# Patient Record
Sex: Female | Born: 1985 | Race: White | Hispanic: No | Marital: Married | Smoking: Current every day smoker
Health system: Southern US, Community
[De-identification: ages and names within clinical notes are randomized; demographics above are authoritative.]

## PROBLEM LIST (undated history)

## (undated) ENCOUNTER — Inpatient Hospital Stay: Payer: Self-pay

## (undated) ENCOUNTER — Inpatient Hospital Stay (HOSPITAL_COMMUNITY): Payer: Self-pay

## (undated) DIAGNOSIS — F17200 Nicotine dependence, unspecified, uncomplicated: Secondary | ICD-10-CM

## (undated) DIAGNOSIS — L0291 Cutaneous abscess, unspecified: Secondary | ICD-10-CM

## (undated) DIAGNOSIS — R519 Headache, unspecified: Secondary | ICD-10-CM

## (undated) DIAGNOSIS — N390 Urinary tract infection, site not specified: Secondary | ICD-10-CM

## (undated) DIAGNOSIS — L0390 Cellulitis, unspecified: Secondary | ICD-10-CM

## (undated) DIAGNOSIS — R011 Cardiac murmur, unspecified: Secondary | ICD-10-CM

## (undated) DIAGNOSIS — R51 Headache: Secondary | ICD-10-CM

## (undated) HISTORY — PX: NO PAST SURGERIES: SHX2092

## (undated) HISTORY — DX: Morbid (severe) obesity due to excess calories: E66.01

## (undated) HISTORY — DX: Nicotine dependence, unspecified, uncomplicated: F17.200

---
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
## 2004-01-07 ENCOUNTER — Encounter: Admission: RE | Admit: 2004-01-07 | Discharge: 2004-01-07 | Payer: Self-pay | Admitting: Family Medicine

## 2004-01-18 ENCOUNTER — Inpatient Hospital Stay (HOSPITAL_COMMUNITY): Admission: AD | Admit: 2004-01-18 | Discharge: 2004-01-18 | Payer: Self-pay | Admitting: Obstetrics and Gynecology

## 2004-01-21 ENCOUNTER — Encounter: Admission: RE | Admit: 2004-01-21 | Discharge: 2004-01-21 | Payer: Self-pay | Admitting: *Deleted

## 2004-02-04 ENCOUNTER — Encounter: Admission: RE | Admit: 2004-02-04 | Discharge: 2004-02-04 | Payer: Self-pay | Admitting: *Deleted

## 2004-02-11 ENCOUNTER — Encounter: Admission: RE | Admit: 2004-02-11 | Discharge: 2004-02-11 | Payer: Self-pay | Admitting: Obstetrics & Gynecology

## 2004-02-13 ENCOUNTER — Inpatient Hospital Stay (HOSPITAL_COMMUNITY): Admission: AD | Admit: 2004-02-13 | Discharge: 2004-02-15 | Payer: Self-pay | Admitting: *Deleted

## 2004-09-11 ENCOUNTER — Emergency Department (HOSPITAL_COMMUNITY): Admission: EM | Admit: 2004-09-11 | Discharge: 2004-09-11 | Payer: Self-pay | Admitting: Emergency Medicine

## 2005-01-13 ENCOUNTER — Other Ambulatory Visit: Admission: RE | Admit: 2005-01-13 | Discharge: 2005-01-13 | Payer: Self-pay | Admitting: Family Medicine

## 2005-03-24 ENCOUNTER — Encounter: Admission: RE | Admit: 2005-03-24 | Discharge: 2005-03-24 | Payer: Self-pay | Admitting: Family Medicine

## 2005-05-06 ENCOUNTER — Emergency Department (HOSPITAL_COMMUNITY): Admission: EM | Admit: 2005-05-06 | Discharge: 2005-05-06 | Payer: Self-pay | Admitting: Emergency Medicine

## 2005-08-15 ENCOUNTER — Encounter: Admission: RE | Admit: 2005-08-15 | Discharge: 2005-08-15 | Payer: Self-pay | Admitting: Family Medicine

## 2006-02-21 ENCOUNTER — Emergency Department (HOSPITAL_COMMUNITY): Admission: EM | Admit: 2006-02-21 | Discharge: 2006-02-21 | Payer: Self-pay | Admitting: Family Medicine

## 2006-02-26 ENCOUNTER — Ambulatory Visit: Payer: Self-pay | Admitting: Family Medicine

## 2006-03-14 ENCOUNTER — Ambulatory Visit: Payer: Self-pay | Admitting: Family Medicine

## 2006-03-16 ENCOUNTER — Ambulatory Visit: Payer: Self-pay | Admitting: Family Medicine

## 2006-03-16 ENCOUNTER — Other Ambulatory Visit: Admission: RE | Admit: 2006-03-16 | Discharge: 2006-03-16 | Payer: Self-pay | Admitting: Family Medicine

## 2006-03-16 ENCOUNTER — Ambulatory Visit: Payer: Self-pay | Admitting: Internal Medicine

## 2006-04-03 ENCOUNTER — Ambulatory Visit: Payer: Self-pay | Admitting: Family Medicine

## 2006-04-05 ENCOUNTER — Encounter: Admission: RE | Admit: 2006-04-05 | Discharge: 2006-04-05 | Payer: Self-pay | Admitting: Family Medicine

## 2006-04-23 ENCOUNTER — Ambulatory Visit: Payer: Self-pay | Admitting: Family Medicine

## 2006-06-13 ENCOUNTER — Ambulatory Visit: Payer: Self-pay | Admitting: Family Medicine

## 2006-06-20 ENCOUNTER — Ambulatory Visit: Payer: Self-pay | Admitting: Family Medicine

## 2006-07-20 ENCOUNTER — Ambulatory Visit: Payer: Self-pay | Admitting: Family Medicine

## 2006-07-20 ENCOUNTER — Ambulatory Visit (HOSPITAL_COMMUNITY): Admission: RE | Admit: 2006-07-20 | Discharge: 2006-07-20 | Payer: Self-pay | Admitting: Otolaryngology

## 2006-09-19 ENCOUNTER — Ambulatory Visit: Payer: Self-pay | Admitting: Family Medicine

## 2006-10-10 ENCOUNTER — Ambulatory Visit: Payer: Self-pay | Admitting: Vascular Surgery

## 2006-10-10 ENCOUNTER — Ambulatory Visit: Admission: RE | Admit: 2006-10-10 | Discharge: 2006-10-10 | Payer: Self-pay | Admitting: Obstetrics and Gynecology

## 2006-11-06 ENCOUNTER — Ambulatory Visit (HOSPITAL_COMMUNITY): Admission: RE | Admit: 2006-11-06 | Discharge: 2006-11-06 | Payer: Self-pay | Admitting: Obstetrics and Gynecology

## 2006-12-31 ENCOUNTER — Inpatient Hospital Stay (HOSPITAL_COMMUNITY): Admission: AD | Admit: 2006-12-31 | Discharge: 2007-01-01 | Payer: Self-pay | Admitting: Obstetrics and Gynecology

## 2007-01-10 ENCOUNTER — Inpatient Hospital Stay (HOSPITAL_COMMUNITY): Admission: AD | Admit: 2007-01-10 | Discharge: 2007-01-10 | Payer: Self-pay | Admitting: Obstetrics & Gynecology

## 2007-01-12 ENCOUNTER — Inpatient Hospital Stay (HOSPITAL_COMMUNITY): Admission: AD | Admit: 2007-01-12 | Discharge: 2007-01-13 | Payer: Self-pay | Admitting: Obstetrics and Gynecology

## 2007-01-17 ENCOUNTER — Ambulatory Visit: Payer: Self-pay | Admitting: Family Medicine

## 2007-04-09 ENCOUNTER — Ambulatory Visit: Payer: Self-pay | Admitting: Family Medicine

## 2007-04-09 ENCOUNTER — Other Ambulatory Visit: Admission: RE | Admit: 2007-04-09 | Discharge: 2007-04-09 | Payer: Self-pay | Admitting: Family Medicine

## 2007-05-15 ENCOUNTER — Ambulatory Visit: Payer: Self-pay | Admitting: Family Medicine

## 2007-05-27 ENCOUNTER — Ambulatory Visit: Payer: Self-pay | Admitting: Family Medicine

## 2007-08-05 ENCOUNTER — Ambulatory Visit: Payer: Self-pay | Admitting: Family Medicine

## 2007-08-20 ENCOUNTER — Ambulatory Visit: Payer: Self-pay | Admitting: Family Medicine

## 2007-10-14 ENCOUNTER — Ambulatory Visit: Payer: Self-pay | Admitting: Family Medicine

## 2007-12-18 ENCOUNTER — Ambulatory Visit: Payer: Self-pay | Admitting: Family Medicine

## 2008-02-14 ENCOUNTER — Emergency Department (HOSPITAL_COMMUNITY): Admission: EM | Admit: 2008-02-14 | Discharge: 2008-02-14 | Payer: Self-pay | Admitting: Emergency Medicine

## 2008-02-19 ENCOUNTER — Ambulatory Visit: Payer: Self-pay | Admitting: Family Medicine

## 2008-02-20 ENCOUNTER — Encounter: Admission: RE | Admit: 2008-02-20 | Discharge: 2008-02-20 | Payer: Self-pay | Admitting: Family Medicine

## 2008-10-15 ENCOUNTER — Emergency Department (HOSPITAL_COMMUNITY): Admission: EM | Admit: 2008-10-15 | Discharge: 2008-10-15 | Payer: Self-pay | Admitting: Emergency Medicine

## 2008-10-15 ENCOUNTER — Ambulatory Visit: Payer: Self-pay | Admitting: Family Medicine

## 2008-10-19 ENCOUNTER — Ambulatory Visit: Payer: Self-pay | Admitting: Family Medicine

## 2009-04-11 ENCOUNTER — Emergency Department (HOSPITAL_COMMUNITY): Admission: EM | Admit: 2009-04-11 | Discharge: 2009-04-11 | Payer: Self-pay | Admitting: Family Medicine

## 2009-04-12 ENCOUNTER — Ambulatory Visit: Payer: Self-pay | Admitting: Family Medicine

## 2009-06-09 ENCOUNTER — Emergency Department (HOSPITAL_COMMUNITY): Admission: EM | Admit: 2009-06-09 | Discharge: 2009-06-09 | Payer: Self-pay | Admitting: Family Medicine

## 2009-07-17 ENCOUNTER — Emergency Department: Payer: Self-pay | Admitting: Emergency Medicine

## 2009-10-12 ENCOUNTER — Emergency Department (HOSPITAL_COMMUNITY): Admission: EM | Admit: 2009-10-12 | Discharge: 2009-10-12 | Payer: Self-pay | Admitting: Family Medicine

## 2010-01-17 ENCOUNTER — Emergency Department (HOSPITAL_COMMUNITY): Admission: EM | Admit: 2010-01-17 | Discharge: 2010-01-17 | Payer: Self-pay | Admitting: Emergency Medicine

## 2010-01-19 ENCOUNTER — Emergency Department: Payer: Self-pay | Admitting: Emergency Medicine

## 2010-01-26 ENCOUNTER — Emergency Department (HOSPITAL_COMMUNITY): Admission: EM | Admit: 2010-01-26 | Discharge: 2010-01-26 | Payer: Self-pay | Admitting: Emergency Medicine

## 2010-02-01 ENCOUNTER — Emergency Department (HOSPITAL_COMMUNITY): Admission: EM | Admit: 2010-02-01 | Discharge: 2010-02-01 | Payer: Self-pay | Admitting: Emergency Medicine

## 2010-02-08 ENCOUNTER — Emergency Department: Payer: Self-pay | Admitting: Unknown Physician Specialty

## 2010-04-11 ENCOUNTER — Emergency Department: Payer: Self-pay | Admitting: Emergency Medicine

## 2010-04-16 ENCOUNTER — Emergency Department: Payer: Self-pay | Admitting: Internal Medicine

## 2010-05-14 ENCOUNTER — Emergency Department: Payer: Self-pay | Admitting: Emergency Medicine

## 2010-06-13 ENCOUNTER — Emergency Department: Payer: Self-pay | Admitting: Unknown Physician Specialty

## 2010-07-06 ENCOUNTER — Emergency Department: Payer: Self-pay | Admitting: Emergency Medicine

## 2010-07-13 ENCOUNTER — Emergency Department: Payer: Self-pay | Admitting: Emergency Medicine

## 2010-07-25 ENCOUNTER — Emergency Department (HOSPITAL_COMMUNITY)
Admission: EM | Admit: 2010-07-25 | Discharge: 2010-07-25 | Payer: Self-pay | Source: Home / Self Care | Admitting: Emergency Medicine

## 2010-07-26 ENCOUNTER — Emergency Department: Payer: Self-pay | Admitting: Unknown Physician Specialty

## 2010-07-27 ENCOUNTER — Emergency Department: Payer: Self-pay | Admitting: Emergency Medicine

## 2010-08-16 ENCOUNTER — Emergency Department: Payer: Self-pay | Admitting: Emergency Medicine

## 2010-08-21 ENCOUNTER — Encounter: Payer: Self-pay | Admitting: Family Medicine

## 2010-10-10 LAB — CBC
HCT: 40 % (ref 36.0–46.0)
Hemoglobin: 13.7 g/dL (ref 12.0–15.0)
MCHC: 34.3 g/dL (ref 30.0–36.0)
RBC: 4.44 MIL/uL (ref 3.87–5.11)
RDW: 12.7 % (ref 11.5–15.5)

## 2010-10-10 LAB — ANA: Anti Nuclear Antibody(ANA): NEGATIVE

## 2010-10-10 LAB — BASIC METABOLIC PANEL
Creatinine, Ser: 0.72 mg/dL (ref 0.4–1.2)
GFR calc Af Amer: >60 mL/min (ref >60)
Potassium: 3.9 mEq/L (ref 3.5–5.1)

## 2010-10-10 LAB — DIFFERENTIAL
Basophils Relative: 0 % (ref 0–1)
Lymphocytes Relative: 19 % (ref 12–46)
Neutro Abs: 6.8 10*3/uL (ref 1.7–7.7)

## 2010-10-23 LAB — URINE CULTURE: Colony Count: >=100000

## 2010-10-23 LAB — POCT URINALYSIS DIP (DEVICE)
Bilirubin Urine: NEGATIVE
Ketones, ur: NEGATIVE mg/dL

## 2010-10-23 LAB — POCT PREGNANCY, URINE: Preg Test, Ur: NEGATIVE

## 2010-11-04 LAB — POCT URINALYSIS DIP (DEVICE)
Glucose, UA: NEGATIVE mg/dL
Protein, ur: NEGATIVE mg/dL
Specific Gravity, Urine: 1.01 (ref 1.005–1.030)
pH: 7 (ref 5.0–8.0)

## 2010-12-16 NOTE — Discharge Summary (Signed)
NAME:  Eileen Tate, Eileen Tate                             ACCOUNT NO.:  [id number]   MEDICAL RECORD NO.:  [id number]                   PATIENT TYPE:  INP   LOCATION:  9106                                 FACILITY:  [facility]   PHYSICIAN:  Lesly Dukes, M.D.              DATE OF BIRTH:  [birth year]   DATE OF ADMISSION:  12/21/2003  DATE OF DISCHARGE:  12/25/2003                                 DISCHARGE SUMMARY   ATTENDING:  Lesly Dukes, M.D.   INTERN:  Ursula Beath, M.D.   DISCHARGE MEDICATIONS:  1. Prenatal vitamins one p.o. daily.  2. Procardia XL 30 mg one p.o. b.i.d.  3. Augmentin 875 mg one p.o. b.i.d. for 6 days.   FOLLOW-UP:  At 10 a.m. next Thursday at the high risk clinic - the date of  that is December 31, 2003.   DISCHARGE INSTRUCTIONS:  Bedrest with bathroom privileges and no sexual  intercourse or anything that may cause you to have an orgasm.   DIET:  Regular.   DISCHARGE DIAGNOSES:  1. Intrauterine pregnancy at discharge 30 weeks.  2. Preterm labor.  3. Cervicitis.   PROCEDURES:  Unasyn.  The patient received 5 days of Unasyn in-hospital and  bedrest as well as fetal monitoring.  The patient underwent betamethasone  treatment.  She received a full course of two doses during her  hospitalization.   HISTORY OF PRESENT ILLNESS:  This is an 25 year old G1 P0 presenting at 46  and 4 weeks from clinic with a 1-week history of lower abdominal cramping-  type pain and back pain.  She has increased vaginal discharge and occasional  dysuria.  She is noted to be GBS positive for which she has been treated and  to be rubella immune.  She is taking Flintstones vitamins with iron and she  is allergic to [facility].   PRENATAL LABORATORY DATA:  Mother is A negative, antibody negative.  Hematocrit of 34.2, platelets 237.  She is rubella nonimmune.  Hepatitis B  surface antigen negative.  Syphilis negative.  HIV was declined.  GC and  chlamydia negative.  GBS positive on November 25, 2003 and she was treated with  Penicillin VK.  Her 1-hour GCT was 68.   PHYSICAL EXAMINATION:  VITAL SIGNS:  Temperature 98.7, pulse 80,  respirations 16, blood pressure 113/63.  GENERAL:  She is in no acute distress.  HEART:  Regular rate and rhythm with a 1/6 systolic ejection murmur.  LUNGS:  Clear to auscultation bilaterally.  ABDOMEN:  Gravid, soft, nontender, with normal active bowel sounds.  EXTREMITIES:  Without edema and she has 1+ DTRs.  PELVIC:  On speculum exam she has moderate mucoid discharge and her cervix  was long, thick, and closed.  At discharge it should be noted that her  cervix is closed, approximately 2 cm long, vertex, with a developing lower  segment; this is per  Dr. Gavin Potters.   The patient underwent appropriate antibiotic therapy and will be continued  as an outpatient.  Her contractions ceased during her hospitalization and  the fetus continued to have a good strip during her hospitalization.  She  was begun on Procardia as an outpatient for preterm labor symptoms and will  be continued on Augmentin for a total 10-day course of antibiotics.  She  will return to the high risk clinic for follow-up.     Ursula Beath, MD                     Lesly Dukes, M.D.    JT/MEDQ  D:  12/25/2003  T:  12/26/2003  Job:  [id number]

## 2010-12-21 ENCOUNTER — Emergency Department (HOSPITAL_COMMUNITY)
Admission: EM | Admit: 2010-12-21 | Discharge: 2010-12-21 | Disposition: A | Discharge status: Home / Self Care | Payer: Self-pay | Attending: Emergency Medicine | Admitting: Emergency Medicine

## 2010-12-21 ENCOUNTER — Emergency Department (HOSPITAL_COMMUNITY): Payer: Self-pay

## 2010-12-21 DIAGNOSIS — M546 Pain in thoracic spine: Secondary | ICD-10-CM | POA: Insufficient documentation

## 2010-12-21 DIAGNOSIS — J40 Bronchitis, not specified as acute or chronic: Secondary | ICD-10-CM | POA: Insufficient documentation

## 2010-12-21 DIAGNOSIS — R05 Cough: Secondary | ICD-10-CM | POA: Insufficient documentation

## 2010-12-21 DIAGNOSIS — R059 Cough, unspecified: Secondary | ICD-10-CM | POA: Insufficient documentation

## 2011-02-08 ENCOUNTER — Inpatient Hospital Stay (INDEPENDENT_AMBULATORY_CARE_PROVIDER_SITE_OTHER)
Admission: RE | Admit: 2011-02-08 | Discharge: 2011-02-08 | Disposition: A | Discharge status: Home / Self Care | Payer: Medicaid Other | Source: Ambulatory Visit | Attending: Family Medicine | Admitting: Family Medicine

## 2011-02-08 DIAGNOSIS — L03317 Cellulitis of buttock: Secondary | ICD-10-CM

## 2011-03-07 ENCOUNTER — Inpatient Hospital Stay (INDEPENDENT_AMBULATORY_CARE_PROVIDER_SITE_OTHER)
Admission: RE | Admit: 2011-03-07 | Discharge: 2011-03-07 | Disposition: A | Discharge status: Home / Self Care | Payer: Medicaid Other | Source: Ambulatory Visit | Attending: Family Medicine | Admitting: Family Medicine

## 2011-03-07 DIAGNOSIS — L0231 Cutaneous abscess of buttock: Secondary | ICD-10-CM

## 2011-03-07 DIAGNOSIS — L03317 Cellulitis of buttock: Secondary | ICD-10-CM

## 2011-03-21 ENCOUNTER — Encounter: Payer: Self-pay | Admitting: Family Medicine

## 2011-03-21 ENCOUNTER — Ambulatory Visit (INDEPENDENT_AMBULATORY_CARE_PROVIDER_SITE_OTHER): Payer: Medicaid Other | Admitting: Family Medicine

## 2011-03-21 VITALS — BP 110/80 | HR 111 | Ht 61.0 in | Wt 149.0 lb

## 2011-03-21 DIAGNOSIS — Z0000 Encounter for general adult medical examination without abnormal findings: Secondary | ICD-10-CM

## 2011-03-21 NOTE — Progress Notes (Signed)
  Subjective:    Patient ID: Eileen Tate, female    DOB: 1986/01/28, 25 y.o.   MRN: [id number]  HPI She is here for preemployment evaluation. 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 She has had no difficulty with sight or hearing. She has not had any trouble with her back.   Review of Systems     Objective:   Physical Exam Alert and in no distress. Vision and hearing are grossly intact. Cardiac exam shows regular rhythm without murmurs or gallops. Lungs clear to auscultation.       Assessment & Plan:  Normal exam PPD will be applied

## 2011-03-23 LAB — TB SKIN TEST
Induration: 0
TB Skin Test: NEGATIVE mm

## 2011-04-06 ENCOUNTER — Telehealth: Payer: Self-pay | Admitting: Family Medicine

## 2011-04-06 NOTE — Telephone Encounter (Signed)
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 Lennox Solders [phone]

## 2011-04-26 ENCOUNTER — Inpatient Hospital Stay (HOSPITAL_COMMUNITY)
Admission: RE | Admit: 2011-04-26 | Discharge: 2011-04-26 | Disposition: A | Discharge status: Home / Self Care | Payer: Self-pay | Source: Ambulatory Visit | Attending: Family Medicine | Admitting: Family Medicine

## 2011-04-27 ENCOUNTER — Inpatient Hospital Stay (INDEPENDENT_AMBULATORY_CARE_PROVIDER_SITE_OTHER)
Admission: RE | Admit: 2011-04-27 | Discharge: 2011-04-27 | Disposition: A | Discharge status: Home / Self Care | Payer: Medicaid Other | Source: Ambulatory Visit | Attending: Family Medicine | Admitting: Family Medicine

## 2011-04-27 DIAGNOSIS — L0231 Cutaneous abscess of buttock: Secondary | ICD-10-CM

## 2011-04-28 LAB — POCT PREGNANCY, URINE
Operator id: 239701
Preg Test, Ur: NEGATIVE

## 2011-05-17 LAB — RH IMMUNE GLOB WKUP(>/=20WKS)(NOT WOMEN'S HOSP): Fetal Screen: NEGATIVE

## 2011-05-17 LAB — CBC
MCHC: 33.8
MCV: 86.2
Platelets: 196
RBC: 3.02 — ABNORMAL LOW
RDW: 14

## 2011-05-18 LAB — CBC
MCHC: 33.6
Platelets: 206
WBC: 11.1 — ABNORMAL HIGH

## 2011-05-18 LAB — WET PREP, GENITAL: Clue Cells Wet Prep HPF POC: NONE SEEN

## 2011-07-11 ENCOUNTER — Encounter (HOSPITAL_COMMUNITY): Payer: Self-pay | Admitting: *Deleted

## 2011-07-11 ENCOUNTER — Emergency Department (INDEPENDENT_AMBULATORY_CARE_PROVIDER_SITE_OTHER)
Admission: EM | Admit: 2011-07-11 | Discharge: 2011-07-11 | Disposition: A | Discharge status: Home / Self Care | Payer: Medicaid Other | Source: Home / Self Care | Attending: Emergency Medicine | Admitting: Emergency Medicine

## 2011-07-11 DIAGNOSIS — L0390 Cellulitis, unspecified: Secondary | ICD-10-CM

## 2011-07-11 DIAGNOSIS — L0291 Cutaneous abscess, unspecified: Secondary | ICD-10-CM

## 2011-07-11 DIAGNOSIS — M25569 Pain in unspecified knee: Secondary | ICD-10-CM

## 2011-07-11 HISTORY — DX: Cutaneous abscess, unspecified: L02.91

## 2011-07-11 HISTORY — DX: Cellulitis, unspecified: L03.90

## 2011-07-11 MED ORDER — MELOXICAM 15 MG PO TABS: 15.0000 mg | ORAL_TABLET | Freq: Every day | ORAL | Status: DC

## 2011-07-11 MED ORDER — MUPIROCIN CALCIUM 2 % EX CREA: TOPICAL_CREAM | Freq: Three times a day (TID) | CUTANEOUS | Status: AC

## 2011-07-11 NOTE — ED Provider Notes (Signed)
History     CSN: [id number] Arrival date & time: 07/11/2011  3:50 PM   First MD Initiated Contact with Patient 07/11/11 1603      Chief Complaint  Patient presents with  . Leg Swelling    HPI Comments: Pt with pain at left medial buttock  X several days. No N/V, fevers, redness, drainage, swelling. No recent trauma to area.. Identical pain in this area 2x before- was found to have cellulitis/abscess. Had I&D done in July. Seen 9/27 for identical sx no I&D done sent home on doxy and NSAIDs. Pt also c/o achy left knee pain behind patella worse with sitting for prolonged periods of time and going up stairs starting several days ago.  Is taking aleve 3-4 times/day for this w/o relief. Wearing knee sleeve starting yesterday with improvement. States gets this pain intermittently after being out in yard playing with her children or when the weather gets cold. H/o injury to this knee several months ago, which resolved.  No redness, swelling, rash, parasthesias, limitation of motion.     Past Medical History  Diagnosis Date  . Abscess and cellulitis     History reviewed. No pertinent past surgical history.  History reviewed. No pertinent family history.  History  Substance Use Topics  . Smoking status: Current Everyday Smoker  . Smokeless tobacco: Never Used  . Alcohol Use: No    OB History    Grav Para Term Preterm Abortions TAB SAB Ect Mult Living                  Review of Systems  Constitutional: Negative for fever.  Gastrointestinal: Negative for nausea and vomiting.  Musculoskeletal: Positive for arthralgias. Negative for joint swelling.  Skin: Negative for color change, rash and wound.  Neurological: Negative for weakness and numbness.    Allergies  Review of patient's allergies indicates no known allergies.  Home Medications   Current Outpatient Rx  Name Route Sig Dispense Refill  . MELOXICAM 15 MG PO TABS Oral Take 1 tablet (15 mg total) by mouth daily. 14 tablet  0  . MUPIROCIN CALCIUM 2 % EX CREA Topical Apply topically 3 (three) times daily. 15 g 0    BP 102/67  Pulse 80  Temp(Src) 98 F (36.7 C) (Oral)  Resp 16  SpO2 98%  LMP 06/05/2011  Physical Exam  Nursing note and vitals reviewed. Constitutional: She is oriented to person, place, and time. She appears well-developed and well-nourished. No distress.  HENT:  Head: Normocephalic and atraumatic.  Eyes: Conjunctivae and EOM are normal. Pupils are equal, round, and reactive to light.  Neck: Normal range of motion.  Cardiovascular: Regular rhythm.   Pulmonary/Chest: Effort normal and breath sounds normal.  Abdominal: Soft. She exhibits no distension.  Musculoskeletal: Normal range of motion.       Knee ROM baseline for PT, Flexion, extension intact Patella NT, Patellar apprehension test negative, Patellar tendon NT, Medial joint NT, Lateral joint NT, Popliteal region NT, Lachman's stable, Varus stress testing stable, Valgus stress testing stable, McMurray's testing normal, distal NVI with intact baseline sensation / motor / pulse distal to knee. No crepitus, effusion   Neurological: She is alert and oriented to person, place, and time.  Skin: Skin is warm and dry.       1.5 x 1.5 cm area tenderness left medial buttock in gluteal cleft. Tender skin fissure. No induration, fluctuance, redness.   Psychiatric: She has a normal mood and affect. Her behavior is normal.  Judgment and thought content normal.    ED Course  Procedures (including critical care time)  Labs Reviewed - No data to display No results found.   1. Cellulitis   2. Anterior knee pain       MDM  Nothing to I&D at this time. appears to be skin fissure from where undergarments rub. Will start bactroban have her keep it covered until healed. D/w to return to ED.      Luiz Blare, MD 07/11/11 [phone]

## 2011-07-11 NOTE — ED Notes (Signed)
pT  SEEN  APPROX  1  MONTH  AGO  FOR  POSIBLE  ABCESS  L  UPPER  LEG    NEAR  BUTTOCK       SHE  REPORTS  THE     SORE   HA S  RETURNED  AND  IS CAUSING  PAIN     POSSIBLY INFECTED         SHE  DENYS  ANY  OTHER  SYMPTOMS  AT THIS  TIME

## 2011-07-12 ENCOUNTER — Telehealth: Payer: Self-pay

## 2011-07-12 NOTE — Telephone Encounter (Signed)
Called pt to see how she was she said still hurting the reason for not coming in here was she has no ins and she said that urgent care work with her on payments

## 2011-07-13 ENCOUNTER — Telehealth (HOSPITAL_COMMUNITY): Payer: Self-pay | Admitting: *Deleted

## 2011-09-04 ENCOUNTER — Encounter (HOSPITAL_COMMUNITY): Payer: Self-pay

## 2011-09-04 ENCOUNTER — Emergency Department (INDEPENDENT_AMBULATORY_CARE_PROVIDER_SITE_OTHER)
Admission: EM | Admit: 2011-09-04 | Discharge: 2011-09-04 | Disposition: A | Discharge status: Home / Self Care | Payer: Medicaid Other | Source: Home / Self Care

## 2011-09-04 DIAGNOSIS — L089 Local infection of the skin and subcutaneous tissue, unspecified: Secondary | ICD-10-CM

## 2011-09-04 DIAGNOSIS — B958 Unspecified staphylococcus as the cause of diseases classified elsewhere: Secondary | ICD-10-CM

## 2011-09-04 LAB — GLUCOSE, CAPILLARY: Glucose-Capillary: 84 mg/dL (ref 70–99)

## 2011-09-04 MED ORDER — IBUPROFEN 800 MG PO TABS
800.0000 mg | ORAL_TABLET | Freq: Once | ORAL | Status: AC
  Administered 2011-09-04: 800 mg via ORAL

## 2011-09-04 MED ORDER — IBUPROFEN 800 MG PO TABS: 800.0000 mg | ORAL_TABLET | Freq: Three times a day (TID) | ORAL | Status: AC

## 2011-09-04 NOTE — Discharge Instructions (Signed)
You will be notified if your blood test is abnormal. We will not call you with a negative result.  Wash the area of discomfort with soap and water once a day. Keep the area clean and apply an over the counter triple antibiotic ointment 2-3 times a day. Follow up with your primary care dr if symptoms change or worsen.

## 2011-09-04 NOTE — ED Provider Notes (Signed)
History     CSN: [id number]  Arrival date & time 09/04/11  1637   None     Chief Complaint  Patient presents with  . Rash    (Consider location/radiation/quality/duration/timing/severity/associated sxs/prior treatment) HPI Comments: Pt states that she has had recurrent sores on the same spot on her left medial buttock, 3-4 times in the last 2 mos. It starts as a bump then feels like it peels and becomes an open raw area for a couple of days before healing. She did have an abscess in this area drained July of last yr. She was most recently seen 07-11-11 for the same, prescribed bactroban ointment which she states did seem to help, but was expensive and she is uninsured. The pain radiates down her left left from this sore area on her buttock. She also is requesting to be checked for diabetes stating that she has been drinking 12 or more bottles of water a day for the last 2 days and is still thirsty.    Past Medical History  Diagnosis Date  . Abscess and cellulitis     History reviewed. No pertinent past surgical history.  History reviewed. No pertinent family history.  History  Substance Use Topics  . Smoking status: Current Everyday Smoker  . Smokeless tobacco: Never Used  . Alcohol Use: No    OB History    Grav Para Term Preterm Abortions TAB SAB Ect Mult Living                  Review of Systems  Constitutional: Negative for fever and chills.  Musculoskeletal: Negative for myalgias, back pain and joint swelling.    Allergies  Review of patient's allergies indicates no known allergies.  Home Medications   Current Outpatient Rx  Name Route Sig Dispense Refill  . IBUPROFEN 800 MG PO TABS Oral Take 1 tablet (800 mg total) by mouth 3 (three) times daily. 15 tablet 0    BP 114/65  Pulse 79  Temp(Src) 98 F (36.7 C) (Oral)  Resp 18  SpO2 100%  LMP 08/08/2011  Physical Exam  Nursing note and vitals reviewed. Constitutional: She appears well-developed and  well-nourished. No distress.  Cardiovascular: Normal rate, regular rhythm and normal heart sounds.   Pulmonary/Chest: Effort normal and breath sounds normal. No respiratory distress.  Musculoskeletal: Normal range of motion. She exhibits no edema.  Neurological: She is alert.  Skin: Skin is warm and dry.       Medial Lt buttock 1 cm mildly erythematous linear area with scab at inferior portion. No vesicles or pustules. No induration or nodule.   Psychiatric: She has a normal mood and affect.    ED Course  Procedures (including critical care time)   Labs Reviewed  GLUCOSE, CAPILLARY  HSV 2 ANTIBODY, IGG   No results found.   1. Staph skin infection       MDM  HSV test obtained - hx of open painful skin lesion, recurring in same area last 2 mos. No visible or palpable abscess.          Melody Comas, [place] 09/04/11 1912

## 2011-09-04 NOTE — ED Notes (Signed)
C/o recurrent sore place on left leg/thigh/buttocks

## 2011-09-05 NOTE — ED Provider Notes (Signed)
Medical screening examination/treatment/procedure(s) were performed by non-physician practitioner and as supervising physician I was immediately available for consultation/collaboration.   [facility]; MD   Sharin Grave, MD 09/05/11 [phone]

## 2011-09-13 ENCOUNTER — Telehealth (HOSPITAL_COMMUNITY): Payer: Self-pay | Admitting: *Deleted

## 2011-09-13 NOTE — ED Notes (Signed)
Pt. called and said she feels tired, weak, aches all over and sore spot on her leg is not going away. Pt. states she has been here for this 2-3 times and can't afford to keep coming. Pt. asked the result of her Herpes test. Pt. verified x 2 and told HSV 2 was neg. 0.10.  I told her I would talk to [facility] PA and would call back. Dawn ordered Doxycycline 100 mg. BID x 7 days and said to tell her this is for skin infection. She does not think the other symptoms are related to the sore on her leg and need to be worked up. If pt. does not have a PCP tell her to f/u with [facility] @ [phone].  1300 I called pt. back and gave her this information. Pt. does not have insurance or PCP. I explained that [facility] will does their own eligibility and gave her the phone number. Pt. wants Rx. called to [facility] on [address]. She asked how much it costs. I told her I did not know.  She said she can't fill it until she gets paid on Fri. Rx. called to pharmacist @ [phone]. Vassie Moselle 09/13/2011

## 2011-10-09 ENCOUNTER — Other Ambulatory Visit: Payer: Self-pay

## 2011-10-09 ENCOUNTER — Emergency Department (HOSPITAL_COMMUNITY): Payer: Self-pay

## 2011-10-09 ENCOUNTER — Encounter (HOSPITAL_COMMUNITY): Payer: Self-pay

## 2011-10-09 ENCOUNTER — Emergency Department (HOSPITAL_COMMUNITY)
Admission: EM | Admit: 2011-10-09 | Discharge: 2011-10-09 | Disposition: A | Discharge status: Home / Self Care | Payer: Self-pay | Attending: Emergency Medicine | Admitting: Emergency Medicine

## 2011-10-09 DIAGNOSIS — R079 Chest pain, unspecified: Secondary | ICD-10-CM | POA: Insufficient documentation

## 2011-10-09 DIAGNOSIS — I498 Other specified cardiac arrhythmias: Secondary | ICD-10-CM | POA: Insufficient documentation

## 2011-10-09 DIAGNOSIS — R0789 Other chest pain: Secondary | ICD-10-CM

## 2011-10-09 DIAGNOSIS — F17200 Nicotine dependence, unspecified, uncomplicated: Secondary | ICD-10-CM | POA: Insufficient documentation

## 2011-10-09 DIAGNOSIS — R071 Chest pain on breathing: Secondary | ICD-10-CM | POA: Insufficient documentation

## 2011-10-09 LAB — POCT I-STAT, CHEM 8
Calcium, Ion: 1.21 mmol/L (ref 1.12–1.32)
Chloride: 107 mEq/L (ref 96–112)
Glucose, Bld: 117 mg/dL — ABNORMAL HIGH (ref 70–99)
HCT: 46 % (ref 36.0–46.0)
Hemoglobin: 15.6 g/dL — ABNORMAL HIGH (ref 12.0–15.0)

## 2011-10-09 LAB — POCT PREGNANCY, URINE: Preg Test, Ur: NEGATIVE

## 2011-10-09 MED ORDER — DIPHENHYDRAMINE HCL 25 MG PO CAPS
25.0000 mg | ORAL_CAPSULE | Freq: Once | ORAL | Status: AC
  Administered 2011-10-09: 25 mg via ORAL
  Filled 2011-10-09: qty 1

## 2011-10-09 MED ORDER — HYDROCODONE-ACETAMINOPHEN 5-325 MG PO TABS
1.0000 | ORAL_TABLET | Freq: Once | ORAL | Status: AC
  Administered 2011-10-09: 1 via ORAL
  Filled 2011-10-09: qty 1

## 2011-10-09 MED ORDER — IOHEXOL 350 MG/ML SOLN
100.0000 mL | Freq: Once | INTRAVENOUS | Status: AC | PRN
  Administered 2011-10-09: 100 mL via INTRAVENOUS

## 2011-10-09 MED ORDER — KETOROLAC TROMETHAMINE 60 MG/2ML IM SOLN
60.0000 mg | Freq: Once | INTRAMUSCULAR | Status: AC
  Administered 2011-10-09: 60 mg via INTRAMUSCULAR
  Filled 2011-10-09: qty 2

## 2011-10-09 MED ORDER — IBUPROFEN 600 MG PO TABS: 600.0000 mg | ORAL_TABLET | Freq: Four times a day (QID) | ORAL | Status: AC | PRN

## 2011-10-09 NOTE — ED Provider Notes (Signed)
History     CSN: [id number]  Arrival date & time 10/09/11  1508   First MD Initiated Contact with Patient 10/09/11 1816      Chief Complaint  Patient presents with  . Chest Pain    (Consider location/radiation/quality/duration/timing/severity/associated sxs/prior treatment) Patient is a 26 y.o. female presenting with chest pain. The history is provided by the patient.  Chest Pain The chest pain began 2 days ago. Duration of episode(s) is 2 days. Chest pain occurs intermittently. The chest pain is unchanged. The pain is associated with lifting. The pain is currently at 0/10. The severity of the pain is mild. The quality of the pain is described as stabbing. The pain radiates to the right arm. Exacerbated by: lifting and bending forward. Pertinent negatives for primary symptoms include no fever, no syncope, no shortness of breath, no cough, no abdominal pain, no nausea, no vomiting and no dizziness.  Pertinent negatives for associated symptoms include no diaphoresis. She tried nothing for the symptoms. Risk factors include smoking/tobacco exposure.  Her family medical history is significant for CAD in family (grandparents with MIs in their 32s).     Past Medical History  Diagnosis Date  . Abscess and cellulitis     History reviewed. No pertinent past surgical history.  No family history on file.  History  Substance Use Topics  . Smoking status: Current Everyday Smoker  . Smokeless tobacco: Never Used  . Alcohol Use: No    OB History    Grav Para Term Preterm Abortions TAB SAB Ect Mult Living                  Review of Systems  Constitutional: Negative for fever and diaphoresis.  Respiratory: Negative for cough and shortness of breath.   Cardiovascular: Positive for chest pain. Negative for syncope.  Gastrointestinal: Negative for nausea, vomiting and abdominal pain.  Neurological: Negative for dizziness.  All other systems reviewed and are negative.    Allergies    Review of patient's allergies indicates no known allergies.  Home Medications   Current Outpatient Rx  Name Route Sig Dispense Refill  . DIPHENHYDRAMINE HCL 25 MG PO CAPS Oral Take 25 mg by mouth every 6 (six) hours as needed. For allergies      BP 113/82  Pulse 92  Temp(Src) 97.8 F (36.6 C) (Oral)  Resp 17  Ht 4\' 11"  (1.499 m)  Wt 150 lb (68.04 kg)  BMI 30.30 kg/m2  SpO2 99%  LMP 10/09/2011  Physical Exam  Nursing note and vitals reviewed. Constitutional: She is oriented to person, place, and time. She appears well-developed and well-nourished. No distress.  HENT:  Head: Normocephalic and atraumatic.  Mouth/Throat: Oropharynx is clear and moist.  Eyes: EOM are normal. Pupils are equal, round, and reactive to light.  Neck: Normal range of motion. Neck supple.  Cardiovascular: Normal rate, regular rhythm and normal heart sounds.  Exam reveals no friction rub.   No murmur heard. Pulmonary/Chest: Effort normal and breath sounds normal. No respiratory distress. She has no wheezes. She has no rales. She exhibits tenderness (TTP with reproducible pain over central anterior chest. ).  Abdominal: Soft. There is no tenderness. There is no rebound and no guarding.  Musculoskeletal: Normal range of motion. She exhibits no edema and no tenderness.  Lymphadenopathy:    She has no cervical adenopathy.  Neurological: She is alert and oriented to person, place, and time.       5/5 strength BUEs   Skin:  Skin is warm and dry. No rash noted.  Psychiatric: She has a normal mood and affect. Her behavior is normal.    ED Course  Procedures (including critical care time)  Results for orders placed during the hospital encounter of 10/09/11  POCT PREGNANCY, URINE      Component Value Range   Preg Test, Ur NEGATIVE  NEGATIVE   D-DIMER, QUANTITATIVE      Component Value Range   D-Dimer, Quant 1.80 (*) 0.00 - 0.48 (ug/mL-FEU)  POCT I-STAT, CHEM 8      Component Value Range   Sodium 141   135 - 145 (mEq/L)   Potassium 3.8  3.5 - 5.1 (mEq/L)   Chloride 107  96 - 112 (mEq/L)   BUN 6  6 - 23 (mg/dL)   Creatinine, Ser 2.72  0.50 - 1.10 (mg/dL)   Glucose, Bld 536 (*) 70 - 99 (mg/dL)   Calcium, Ion 6.44  0.34 - 1.32 (mmol/L)   TCO2 21  0 - 100 (mmol/L)   Hemoglobin 15.6 (*) 12.0 - 15.0 (g/dL)   HCT 74.2  59.5 - 63.8 (%)    Dg Chest 2 View  10/09/2011  *RADIOLOGY REPORT*  Clinical Data: Left-sided chest pain, shortness of breath, cough  CHEST - 2 VIEW  Comparison: 12/21/2010  Findings: Normal heart size and vascularity.  Negative for pneumonia, edema, collapse, consolidation, effusion or pneumothorax.  Slight scoliosis of the spine evident.  No significant interval change.  IMPRESSION: No acute chest process  Original Report Authenticated By: Judie Petit. Ruel Favors, M.D.   CT/PE study:  IMPRESSION: No pulmonary embolism or acute intrathoracic process identified.   Date: 10/09/2011  Rate: 108  Rhythm: sinus tachycardia  QRS Axis: normal  Intervals: normal  ST/T Wave abnormalities: normal  Conduction Disutrbances:none  Narrative Interpretation:   Old EKG Reviewed: none available     1. Chest pain   2. Chest wall pain       MDM  58:8 PM 26 year old female presenting with a two-day history of centralized stabbing chest pain radiating to right arm. She feels the pain is worse when she lifting things at work or bending forward. She denies shortness of breath, nausea, vomiting or diaphoresis. The pain is not pleuritic. She is not hypoxic or tachypneic. Lungs are clear. She has no risk factors other than tobacco abuse and no strong family history of CAD. Story is atypical for ACS. She is a smoker but is not on birth-control. EKG revealed sinus tachycardia. chest x-ray is unremarkable. This certainly sounds MSK in origin given worsening with lifting at work and reproducibility, but she has also been complaining of left leg pain and is tachy on EKG. Given this will check d dimer.     8:54 PM d dimer elevated. Will check CT/PE study.    11:05 PM  CT/PE study is negative. Overall picture consistent with chest wall pain. Counseled patient on use of high-dose Motrin. She will follow up with her primary physician for reevaluation. Her and her mother are understanding and in agreement with plan. They were both given strong return precautions and discharged home in stable condition.  Sheran Luz, MD 10/10/11 [phone]

## 2011-10-09 NOTE — ED Notes (Signed)
Pt noted to have hives on her abdomen but denies any shortness of breath, no chest pain

## 2011-10-09 NOTE — ED Notes (Signed)
MD at bedside. 

## 2011-10-09 NOTE — ED Provider Notes (Signed)
I saw and evaluated the patient, reviewed the resident's note and I agree with the findings and plan.  I suspect the patient is having chest wall pain. She does have reproducible tenderness on exam. However, she did mention some trouble with leg cramping. We will screen with a d-dimer    Celene Kras, MD 10/09/11 [phone]

## 2011-10-09 NOTE — ED Notes (Signed)
Patient is AOx4 and comfortable with her discharge instructions. 

## 2011-10-09 NOTE — ED Notes (Signed)
2 days ago chest pain began with nausea, headache, dizziness.   Pt. Has a hx anxiety attacks.  The chest pain radiates into her  Rt. Forearm

## 2011-10-10 ENCOUNTER — Encounter: Payer: Self-pay | Admitting: Medical

## 2011-10-10 ENCOUNTER — Ambulatory Visit (INDEPENDENT_AMBULATORY_CARE_PROVIDER_SITE_OTHER): Payer: Medicaid Other | Admitting: Medical

## 2011-10-10 VITALS — BP 102/78 | HR 120 | Temp 98.3°F | Resp 16 | Wt 142.0 lb

## 2011-10-10 DIAGNOSIS — R42 Dizziness and giddiness: Secondary | ICD-10-CM

## 2011-10-10 DIAGNOSIS — H9202 Otalgia, left ear: Secondary | ICD-10-CM

## 2011-10-10 DIAGNOSIS — R079 Chest pain, unspecified: Secondary | ICD-10-CM

## 2011-10-10 DIAGNOSIS — H9209 Otalgia, unspecified ear: Secondary | ICD-10-CM

## 2011-10-10 DIAGNOSIS — I951 Orthostatic hypotension: Secondary | ICD-10-CM

## 2011-10-10 DIAGNOSIS — N920 Excessive and frequent menstruation with regular cycle: Secondary | ICD-10-CM

## 2011-10-10 MED ORDER — AMOXICILLIN 875 MG PO TABS: 875.0000 mg | ORAL_TABLET | Freq: Two times a day (BID) | ORAL | Status: AC

## 2011-10-10 NOTE — Progress Notes (Signed)
  Subjective:   HPI  Eileen Tate is a 26 y.o. female who presents for hospital f/u.  She went to the ED yesterday for chest pain.  She notes that someone spilled bleach on her at work which she is allergy too, then she began having chest pain and was panicky as well.  Was evaluated at the ED, given medication for pain in the ED, was discharged, with script for Ibuprofen 600mg  for chest wall pain.  She didn't have dizziness yesterday, but today has dizziness, off balance if gets up fast, left eye and ear are hurting, feels weak.  All she has drank today was 1 coke, and ate 2 piece of pizza at lunch.  No other aggravating or relieving factors.   Past Medical History  Diagnosis Date  . Abscess and cellulitis   . Asthma   . Tobacco use disorder    The following portions of the patient's history were reviewed and updated as appropriate: allergies, current medications, past family history, past medical history, past social history, past surgical history and problem list.  ROS Gen: +chills, no fever, weight changes, sweats HEENT: no ST, +occasional cough, left +sinus pressure, orbit pain and left ear pain Herat: midline chest pain, no palpitations or edema Lungs: no SOB, wheezing GI: no pain, NVD GU: negative Neuro: no numbness,tingling, slurred speech     Objective:   Physical Exam  General appearance: alert, no distress, WD/WN, fatigued appearing Neuro: A&O x 3, CN2-12 intact, nonfocal, answers questions appropriate HEENT: normocephalic, sclerae anicteric, bilat TMs with erythema and serous fluid, nares patent, no discharge or erythema, pharynx normal Oral cavity: MMM, no lesions Neck: supple, no lymphadenopathy, no thyromegaly, no masses Heart: RRR, normal S1, S2, no murmurs Lungs: CTA bilaterally, no wheezes, rhonchi, or rales Pulses: 2+ symmetric, upper and lower extremities, normal cap refill   Assessment and Plan :     Encounter Diagnoses  Name Primary?  . Otalgia of left  ear Yes  . Dizziness   . Chest pain   . Orthostatic hypotension   . Heavy period    Reviewed ED report, CT chest, CXR, labs from last nights ED visit.  Dx: musculoskeletal chest pain, script for Ibuprofen.   She was given Norco x 2 and benadryl and Toradol in the ED.  Otalgia - likely early otitis media.  In light of sinus pressure, ear pain, begin OTC decongestant, Amoxicillin  Dizziness - dehydrated today.  Advised much hydration over next 24 hours.  She is also having some sedation due to 2 rounds of Hydrocodone and Benadryl last night at the ED.  Rest.    Chest pain - begin Ibuprofen 600mg  as prescribed by the ED.  Heavy periods - normal Hgb today.  Consider daily multivitamin and iron supplement.  Call/return if not improving.  Note for work today and tomorrow.

## 2011-10-10 NOTE — Patient Instructions (Addendum)
Today, you are a little dehydrated, you blood pressure is low, your pulse rate elevated.  You also have some fluid behind the ears.   This all can cause dizziness.   For the next 24 hours, REST!!!   Drink 1 big glass of water every hour until you are peeing clear.  You can also drink some Gatorade or milk, as these have good electrolytes.    Consider taking either Zyrtec 10mg  or Benadryl 25mg  daily or an OTC sinus decongestant. Be aware that decongestants can dry you out even more.  Thus, you really need to rehydrate.  Use the Ibuprofen give by the emergency dept for ear and chest pain.

## 2011-10-11 ENCOUNTER — Encounter: Payer: Self-pay | Admitting: Medical

## 2011-10-11 ENCOUNTER — Inpatient Hospital Stay: Payer: Self-pay | Admitting: Medical

## 2011-10-11 ENCOUNTER — Ambulatory Visit (INDEPENDENT_AMBULATORY_CARE_PROVIDER_SITE_OTHER): Payer: Medicaid Other | Admitting: Medical

## 2011-10-11 DIAGNOSIS — L509 Urticaria, unspecified: Secondary | ICD-10-CM | POA: Insufficient documentation

## 2011-10-11 NOTE — Patient Instructions (Signed)
Diagnosis: Hives/Urticaria - cause unknown   Use Zyrtec 10mg  OTC daily at bedtime  Begin Zantac 150mg  OTC twice daily  Use Dove Sensitive Skin soap for hygiene  Avoid Ibuprofen, Aleve, or other antiinflammatory.  Use Tylenol for pain instead  For now, avoid perfumes, new foods, or things that trigger the hives  Consider moving the bird to another room for now  Stop smoking   Hives Hives (urticaria) are itchy, red, swollen patches on the skin. They may change size, shape, and location quickly and repeatedly. Hives that occur deeper in the skin can cause swelling of the hands, feet, and face. Hives may be an allergic reaction to something you or your child ate, touched, or put on the skin. Hives can also be a reaction to cold, heat, viral infections, medication, insect bites, or emotional stress. Often the cause is hard to find. Hives can come and go for several days to several weeks. Hives are not contagious. HOME CARE INSTRUCTIONS   If the cause of the hives is known, avoid exposure to that source.   To relieve itching and rash:   Apply cold compresses to the skin or take cool water baths. Do not take or give your child hot baths or showers because the warmth will make the itching worse.   The best medicine for hives is an antihistamine. An antihistamine will not cure hives, but it will reduce their severity. You can use an antihistamine available over the counter. This medicine may make your child sleepy. Teenagers should not drive while using this medicine.   Take or give an antihistamine every 6 hours until the hives are completely gone for 24 hours or as directed.   Your child may have other medications prescribed for itching. Give these as directed by your child's caregiver.   You or your child should wear loose fitting clothing, including undergarments. Skin irritations may make hives worse.   Follow-up as directed by your caregiver.  SEEK MEDICAL CARE IF:   You or your  child still have considerable itching after taking the medication (prescribed or purchased over the counter).   Joint swelling or pain occurs.  SEEK IMMEDIATE MEDICAL CARE IF:   You have a fever.   Swollen lips or tongue are noticed.   There is difficulty with breathing, swallowing, or tightness in the throat or chest.   Abdominal pain develops.   Your child starts acting very sick.  These may be the first signs of a life-threatening allergic reaction. THIS IS AN EMERGENCY. Call 911 for medical help. MAKE SURE YOU:   Understand these instructions.   Will watch your condition.   Will get help right away if you are not doing well or get worse.  Document Released: 07/17/2005 Document Revised: 07/06/2011 Document Reviewed: 03/06/2008 [facility] Patient Information 2012 [place], [place].

## 2011-10-11 NOTE — Progress Notes (Signed)
Subjective: Here for recheck on rash.  She saw me yesterday for unrelated issue, but mentioned recurring "blisters" on her skin that she gets on average 3 times a week.  She had been seen by a specialist 1 time in the last year or so for same, had skin and allergy testing, and advised of pine tree allergy.  She has also been seen by the ED several times for the same.  Cause of her rash is still unclear.  She notes that she started having lip swelling and blisters this morning about 9am.    In general she is a smoker, has a bird in her room that she has had since teenage years, she uses [place] soap, takes Ibuprofen on regular basis, gets rash worse in the winter and in the morning.  She denies any other exposures or triggers.   She notes STD testing negative 3 wk ago.   No other aggravating or relieving factors.   Past Medical History  Diagnosis Date  . Abscess and cellulitis   . Asthma   . Tobacco use disorder    ROS Gen: no fever, chills, sweats GI: no NVD, belly pain Lungs: no SOB, wheezing Herat: negative GU: negative  Objective Gen: WD, WN, NAD Skin: several raised wheal lesions along lower abdomen, some on hands and back and left leg Oral cavity - no obvious swelling of tongue or lips Ext: no edema  Assessment: Encounter Diagnosis  Name Primary?  Marland Kitchen Urticaria Yes    Plan: Will try and get prior records [facility].  Advised that trigger/etiology is unclear.  Advised smoking cessation, avoid anything that seems to trigger this, begin Dove Sensitive Skin soap, Zyrtec 10mg  QHS, Zantac 150mg  BID, consider putting bird in different room, and lets see if the rash resolves with these measures.   Keep a symptoms diary.

## 2011-10-25 ENCOUNTER — Other Ambulatory Visit: Payer: Self-pay | Admitting: Medical

## 2011-10-25 ENCOUNTER — Telehealth: Payer: Self-pay | Admitting: Family Medicine

## 2011-10-25 MED ORDER — PREDNISONE 10 MG PO TABS: ORAL_TABLET | ORAL | Status: DC

## 2011-10-25 MED ORDER — TRAMADOL HCL 50 MG PO TABS: 50.0000 mg | ORAL_TABLET | Freq: Three times a day (TID) | ORAL | Status: AC | PRN

## 2011-10-25 MED ORDER — ALPRAZOLAM 0.25 MG PO TABS: 0.2500 mg | ORAL_TABLET | Freq: Two times a day (BID) | ORAL | Status: AC | PRN

## 2011-10-25 NOTE — Telephone Encounter (Signed)
I send prednisone for hives, ultram for pain, and short supply of Xanax to help with the hives and anxiety

## 2011-10-25 NOTE — Telephone Encounter (Signed)
THE CHART IS IN YOUR OFFICE. CLS

## 2011-10-25 NOTE — Telephone Encounter (Signed)
If she has the hives all over, would she like to do a round of prednisone steroid to calm it down?  Regarding her nerves, I saw where she had been on some Prozac and short term Xanax.  Is this what she is talking about?

## 2011-10-25 NOTE — Telephone Encounter (Signed)
pls pull chart for me

## 2011-10-25 NOTE — Telephone Encounter (Signed)
Eileen Tate called she is covered in hives, on the bottom of her feet, stomach, back.  She is blistered and hurting.  Her work sent her home today.  She is taking Zyrtec bid and Benedryl TID to try and keep hives gone, but she is exhausted from the [place].  An states about a year ago Dr. Susann Givens gave her something for her nerves a low dose pill for about 1 month.  She would like to try something like that again.  Please advise pt.    [facility]

## 2011-12-01 ENCOUNTER — Encounter (HOSPITAL_COMMUNITY): Payer: Self-pay

## 2011-12-01 ENCOUNTER — Emergency Department (HOSPITAL_COMMUNITY)
Admission: EM | Admit: 2011-12-01 | Discharge: 2011-12-01 | Disposition: A | Discharge status: Home / Self Care | Payer: Self-pay | Attending: Emergency Medicine | Admitting: Emergency Medicine

## 2011-12-01 DIAGNOSIS — F17200 Nicotine dependence, unspecified, uncomplicated: Secondary | ICD-10-CM | POA: Insufficient documentation

## 2011-12-01 DIAGNOSIS — L509 Urticaria, unspecified: Secondary | ICD-10-CM | POA: Insufficient documentation

## 2011-12-01 LAB — URINALYSIS, ROUTINE W REFLEX MICROSCOPIC
Bilirubin Urine: NEGATIVE
Hgb urine dipstick: NEGATIVE
Specific Gravity, Urine: 1.026 (ref 1.005–1.030)
Urobilinogen, UA: 0.2 mg/dL (ref 0.0–1.0)

## 2011-12-01 LAB — URINE MICROSCOPIC-ADD ON

## 2011-12-01 MED ORDER — FAMOTIDINE 10 MG PO TABS: ORAL_TABLET | ORAL | Status: DC

## 2011-12-01 MED ORDER — EPINEPHRINE 0.3 MG/0.3ML IJ DEVI: 0.3000 mg | Freq: Once | INTRAMUSCULAR | Status: DC

## 2011-12-01 MED ORDER — ALPRAZOLAM 0.25 MG PO TABS: 0.2500 mg | ORAL_TABLET | Freq: Every evening | ORAL | Status: AC | PRN

## 2011-12-01 NOTE — ED Notes (Signed)
Patient reports that she has had a rash on her back, and bilateral legs x 1 year. Patient states she she was given Xanax for rash and did not have a rash while taking Xanax., but is now out of medication. Patient also c/o headache.

## 2011-12-01 NOTE — Discharge Instructions (Signed)
Take xanax as needed for rash.  Continue benadryl.  Take pepcid twice a day for the next three days and then as needed.  Use your epi-pen if you develop swelling lips/tongue or difficulty breathing.  Call [facility] [phone]) if you do not have a primary care doctor and would like assistance with finding one.   You should return to the ER if your symptoms worsen, particularly if you have trouble breathing.  Allergic Reaction, Mild to Moderate Allergies may happen from anything your body is sensitive to. This may be food, medications, pollens, chemicals, and nearly anything around you in everyday life that produces allergens. An allergen is anything that causes an allergy producing substance. Allergens cause your body to release allergic antibodies. Through a chain of events, they cause a release of histamine into the blood stream. Histamines are meant to protect you, but they also cause your discomfort. This is why antihistamines are often used for allergies. Heredity is often a factor in causing allergic reactions. This means you may have some of the same allergies as your parents. Allergies happen in all age groups. You may have some idea of what caused your reaction. There are many allergens around [place]. It may be difficult to know what caused your reaction. If this is a first time event, it may never happen again. Allergies cannot be cured but can be controlled with medications. SYMPTOMS  You may get some or all of the following problems from allergies.  Swelling and itching in and around the mouth.   Tearing, itchy eyes.   Nasal congestion and runny nose.   Sneezing and coughing.   An itchy red rash or hives.   Vomiting or diarrhea.   Difficulty breathing.  Seasonal allergies occur in all age groups. They are seasonal because they usually occur during the same season every year. They may be a reaction to molds, grass pollens, or tree pollens. Other causes of allergies are house dust mite  allergens, pet dander and mold spores. These are just a common few of the thousands of allergens around [place]. All of the symptoms listed above happen when you come in contact with pollens and other allergens. Seasonal allergies are usually not life threatening. They are generally more of a nuisance that can often be handled using medications. Hay fever is a combination of all or some of the above listed allergy problems. It may often be treated with simple over-the-counter medications such as diphenhydramine. Take medication as directed. Check with your caregiver or package insert for child dosages. TREATMENT AND HOME CARE INSTRUCTIONS If hives or rash are present:  Take medications as directed.   You may use an over-the-counter antihistamine (diphenhydramine) for hives and itching as needed. Do not drive or drink alcohol until medications used to treat the reaction have worn off. Antihistamines tend to make people sleepy.   Apply cold cloths (compresses) to the skin or take baths in cool water. This will help itching. Avoid hot baths or showers. Heat will make a rash and itching worse.   If your allergies persist and become more severe, and over the counter medications are not effective, there are many new medications your caretaker can prescribe. Immunotherapy or desensitizing injections can be used if all else fails. Follow up with your caregiver if problems continue.  SEEK MEDICAL CARE IF:   Your allergies are becoming progressively more troublesome.   You suspect a food allergy. Symptoms generally happen within 30 minutes of eating a food.  Your symptoms have not gone away within 2 days or are getting worse.   You develop new symptoms.   You want to retest yourself or your child with a food or drink you think causes an allergic reaction. Never test yourself or your child of a suspected allergy without being under the watchful eye of your caregivers. A second exposure to an allergen may be  life-threatening.  SEEK IMMEDIATE MEDICAL CARE IF:  You develop difficulty breathing or wheezing, or have a tight feeling in your chest or throat.   You develop a swollen mouth, hives, swelling, or itching all over your body.  A severe reaction with any of the above problems should be considered life-threatening. If you suddenly develop difficulty breathing call for local emergency medical help. THIS IS AN EMERGENCY. MAKE SURE YOU:   Understand these instructions.   Will watch your condition.   Will get help right away if you are not doing well or get worse.  Document Released: 05/14/2007 Document Revised: 07/06/2011 Document Reviewed: 05/14/2007 [facility] Patient Information 2012 [place], [place].

## 2011-12-01 NOTE — ED Provider Notes (Signed)
History     CSN: [id number]  Arrival date & time 12/01/11  1314   First MD Initiated Contact with Patient 12/01/11 1447      Chief Complaint  Patient presents with  . Rash  . Migraine    (Consider location/radiation/quality/duration/timing/severity/associated sxs/prior treatment) HPI History provided by pt.   Pt c/o pruritic rash of abdomen, back and all four extremities.  Has had this rash intermittently for one year.  Severely pruritic.  Associated w/ lower lip edema which occurs only at night time.  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  Her PCP prescribed her xanax in 09/2011 because he believed the rash may be secondary to stress.  Pt reports that this medication relieved her sx.  She has also had relief w/ benadryl.  No known contacts.  No associated fever.    Past Medical History  Diagnosis Date  . Abscess and cellulitis   . Asthma   . Tobacco use disorder     History reviewed. No pertinent past surgical history.  No family history on file.  History  Substance Use Topics  . Smoking status: Current Everyday Smoker  . Smokeless tobacco: Never Used  . Alcohol Use: Yes    OB History    Grav Para Term Preterm Abortions TAB SAB Ect Mult Living                  Review of Systems  All other systems reviewed and are negative.    Allergies  Review of patient's allergies indicates no known allergies.  Home Medications   Current Outpatient Rx  Name Route Sig Dispense Refill  . ALPRAZOLAM 0.25 MG PO TABS Oral Take 1 tablet (0.25 mg total) by mouth at bedtime as needed for sleep. 10 tablet 0  . DIPHENHYDRAMINE HCL 25 MG PO CAPS Oral Take 25 mg by mouth every 6 (six) hours as needed. For allergies    . EPINEPHRINE 0.3 MG/0.3ML IJ DEVI Intramuscular Inject 0.3 mLs (0.3 mg total) into the muscle once. 1 Device 0  . FAMOTIDINE 10 MG PO TABS  One po bid x 3 days and then prn 20 tablet 0    BP 107/82  Pulse 82  Temp(Src) 98.5  F (36.9 C) (Oral)  Resp 18  SpO2 99%  LMP 11/13/2011  Physical Exam  Nursing note and vitals reviewed. Constitutional: She is oriented to person, place, and time. She appears well-developed and well-nourished. No distress.  HENT:  Head: Normocephalic and atraumatic.       No lip/tongue edema  Eyes:       Normal appearance  Neck: Normal range of motion.       No stridor  Pulmonary/Chest: Effort normal.  Musculoskeletal: Normal range of motion.  Neurological: She is alert and oriented to person, place, and time.  Skin:       welts w/ surrounding erythema on right upper arm, left posterior shoulder, lower abdomen.  She is scratching profusely.    Psychiatric: She has a normal mood and affect. Her behavior is normal.    ED Course  Procedures (including critical care time)  Labs Reviewed  URINALYSIS, ROUTINE W REFLEX MICROSCOPIC - Abnormal; Notable for the following:    APPearance CLOUDY (*)    Leukocytes, UA MODERATE (*)    All other components within normal limits  URINE MICROSCOPIC-ADD ON - Abnormal; Notable for the following:    Squamous Epithelial / LPF MANY (*)  Bacteria, UA MANY (*)    All other components within normal limits  POCT PREGNANCY, URINE   No results found.   1. Hives       MDM  Pt presents w/ intermittent hives x 29yr.  Has swelling of lower lip, but only at night.  No known contacts.  Has seen an allergist and only allergy is to pine trees.  Her PCP thought her sx may be secondary to stress/anxiety.  He prescribed her 0.25mg  xanax in 09/2011 (verified on DEA database) which she reports, along with benadryl, improves her sx.  She no longer sees this physician because lost her medical insurance.  I recommended that she continue benadryl, take pepcid bid x 3d and then prn, and prescribed ten 0.25mg  xanax for refractory sx and epi-pen as well.  Referred to [facility] and advised to find a PCP asap.  Pt understands that we will not continue to treat her  rash w/ xanax in the ED.          Otilio Miu, [place] 12/01/11 1816

## 2011-12-02 NOTE — ED Provider Notes (Signed)
Medical screening examination/treatment/procedure(s) were performed by non-physician practitioner and as supervising physician I was immediately available for consultation/collaboration. Devoria Albe, MD, Armando Gang   Ward Givens, MD 12/02/11 1124

## 2012-01-21 ENCOUNTER — Encounter (HOSPITAL_COMMUNITY): Payer: Self-pay | Admitting: Emergency Medicine

## 2012-01-21 ENCOUNTER — Emergency Department (HOSPITAL_COMMUNITY)
Admission: EM | Admit: 2012-01-21 | Discharge: 2012-01-22 | Disposition: A | Discharge status: Home / Self Care | Payer: Self-pay | Attending: Emergency Medicine | Admitting: Emergency Medicine

## 2012-01-21 ENCOUNTER — Emergency Department (HOSPITAL_COMMUNITY): Payer: Self-pay

## 2012-01-21 DIAGNOSIS — R079 Chest pain, unspecified: Secondary | ICD-10-CM | POA: Insufficient documentation

## 2012-01-21 DIAGNOSIS — M79609 Pain in unspecified limb: Secondary | ICD-10-CM | POA: Insufficient documentation

## 2012-01-21 DIAGNOSIS — Z79899 Other long term (current) drug therapy: Secondary | ICD-10-CM | POA: Insufficient documentation

## 2012-01-21 DIAGNOSIS — J45909 Unspecified asthma, uncomplicated: Secondary | ICD-10-CM | POA: Insufficient documentation

## 2012-01-21 DIAGNOSIS — F17200 Nicotine dependence, unspecified, uncomplicated: Secondary | ICD-10-CM | POA: Insufficient documentation

## 2012-01-21 LAB — URINE MICROSCOPIC-ADD ON

## 2012-01-21 LAB — POCT I-STAT, CHEM 8
Calcium, Ion: 1.2 mmol/L (ref 1.12–1.32)
Glucose, Bld: 78 mg/dL (ref 70–99)
HCT: 46 % (ref 36.0–46.0)
Hemoglobin: 15.6 g/dL — ABNORMAL HIGH (ref 12.0–15.0)
Potassium: 3.7 mEq/L (ref 3.5–5.1)

## 2012-01-21 LAB — URINALYSIS, ROUTINE W REFLEX MICROSCOPIC
Bilirubin Urine: NEGATIVE
Glucose, UA: NEGATIVE mg/dL
Hgb urine dipstick: NEGATIVE
Protein, ur: NEGATIVE mg/dL

## 2012-01-21 LAB — POCT PREGNANCY, URINE: Preg Test, Ur: NEGATIVE

## 2012-01-21 MED ORDER — TRAMADOL HCL 50 MG PO TABS
50.0000 mg | ORAL_TABLET | Freq: Once | ORAL | Status: AC
  Administered 2012-01-21: 50 mg via ORAL
  Filled 2012-01-21: qty 1

## 2012-01-21 MED ORDER — OXYCODONE-ACETAMINOPHEN 5-325 MG PO TABS
1.0000 | ORAL_TABLET | Freq: Once | ORAL | Status: AC
  Administered 2012-01-21: 1 via ORAL
  Filled 2012-01-21: qty 1

## 2012-01-21 MED ORDER — TRAMADOL HCL 50 MG PO TABS: 50.0000 mg | ORAL_TABLET | Freq: Four times a day (QID) | ORAL | Status: AC | PRN

## 2012-01-21 NOTE — ED Notes (Signed)
Pt to xray

## 2012-01-21 NOTE — ED Provider Notes (Addendum)
History     CSN: [id number]  Arrival date & time 01/21/12  4098   First MD Initiated Contact with Patient 01/21/12 2159      Chief Complaint  Patient presents with  . Chest Pain    (Consider location/radiation/quality/duration/timing/severity/associated sxs/prior treatment) Patient is a 26 y.o. female presenting with chest pain. The history is provided by the patient and medical records.  Chest Pain Pertinent negatives for primary symptoms include no fever, no shortness of breath, no cough, no palpitations, no abdominal pain, no nausea and no vomiting.    the patient is a 27 year old, female, with no significant past medical history.  He presents emergency department complaining of intermittent, sharp, right-sided chest pain, along with right arm pain.  Her symptoms began around 5:30 this morning and have been persistent.  When the chest.  Pain occurs it only last for a few minutes.  However, the arm pain.  Has not resolved.  She denies cough, or shortness of breath.  She denies nausea, vomiting, fevers, chills, leg pain or swelling.  She denies recent travel or surgery.  Presently, she does not have chest pain  Past Medical History  Diagnosis Date  . Abscess and cellulitis   . Asthma   . Tobacco use disorder     History reviewed. No pertinent past surgical history.  No family history on file.  History  Substance Use Topics  . Smoking status: Current Everyday Smoker  . Smokeless tobacco: Never Used  . Alcohol Use: Yes    OB History    Grav Para Term Preterm Abortions TAB SAB Ect Mult Living                  Review of Systems  Constitutional: Negative for fever and chills.  HENT: Negative for congestion.   Respiratory: Negative for cough, chest tightness and shortness of breath.   Cardiovascular: Positive for chest pain. Negative for palpitations and leg swelling.  Gastrointestinal: Negative for nausea, vomiting and abdominal pain.  Genitourinary: Negative for  dysuria.  Musculoskeletal: Negative for back pain.  Neurological: Negative for headaches.  Hematological: Does not bruise/bleed easily.  Psychiatric/Behavioral: Negative for confusion.  All other systems reviewed and are negative.    Allergies  Ibuprofen  Home Medications   Current Outpatient Rx  Name Route Sig Dispense Refill  . DIPHENHYDRAMINE HCL 25 MG PO CAPS Oral Take 25 mg by mouth every 6 (six) hours as needed. For allergies    . EPINEPHRINE 0.3 MG/0.3ML IJ DEVI Intramuscular Inject 0.3 mLs (0.3 mg total) into the muscle once. 1 Device 0    BP 120/80  Pulse 70  Temp 98.4 F (36.9 C) (Oral)  Resp 19  Ht 5' (1.524 m)  Wt 160 lb (72.576 kg)  BMI 31.25 kg/m2  SpO2 99%  LMP 12/02/2011  Physical Exam  Nursing note and vitals reviewed. Constitutional: She is oriented to person, place, and time. She appears well-developed and well-nourished. No distress.  HENT:  Head: Normocephalic and atraumatic.  Eyes: Conjunctivae are normal. Pupils are equal, round, and reactive to light.  Neck: Normal range of motion. Neck supple.  Cardiovascular: Normal rate.   No murmur heard. Pulmonary/Chest: Effort normal and breath sounds normal. No respiratory distress.  Abdominal: Soft. Bowel sounds are normal. There is no tenderness.  Musculoskeletal: Normal range of motion. She exhibits no edema.  Neurological: She is alert and oriented to person, place, and time.  Skin: Skin is warm and dry.  Psychiatric: She has a normal  mood and affect. Judgment and thought content normal.    ED Course  Procedures (including critical care time)  Labs Reviewed  URINALYSIS, ROUTINE W REFLEX MICROSCOPIC - Abnormal; Notable for the following:    APPearance CLOUDY (*)     Leukocytes, UA LARGE (*)     All other components within normal limits  URINE MICROSCOPIC-ADD ON - Abnormal; Notable for the following:    Squamous Epithelial / LPF MANY (*)     Bacteria, UA FEW (*)     All other components  within normal limits  POCT I-STAT, CHEM 8 - Abnormal; Notable for the following:    Hemoglobin 15.6 (*)     All other components within normal limits  POCT PREGNANCY, URINE   No results found.   No diagnosis found.   Date: 01/21/2012  Rate: 87  Rhythm: normal sinus rhythm  QRS Axis: normal  Intervals: normal  ST/T Wave abnormalities: normal  Conduction Disutrbances: none  Narrative Interpretation: unremarkable     MDM  Chest pain Intermittent sharp No pneumonia, no ptx. Doubt pe. No resp distress.  Of course, not acs.         Cheri Guppy, MD 01/21/12 4098  Cheri Guppy, MD 01/21/12 2326

## 2012-01-21 NOTE — Discharge Instructions (Signed)
Your chest x-ray is normal.  Your EKG does not show any signs of an irregular heartbeat or heart attack.  Use Tylenol for pain.  Use tramadol for more severe pain.  Followup with your Dr. if your symptoms.  Last more than 3-4 days.  Return for worse or uncontrolled symptoms

## 2012-01-21 NOTE — ED Notes (Signed)
Pt c/o mid upper chest pain onset this am with pain in right arm radiating into shoulder.  No nausea or vomiting.  Has hx of anxiety attacks

## 2012-01-22 NOTE — ED Notes (Signed)
Patient is AOx4 and comfortable with her discharge instructions.  Patient's ride is present. 

## 2012-01-25 ENCOUNTER — Telehealth: Payer: Self-pay | Admitting: Family Medicine

## 2012-01-25 MED ORDER — ALPRAZOLAM 0.25 MG PO TABS: 0.2500 mg | ORAL_TABLET | Freq: Two times a day (BID) | ORAL | Status: AC | PRN

## 2012-01-25 NOTE — Telephone Encounter (Signed)
Called xanax in per jcl 

## 2012-01-25 NOTE — Telephone Encounter (Signed)
Give her Xanax 0.25 mg #20. 1 twice a day as needed for anxiety. No refill

## 2012-01-25 NOTE — Telephone Encounter (Signed)
Will you call this in.

## 2012-03-26 ENCOUNTER — Encounter (HOSPITAL_COMMUNITY): Payer: Self-pay | Admitting: Emergency Medicine

## 2012-03-26 ENCOUNTER — Emergency Department (HOSPITAL_COMMUNITY)
Admission: EM | Admit: 2012-03-26 | Discharge: 2012-03-26 | Disposition: A | Discharge status: Home / Self Care | Payer: Self-pay | Attending: Emergency Medicine | Admitting: Emergency Medicine

## 2012-03-26 ENCOUNTER — Emergency Department (HOSPITAL_COMMUNITY): Payer: Self-pay

## 2012-03-26 DIAGNOSIS — F17200 Nicotine dependence, unspecified, uncomplicated: Secondary | ICD-10-CM | POA: Insufficient documentation

## 2012-03-26 DIAGNOSIS — R1012 Left upper quadrant pain: Secondary | ICD-10-CM | POA: Insufficient documentation

## 2012-03-26 DIAGNOSIS — J45909 Unspecified asthma, uncomplicated: Secondary | ICD-10-CM | POA: Insufficient documentation

## 2012-03-26 DIAGNOSIS — N390 Urinary tract infection, site not specified: Secondary | ICD-10-CM | POA: Insufficient documentation

## 2012-03-26 DIAGNOSIS — R109 Unspecified abdominal pain: Secondary | ICD-10-CM

## 2012-03-26 LAB — URINALYSIS, ROUTINE W REFLEX MICROSCOPIC
Bilirubin Urine: NEGATIVE
Ketones, ur: NEGATIVE mg/dL
Nitrite: NEGATIVE
Specific Gravity, Urine: 1.007 (ref 1.005–1.030)
Urobilinogen, UA: 0.2 mg/dL (ref 0.0–1.0)

## 2012-03-26 LAB — CBC WITH DIFFERENTIAL/PLATELET
Basophils Relative: 1 % (ref 0–1)
Eosinophils Absolute: 0.3 10*3/uL (ref 0.0–0.7)
MCH: 33.6 pg (ref 26.0–34.0)
MCHC: 35.2 g/dL (ref 30.0–36.0)
Neutrophils Relative %: 55 % (ref 43–77)
Platelets: 305 10*3/uL (ref 150–400)
RDW: 13.3 % (ref 11.5–15.5)

## 2012-03-26 LAB — POCT I-STAT, CHEM 8
Calcium, Ion: 1.26 mmol/L — ABNORMAL HIGH (ref 1.12–1.23)
Chloride: 103 mEq/L (ref 96–112)
Creatinine, Ser: 0.8 mg/dL (ref 0.50–1.10)
Glucose, Bld: 87 mg/dL (ref 70–99)
HCT: 49 % — ABNORMAL HIGH (ref 36.0–46.0)

## 2012-03-26 LAB — URINE MICROSCOPIC-ADD ON

## 2012-03-26 LAB — POCT PREGNANCY, URINE: Preg Test, Ur: NEGATIVE

## 2012-03-26 MED ORDER — HYDROMORPHONE HCL PF 1 MG/ML IJ SOLN
1.0000 mg | Freq: Once | INTRAMUSCULAR | Status: AC
  Administered 2012-03-26: 1 mg via INTRAVENOUS
  Filled 2012-03-26: qty 1

## 2012-03-26 MED ORDER — TRAMADOL HCL 50 MG PO TABS: 50.0000 mg | ORAL_TABLET | Freq: Four times a day (QID) | ORAL | Status: AC | PRN

## 2012-03-26 MED ORDER — SULFAMETHOXAZOLE-TRIMETHOPRIM 800-160 MG PO TABS: 1.0000 | ORAL_TABLET | Freq: Two times a day (BID) | ORAL | Status: AC

## 2012-03-26 MED ORDER — SULFAMETHOXAZOLE-TMP DS 800-160 MG PO TABS
1.0000 | ORAL_TABLET | Freq: Once | ORAL | Status: AC
  Administered 2012-03-26: 1 via ORAL
  Filled 2012-03-26: qty 1

## 2012-03-26 NOTE — ED Notes (Addendum)
Pt having pain and pressure in lower abd, and having L leg pain; denies dysuria,n/v/d, abnormal discharge; pt has distended abd; LBM 8/26; pt does report feeling dizziness

## 2012-03-26 NOTE — ED Notes (Signed)
Pt for discharge.Vital signs stable and GCS 15 

## 2012-03-26 NOTE — ED Provider Notes (Signed)
History     CSN: [id number]  Arrival date & time 03/26/12  0022   First MD Initiated Contact with Patient 03/26/12 0125      Chief Complaint  Patient presents with  . Abdominal Pain    (Consider location/radiation/quality/duration/timing/severity/associated sxs/prior treatment) HPI Comments: Pt has had ~ 18 hours of intermittent sharp L sided flank and LUQ and mild L sided abd pain.  No n/v/f/c/cough/sob and no hematuria, d/c, bleeding.  She is supposed to start menses any day.  Sx are 7/10, nothing makes better orworse, no change in bowel habits, no urinary sx, no rashes.  No hx of same.  Patient is a 26 y.o. female presenting with abdominal pain. The history is provided by the patient and the spouse.  Abdominal Pain The primary symptoms of the illness include abdominal pain.    Past Medical History  Diagnosis Date  . Abscess and cellulitis   . Asthma   . Tobacco use disorder     History reviewed. No pertinent past surgical history.  History reviewed. No pertinent family history.  History  Substance Use Topics  . Smoking status: Current Everyday Smoker -- 0.5 packs/day    Types: Cigarettes  . Smokeless tobacco: Never Used  . Alcohol Use: Yes     occasion    OB History    Grav Para Term Preterm Abortions TAB SAB Ect Mult Living                  Review of Systems  Gastrointestinal: Positive for abdominal pain.  All other systems reviewed and are negative.    Allergies  Ibuprofen  Home Medications   Current Outpatient Rx  Name Route Sig Dispense Refill  . ALPRAZOLAM 0.25 MG PO TABS Oral Take 0.25 mg by mouth at bedtime as needed. For anxiety    . DIPHENHYDRAMINE HCL 25 MG PO CAPS Oral Take 25 mg by mouth every 6 (six) hours as needed. For allergies and swelling    . SULFAMETHOXAZOLE-TRIMETHOPRIM 800-160 MG PO TABS Oral Take 1 tablet by mouth every 12 (twelve) hours. 10 tablet 0  . TRAMADOL HCL 50 MG PO TABS Oral Take 1 tablet (50 mg total) by mouth every  6 (six) hours as needed for pain. 15 tablet 0    BP 119/72  Pulse 95  Temp 97.8 F (36.6 C) (Oral)  Resp 18  SpO2 100%  LMP 01/29/2012  Physical Exam  Nursing note and vitals reviewed. Constitutional: She appears well-developed and well-nourished. No distress.  HENT:  Head: Normocephalic and atraumatic.  Mouth/Throat: Oropharynx is clear and moist. No oropharyngeal exudate.  Eyes: Conjunctivae and EOM are normal. Pupils are equal, round, and reactive to light. Right eye exhibits no discharge. Left eye exhibits no discharge. No scleral icterus.  Neck: Normal range of motion. Neck supple. No JVD present. No thyromegaly present.  Cardiovascular: Normal rate, regular rhythm, normal heart sounds and intact distal pulses.  Exam reveals no gallop and no friction rub.   No murmur heard. Pulmonary/Chest: Effort normal and breath sounds normal. No respiratory distress. She has no wheezes. She has no rales.  Abdominal: Soft. Bowel sounds are normal. She exhibits no distension and no mass. There is tenderness ( mild LLQ ttp.).       L sided flank ttp with CVA percussion.  Musculoskeletal: Normal range of motion. She exhibits no edema and no tenderness.  Lymphadenopathy:    She has no cervical adenopathy.  Neurological: She is alert. Coordination normal.  Skin:  Skin is warm and dry. No rash noted. No erythema.  Psychiatric: She has a normal mood and affect. Her behavior is normal.    ED Course  Procedures (including critical care time)  Labs Reviewed  URINALYSIS, ROUTINE W REFLEX MICROSCOPIC - Abnormal; Notable for the following:    APPearance CLOUDY (*)     Leukocytes, UA MODERATE (*)     All other components within normal limits  CBC WITH DIFFERENTIAL - Abnormal; Notable for the following:    Hemoglobin 16.0 (*)     All other components within normal limits  URINE MICROSCOPIC-ADD ON - Abnormal; Notable for the following:    Squamous Epithelial / LPF FEW (*)     Bacteria, UA FEW (*)      All other components within normal limits  POCT PREGNANCY, URINE   Ct Abdomen Pelvis Wo Contrast  03/26/2012  *RADIOLOGY REPORT*  Clinical Data: Abdominal pain  CT ABDOMEN AND PELVIS WITHOUT CONTRAST  Technique:  Multidetector CT imaging of the abdomen and pelvis was performed following the standard protocol without intravenous contrast.  Comparison: None.  Findings: Limited images through the lung bases demonstrate no significant appreciable abnormality. The heart size is within normal limits. No pleural or pericardial effusion.  Organ abnormality/lesion detection is limited in the absence of intravenous contrast. Within this limitation, unremarkable liver, biliary system, spleen, pancreas, adrenal glands.  Mild increased attenuation of the medullary pyramids.  No hydronephrosis or hydroureter.  No urinary tract calculi identified.  No bowel obstruction.  No CT evidence for colitis.  Normal appendix.  No free intraperitoneal air or fluid.  No lymphadenopathy.  Small fat containing umbilical hernia.  Partially decompressed bladder with nonspecific mild wall thickening.  Unremarkable CT appearance to the uterus and adnexa. No inguinal lymphadenopathy.  No acute osseous finding.  IMPRESSION: Mild bladder wall thickening is nonspecific given incomplete distension.  Correlate with urinalysis.  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  No hydronephrosis or ureteral calculi.  Otherwise, no acute intra-abdominal process identified by unenhanced CT.   Original Report Authenticated By: Waneta Martins, M.D.      1. UTI (lower urinary tract infection)   2. Abdominal pain       MDM  Non peritoneal abd, NO right sided ttp, no guarding.  R/o KS, not pregnant, normal WBC  CT pending,NSAID allergy, dilaudid ordered.  CT scan reviewed - possible cystitis - no pyelo and no KS, UA c/w early UTI, non pregnant, pt feels better after meds, normal VS, stable  for d/c.  Bactrim given prior to d/c.  Discharge Prescriptions include:  Bactrim Ultram      Vida Roller, MD 03/26/12 [phone]

## 2012-03-26 NOTE — ED Notes (Signed)
Pt presented to ED with lower abdominal pain radiating to her back from yesterday morning.

## 2012-04-09 ENCOUNTER — Encounter (HOSPITAL_COMMUNITY): Payer: Self-pay | Admitting: *Deleted

## 2012-04-09 ENCOUNTER — Emergency Department (HOSPITAL_COMMUNITY): Payer: Self-pay

## 2012-04-09 ENCOUNTER — Emergency Department (HOSPITAL_COMMUNITY)
Admission: EM | Admit: 2012-04-09 | Discharge: 2012-04-09 | Disposition: A | Discharge status: Home / Self Care | Payer: Self-pay | Attending: Emergency Medicine | Admitting: Emergency Medicine

## 2012-04-09 DIAGNOSIS — M549 Dorsalgia, unspecified: Secondary | ICD-10-CM | POA: Insufficient documentation

## 2012-04-09 DIAGNOSIS — F17200 Nicotine dependence, unspecified, uncomplicated: Secondary | ICD-10-CM | POA: Insufficient documentation

## 2012-04-09 DIAGNOSIS — Z888 Allergy status to other drugs, medicaments and biological substances status: Secondary | ICD-10-CM | POA: Insufficient documentation

## 2012-04-09 DIAGNOSIS — J45909 Unspecified asthma, uncomplicated: Secondary | ICD-10-CM | POA: Insufficient documentation

## 2012-04-09 HISTORY — DX: Urinary tract infection, site not specified: N39.0

## 2012-04-09 LAB — POCT I-STAT, CHEM 8
Calcium, Ion: 1.31 mmol/L — ABNORMAL HIGH (ref 1.12–1.23)
Creatinine, Ser: 1 mg/dL (ref 0.50–1.10)
Glucose, Bld: 74 mg/dL (ref 70–99)
HCT: 43 % (ref 36.0–46.0)
Hemoglobin: 14.6 g/dL (ref 12.0–15.0)

## 2012-04-09 LAB — URINALYSIS, ROUTINE W REFLEX MICROSCOPIC
Bilirubin Urine: NEGATIVE
Hgb urine dipstick: NEGATIVE
Ketones, ur: NEGATIVE mg/dL
Nitrite: NEGATIVE
Protein, ur: NEGATIVE mg/dL
Specific Gravity, Urine: 1.006 (ref 1.005–1.030)
Urobilinogen, UA: 0.2 mg/dL (ref 0.0–1.0)

## 2012-04-09 LAB — CBC WITH DIFFERENTIAL/PLATELET
Basophils Absolute: 0 10*3/uL (ref 0.0–0.1)
Basophils Relative: 0 % (ref 0–1)
Eosinophils Absolute: 0.2 10*3/uL (ref 0.0–0.7)
Eosinophils Relative: 3 % (ref 0–5)
HCT: 41.3 % (ref 36.0–46.0)
MCH: 33.3 pg (ref 26.0–34.0)
MCHC: 35.1 g/dL (ref 30.0–36.0)
Monocytes Absolute: 0.5 10*3/uL (ref 0.1–1.0)
Neutro Abs: 4.8 10*3/uL (ref 1.7–7.7)
RDW: 12.7 % (ref 11.5–15.5)

## 2012-04-09 MED ORDER — TRAMADOL HCL 50 MG PO TABS
50.0000 mg | ORAL_TABLET | Freq: Once | ORAL | Status: AC
  Administered 2012-04-09: 50 mg via ORAL
  Filled 2012-04-09: qty 1

## 2012-04-09 MED ORDER — TRAMADOL HCL 50 MG PO TABS: 50.0000 mg | ORAL_TABLET | Freq: Three times a day (TID) | ORAL | Status: AC | PRN

## 2012-04-09 NOTE — ED Provider Notes (Signed)
History     CSN: [id number]  Arrival date & time 04/09/12  4098   First MD Initiated Contact with Patient 04/09/12 [phone]      Chief Complaint  Patient presents with  . 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  States taking Advil without relief   Patient is a 26 y.o. female presenting with back pain. The history is provided by the patient.  Back Pain  This is a new problem. The current episode started more than 1 week ago. The problem occurs constantly. The problem has not changed since onset.The pain is associated with no known injury. Pertinent negatives include no chest pain, no fever, no numbness, no dysuria and no weakness.    Past Medical History  Diagnosis Date  . Abscess and cellulitis   . Asthma   . Tobacco use disorder   . UTI (lower urinary tract infection)     History reviewed. No pertinent past surgical history.  No family history on file.  History  Substance Use Topics  . Smoking status: Current Everyday Smoker -- 0.5 packs/day    Types: Cigarettes  . Smokeless tobacco: Never Used  . Alcohol Use: Yes     occasion    OB History    Grav Para Term Preterm Abortions TAB SAB Ect Mult Living                  Review of Systems  Constitutional: Negative for fever and chills.  HENT: Negative for congestion.   Respiratory: Positive for cough. Negative for shortness of breath.   Cardiovascular: Negative for chest pain and palpitations.  Genitourinary: Negative for dysuria and frequency.  Musculoskeletal: Positive for back pain.  Neurological: Negative for dizziness, weakness and numbness.    Allergies  Ibuprofen  Home Medications   Current Outpatient Rx  Name Route Sig Dispense Refill  . ALPRAZOLAM 0.25 MG  PO TABS Oral Take 0.25 mg by mouth at bedtime as needed. For anxiety    . DIPHENHYDRAMINE HCL 25 MG PO CAPS Oral Take 25 mg by mouth every 6 (six) hours as needed. For allergies and swelling    . TRAMADOL HCL 50 MG PO TABS Oral Take 1 tablet (50 mg total) by mouth every 8 (eight) hours as needed for pain. 30 tablet 0    BP 112/74  Pulse 69  Temp 98.1 F (36.7 C) (Oral)  Resp 18  SpO2 100%  LMP 01/29/2012  Physical Exam  Constitutional: She is oriented to person, place, and time. She appears well-developed and well-nourished.  HENT:  Head: Normocephalic.  Eyes: Pupils are equal, round, and reactive to light.  Neck: Normal range of motion.  Cardiovascular: Normal rate.   Abdominal: Soft. There is no tenderness.  Musculoskeletal: Normal range of motion.       Arms: Neurological: She is alert and oriented to person, place, and time.  Skin: Skin is warm.    ED Course  Procedures (including critical care time)  Labs Reviewed  URINALYSIS, ROUTINE W REFLEX MICROSCOPIC - Abnormal; Notable for the following:    Color, Urine STRAW (*)     All other components within normal limits  POCT I-STAT, CHEM 8 - Abnormal; Notable for the following:    Calcium, Ion 1.31 (*)     All other  components within normal limits  CBC WITH DIFFERENTIAL  PREGNANCY, URINE   Dg Chest 2 View  04/09/2012  *RADIOLOGY REPORT*  Clinical Data: Back pain and pain on inspiration for 2 weeks.  CHEST - 2 VIEW  Comparison: 01/21/2012  Findings: The heart size and pulmonary vascularity are normal. The lungs appear clear and expanded without focal air space disease or consolidation. No blunting of the costophrenic angles.  No pneumothorax.  Mediastinal contours are intact.  No significant change since previous study.  IMPRESSION: No evidence of active pulmonary disease.   Original Report Authenticated By: Marlon Pel, M.D.      1. 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

## 2012-04-09 NOTE — ED Provider Notes (Signed)
Medical screening examination/treatment/procedure(s) were performed by non-physician practitioner and as supervising physician I was immediately available for consultation/collaboration.  Sunnie Nielsen, MD 04/09/12 [phone]

## 2012-04-09 NOTE — ED Notes (Signed)
C/o bilateral upper back and axillary pain, on & off for ~ 1 week, worse with deep breath, gradually progressively worse. Also reports fever. Ibuprofen 800mg  taken 4 hrs ago. Also reports chills. tx'd for UTI 1 week ago. Denies uti sx at his time. Finished abx 2d ago.

## 2012-04-09 NOTE — ED Notes (Signed)
Pt states she was seen in ED one week ago for the same Bilateral flank and low back pain she continues to have.  Pain is intermittent, associated with chills, worsened with  Inspiration, rated 8/10 despite ibuprofen.  Denies diarrhea, urinary symptoms though she was treated for UTI last week.

## 2012-04-15 ENCOUNTER — Emergency Department (HOSPITAL_COMMUNITY): Payer: Self-pay

## 2012-04-15 ENCOUNTER — Encounter (HOSPITAL_COMMUNITY): Payer: Self-pay | Admitting: Emergency Medicine

## 2012-04-15 ENCOUNTER — Emergency Department (HOSPITAL_COMMUNITY)
Admission: EM | Admit: 2012-04-15 | Discharge: 2012-04-16 | Disposition: A | Discharge status: Home / Self Care | Payer: Self-pay | Attending: Emergency Medicine | Admitting: Emergency Medicine

## 2012-04-15 DIAGNOSIS — M545 Low back pain, unspecified: Secondary | ICD-10-CM | POA: Insufficient documentation

## 2012-04-15 DIAGNOSIS — J45909 Unspecified asthma, uncomplicated: Secondary | ICD-10-CM | POA: Insufficient documentation

## 2012-04-15 DIAGNOSIS — R55 Syncope and collapse: Secondary | ICD-10-CM | POA: Insufficient documentation

## 2012-04-15 DIAGNOSIS — F17200 Nicotine dependence, unspecified, uncomplicated: Secondary | ICD-10-CM | POA: Insufficient documentation

## 2012-04-15 LAB — CBC WITH DIFFERENTIAL/PLATELET
Basophils Relative: 0 % (ref 0–1)
Eosinophils Absolute: 0.3 10*3/uL (ref 0.0–0.7)
Eosinophils Relative: 3 % (ref 0–5)
Hemoglobin: 15.4 g/dL — ABNORMAL HIGH (ref 12.0–15.0)
Lymphs Abs: 2.1 10*3/uL (ref 0.7–4.0)
MCH: 33.2 pg (ref 26.0–34.0)
MCHC: 35.5 g/dL (ref 30.0–36.0)
MCV: 93.5 fL (ref 78.0–100.0)
Monocytes Relative: 6 % (ref 3–12)
Neutrophils Relative %: 62 % (ref 43–77)
RBC: 4.64 MIL/uL (ref 3.87–5.11)

## 2012-04-15 LAB — URINALYSIS, ROUTINE W REFLEX MICROSCOPIC
Bilirubin Urine: NEGATIVE
Glucose, UA: NEGATIVE mg/dL
Hgb urine dipstick: NEGATIVE
Protein, ur: NEGATIVE mg/dL
Urobilinogen, UA: 0.2 mg/dL (ref 0.0–1.0)

## 2012-04-15 LAB — BASIC METABOLIC PANEL
BUN: 8 mg/dL (ref 6–23)
CO2: 26 mEq/L (ref 19–32)
Calcium: 9.7 mg/dL (ref 8.4–10.5)
GFR calc non Af Amer: >90 mL/min (ref >90)
Glucose, Bld: 99 mg/dL (ref 70–99)
Potassium: 3.4 mEq/L — ABNORMAL LOW (ref 3.5–5.1)
Sodium: 136 mEq/L (ref 135–145)

## 2012-04-15 MED ORDER — HYDROCODONE-ACETAMINOPHEN 5-325 MG PO TABS
1.0000 | ORAL_TABLET | Freq: Once | ORAL | Status: AC
  Administered 2012-04-15: 1 via ORAL
  Filled 2012-04-15: qty 1

## 2012-04-15 MED ORDER — DIPHENHYDRAMINE HCL 25 MG PO CAPS
25.0000 mg | ORAL_CAPSULE | Freq: Once | ORAL | Status: AC
  Administered 2012-04-15: 25 mg via ORAL
  Filled 2012-04-15: qty 1

## 2012-04-15 NOTE — ED Notes (Signed)
Pt c/o bilateral leg pain with dizziness starting 1 hour ago; pt denies any obvious injury; pt sts hx of panic attack and sts feels same

## 2012-04-15 NOTE — ED Provider Notes (Signed)
History     CSN: [id number]  Arrival date & time 04/15/12  1359   First MD Initiated Contact with Patient 04/15/12 2006      Chief Complaint  Patient presents with  . Leg Pain  . Dizziness    (Consider location/radiation/quality/duration/timing/severity/associated sxs/prior treatment) HPI Comments: Patient with a history of low back pain.  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  Prescribed tramadol, which he, states is not helping.  Today.  She reports a syncopal episode while at work.  She states she suddenly had numbness in her anterior thighs with increased pain, developed a headache, and had a syncopal episode associated with dizziness.  She is unsure how long she was unconscious.  Her employer drove her to the emergency department  Patient is a 26 y.o. female presenting with leg pain. The history is provided by the patient.  Leg Pain  The incident occurred 6 to 12 hours ago. The incident occurred at work. There was no injury mechanism. The quality of the pain is described as aching. The pain is at a severity of 5/10. The pain is moderate. The pain has been constant since onset. Associated symptoms include numbness.    Past Medical History  Diagnosis Date  . Abscess and cellulitis   . Asthma   . Tobacco use disorder   . UTI (lower urinary tract infection)     History reviewed. No pertinent past surgical history.  History reviewed. No pertinent family history.  History  Substance Use Topics  . Smoking status: Current Every Day Smoker -- 0.5 packs/day    Types: Cigarettes  . Smokeless tobacco: Never Used  . Alcohol Use: Yes     occasion    OB History    Grav Para Term Preterm Abortions TAB SAB Ect Mult Living                  Review of Systems  Constitutional: Positive for fever.  HENT: Negative for congestion, rhinorrhea, neck pain and neck stiffness.   Eyes: Negative for visual disturbance.  Cardiovascular: Negative  for chest pain.  Gastrointestinal: Negative for nausea, abdominal pain, diarrhea, constipation and rectal pain.  Musculoskeletal: Positive for back pain. Negative for myalgias.  Skin: Negative for rash and wound.  Neurological: Positive for dizziness, syncope, numbness and headaches. Negative for speech difficulty, weakness and light-headedness.    Allergies  Ibuprofen  Home Medications   Current Outpatient Rx  Name Route Sig Dispense Refill  . TRAMADOL HCL 50 MG PO TABS Oral Take 1 tablet (50 mg total) by mouth every 8 (eight) hours as needed for pain. 30 tablet 0  . DIAZEPAM 5 MG PO TABS Oral Take 1 tablet (5 mg total) by mouth every 8 (eight) hours as needed for anxiety. 10 tablet 0  . HYDROCODONE-ACETAMINOPHEN 5-325 MG PO TABS Oral Take 1 tablet by mouth every 6 (six) hours as needed for pain. 15 tablet 0  . PREDNISONE 20 MG PO TABS  3 Tabs PO Days 1-3, then 2 tabs PO Days 4-6, then 1 tab PO Day 7-9, then Half Tab PO Day 10-12 20 tablet 0    BP 123/76  Pulse 79  Temp 99.1 F (37.3 C) (Oral)  Resp 25  SpO2 100%  LMP 01/29/2012  Physical Exam  Constitutional: She is oriented to person, place, and time. She appears well-developed.  HENT:  Head: Normocephalic.  Eyes: Pupils are equal, round, and reactive to  light.  Neck: Normal range of motion.  Cardiovascular: Normal rate.   Abdominal: Soft.  Genitourinary: Rectum normal. Rectal exam shows no internal hemorrhoid, no tenderness and anal tone normal.  Musculoskeletal: Normal range of motion. She exhibits no edema and no tenderness.  Neurological: She is alert and oriented to person, place, and time. She displays normal reflexes. She exhibits normal muscle tone. Coordination normal.  Skin: Skin is warm. No erythema.    ED Course  Procedures (including critical care time)  Labs Reviewed  BASIC METABOLIC PANEL - Abnormal; Notable for the following:    Potassium 3.4 (*)     All other components within normal limits  CBC  WITH DIFFERENTIAL - Abnormal; Notable for the following:    Hemoglobin 15.4 (*)     All other components within normal limits  URINALYSIS, ROUTINE W REFLEX MICROSCOPIC  POCT PREGNANCY, URINE   Dg Lumbar Spine Complete  04/15/2012  *RADIOLOGY REPORT*  Clinical Data: Back pain  LUMBAR SPINE - COMPLETE 4+ VIEW  Comparison:  CT abdomen pelvis 03/26/2012.  Findings:  There is no evidence of lumbar spine fracture. Alignment is normal.  Intervertebral disc spaces are maintained.  IMPRESSION: Negative.   Original Report Authenticated By: Elsie Stain, M.D.    Ct Head Wo Contrast  04/16/2012  *RADIOLOGY REPORT*  Clinical Data:  Headaches with dizziness.  CT HEAD WITHOUT CONTRAST  Technique:  Contiguous axial images were obtained from the base of the skull through the vertex without contrast  Comparison:  None.  Findings:  The brain has a normal appearance without evidence for hemorrhage, acute infarction, hydrocephalus, or mass lesion.  There is no extra axial fluid collection.  The skull and paranasal sinuses are normal.  IMPRESSION: Normal CT of the head without contrast.   Original Report Authenticated By: Elsie Stain, M.D.      1. Syncope   2. Lumbosacral pain     ED ECG REPORT   Date: 04/16/2012  EKG Time: 12:42 AM  Rate:69  Rhythm: normal sinus rhythm,  there are no previous tracings available for comparison  Axis: normal  Intervals:none  ST&T Change: normal  Narrative Interpretation: normal            MDM  Head CT, EKG, lab work, and a lumbosacral x-ray, all, normal.  We'll treat this patient for lumbosacral strain with 12 date steroid taper, and to spasmatic, and pain control.  She is also been referred to orthopedics for followup if her pain is not resolving         Arman Filter, NP 04/16/12 0042  Arman Filter, NP 04/16/12 0042  Arman Filter, NP 04/16/12 [phone]

## 2012-04-16 MED ORDER — DIAZEPAM 5 MG PO TABS: 5.0000 mg | ORAL_TABLET | Freq: Three times a day (TID) | ORAL | Status: DC | PRN

## 2012-04-16 MED ORDER — HYDROCODONE-ACETAMINOPHEN 5-325 MG PO TABS: 1.0000 | ORAL_TABLET | Freq: Four times a day (QID) | ORAL | Status: DC | PRN

## 2012-04-16 MED ORDER — PREDNISONE 20 MG PO TABS: ORAL_TABLET | ORAL | Status: DC

## 2012-04-16 NOTE — ED Provider Notes (Signed)
Medical screening examination/treatment/procedure(s) were performed by non-physician practitioner and as supervising physician I was immediately available for consultation/collaboration.   Celene Kras, MD 04/16/12 [phone]

## 2012-04-16 NOTE — Discharge Instructions (Signed)
Back Pain, Adult Low back pain is very common. About 1 in 5 people have back pain.The cause of low back pain is rarely dangerous. The pain often gets better over time.About half of people with a sudden onset of back pain feel better in just 2 weeks. About 8 in 10 people feel better by 6 weeks.  CAUSES Some common causes of back pain include:  Strain of the muscles or ligaments supporting the spine.   Wear and tear (degeneration) of the spinal discs.   Arthritis.   Direct injury to the back.  DIAGNOSIS Most of the time, the direct cause of low back pain is not known.However, back pain can be treated effectively even when the exact cause of the pain is unknown.Answering your caregiver's questions about your overall health and symptoms is one of the most accurate ways to make sure the cause of your pain is not dangerous. If your caregiver needs more information, he or she may order lab work or imaging tests (X-rays or MRIs).However, even if imaging tests show changes in your back, this usually does not require surgery. HOME CARE INSTRUCTIONS For many people, back pain returns.Since low back pain is rarely dangerous, it is often a condition that people can learn to [facility] their own.   Remain active. It is stressful on the back to sit or stand in one place. Do not sit, drive, or stand in one place for more than 30 minutes at a time. Take short walks on level surfaces as soon as pain allows.Try to increase the length of time you walk each day.   Do not stay in bed.Resting more than 1 or 2 days can delay your recovery.   Do not avoid exercise or work.Your body is made to move.It is not dangerous to be active, even though your back may hurt.Your back will likely heal faster if you return to being active before your pain is gone.   Pay attention to your body when you bend and lift. Many people have less discomfortwhen lifting if they bend their knees, keep the load close to their  bodies,and avoid twisting. Often, the most comfortable positions are those that put less stress on your recovering back.   Find a comfortable position to sleep. Use a firm mattress and lie on your side with your knees slightly bent. If you lie on your back, put a pillow under your knees.   Only take over-the-counter or prescription medicines as directed by your caregiver. Over-the-counter medicines to reduce pain and inflammation are often the most helpful.Your caregiver may prescribe muscle relaxant drugs.These medicines help dull your pain so you can more quickly return to your normal activities and healthy exercise.   Put ice on the injured area.   Put ice in a plastic bag.   Place a towel between your skin and the bag.   Leave the ice on for 15 to 20 minutes, 3 to 4 times a day for the first 2 to 3 days. After that, ice and heat may be alternated to reduce pain and spasms.   Ask your caregiver about trying back exercises and gentle massage. This may be of some benefit.   Avoid feeling anxious or stressed.Stress increases muscle tension and can worsen back pain.It is important to recognize when you are anxious or stressed and learn ways to manage it.Exercise is a great option.  SEEK MEDICAL CARE IF:  You have pain that is not relieved with rest or medicine.   You have  pain that does not improve in 1 week.   You have new symptoms.   You are generally not feeling well.  SEEK IMMEDIATE MEDICAL CARE IF:   You have pain that radiates from your back into your legs.   You develop new bowel or bladder control problems.   You have unusual weakness or numbness in your arms or legs.   You develop nausea or vomiting.   You develop abdominal pain.   You feel faint.  Document Released: 07/17/2005 Document Revised: 07/06/2011 Document Reviewed: 12/05/2010 [facility] Patient Information 2012 [place], [place].  Today.  There was no specific cause identified for your low back pain and  syncopal episode.  Head CT is normal, your lab work or urine are normal.  The x-ray of your lower back is normal, your being treated for a lumbosacral strain with steroids, Valium, as an anti-spasmatic, and Vicodin for pain control.  Please take these medications as directed.  You've also been referred to Dr. Jerl Santos, who is an orthopedic surgeon, who can further evaluate for low back pain.  If this is not getting better

## 2012-05-19 ENCOUNTER — Emergency Department: Payer: Self-pay | Admitting: Emergency Medicine

## 2012-05-19 LAB — URINALYSIS, COMPLETE
Bacteria: NONE SEEN
Bilirubin,UR: NEGATIVE
Blood: NEGATIVE
Glucose,UR: NEGATIVE mg/dL (ref 0–75)
Leukocyte Esterase: NEGATIVE
Nitrite: NEGATIVE
Ph: 8 (ref 4.5–8.0)
RBC,UR: 1 /HPF (ref 0–5)

## 2012-05-19 LAB — COMPREHENSIVE METABOLIC PANEL
Alkaline Phosphatase: 85 U/L (ref 50–136)
Bilirubin,Total: 0.4 mg/dL (ref 0.2–1.0)
SGPT (ALT): 26 U/L (ref 12–78)
Total Protein: 7.8 g/dL (ref 6.4–8.2)

## 2012-05-19 LAB — CBC
HCT: 46.5 % (ref 35.0–47.0)
Platelet: 271 10*3/uL (ref 150–440)
RBC: 4.81 10*6/uL (ref 3.80–5.20)
RDW: 12.9 % (ref 11.5–14.5)
WBC: 8.2 10*3/uL (ref 3.6–11.0)

## 2012-06-17 ENCOUNTER — Encounter (HOSPITAL_COMMUNITY): Payer: Self-pay | Admitting: *Deleted

## 2012-06-17 ENCOUNTER — Emergency Department (HOSPITAL_COMMUNITY)
Admission: EM | Admit: 2012-06-17 | Discharge: 2012-06-17 | Disposition: A | Discharge status: Home / Self Care | Payer: Self-pay | Attending: Emergency Medicine | Admitting: Emergency Medicine

## 2012-06-17 DIAGNOSIS — Z87440 Personal history of urinary (tract) infections: Secondary | ICD-10-CM | POA: Insufficient documentation

## 2012-06-17 DIAGNOSIS — F17200 Nicotine dependence, unspecified, uncomplicated: Secondary | ICD-10-CM | POA: Insufficient documentation

## 2012-06-17 DIAGNOSIS — J45909 Unspecified asthma, uncomplicated: Secondary | ICD-10-CM | POA: Insufficient documentation

## 2012-06-17 DIAGNOSIS — N61 Mastitis without abscess: Secondary | ICD-10-CM | POA: Insufficient documentation

## 2012-06-17 MED ORDER — SULFAMETHOXAZOLE-TRIMETHOPRIM 800-160 MG PO TABS: 1.0000 | ORAL_TABLET | Freq: Two times a day (BID) | ORAL | Status: DC

## 2012-06-17 MED ORDER — TRAMADOL HCL 50 MG PO TABS: 50.0000 mg | ORAL_TABLET | Freq: Four times a day (QID) | ORAL | Status: DC | PRN

## 2012-06-17 MED ORDER — CEPHALEXIN 500 MG PO CAPS: 500.0000 mg | ORAL_CAPSULE | Freq: Two times a day (BID) | ORAL | Status: DC

## 2012-06-17 NOTE — ED Notes (Signed)
Pt is here with right breast knot that is draining green fluid adn knot has been there for about four weeks

## 2012-06-17 NOTE — ED Provider Notes (Signed)
History     CSN: [id number]  Arrival date & time 06/17/12  1625   First MD Initiated Contact with Patient 06/17/12 1735      Chief Complaint  Patient presents with  . right breast knot with drainage     (Consider location/radiation/quality/duration/timing/severity/associated sxs/prior treatment) HPI Pt with R breast pain x 1 month. Noticed greenish d/c from L nipple 2-3 days ago. No mass, swelling, fever, or constitutional symptoms.  Past Medical History  Diagnosis Date  . Abscess and cellulitis   . Asthma   . Tobacco use disorder   . UTI (lower urinary tract infection)     History reviewed. No pertinent past surgical history.  No family history on file.  History  Substance Use Topics  . Smoking status: Current Every Day Smoker -- 0.5 packs/day    Types: Cigarettes  . Smokeless tobacco: Never Used  . Alcohol Use: Yes     Comment: occasion    OB History    Grav Para Term Preterm Abortions TAB SAB Ect Mult Living                  Review of Systems  Constitutional: Negative for fever, chills and fatigue.  Respiratory: Negative for shortness of breath.   Cardiovascular: Negative for chest pain.  Gastrointestinal: Negative for nausea, vomiting and abdominal pain.  Musculoskeletal: Negative for back pain.  Skin: Positive for color change.    Allergies  Ibuprofen  Home Medications  No current outpatient prescriptions on file.  BP 115/73  Pulse 102  Temp 98 F (36.7 C) (Oral)  Resp 18  SpO2 98%  Physical Exam  Nursing note and vitals reviewed. Constitutional: She is oriented to person, place, and time. She appears well-developed and well-nourished. No distress.  HENT:  Head: Normocephalic and atraumatic.  Eyes: EOM are normal. Pupils are equal, round, and reactive to light.  Neck: Normal range of motion. Neck supple.  Cardiovascular: Normal rate and regular rhythm.   Pulmonary/Chest: Effort normal and breath sounds normal. No respiratory distress.  She has no wheezes. She has no rales.       Chaperone present for breast exam. Mild erythema around nipple. No masses, no nipple d/c.   Abdominal: Soft. Bowel sounds are normal. There is no tenderness. There is no rebound and no guarding.  Musculoskeletal: Normal range of motion. She exhibits no edema and no tenderness.  Neurological: She is alert and oriented to person, place, and time.  Skin: Skin is warm and dry. No rash noted. There is erythema.  Psychiatric: She has a normal mood and affect. Her behavior is normal.    ED Course  Procedures (including critical care time)   Labs Reviewed  PREGNANCY, URINE   No results found.   No diagnosis found.    MDM  I personally performed the services described in this documentation, which was scribed in my presence. The recorded information has been reviewed and is accurate.          Loren Racer, MD 06/17/12 2328

## 2012-06-17 NOTE — ED Notes (Signed)
NAD noted at time of d/c home 

## 2012-06-18 ENCOUNTER — Emergency Department: Payer: Self-pay | Admitting: Emergency Medicine

## 2012-06-19 NOTE — ED Notes (Signed)
Patient called with question about appointment.Current Scriber informed of D/C instructions.

## 2012-06-20 ENCOUNTER — Telehealth: Payer: Self-pay | Admitting: Oncology

## 2012-06-20 ENCOUNTER — Telehealth (HOSPITAL_COMMUNITY): Payer: Self-pay | Admitting: *Deleted

## 2012-06-20 NOTE — Telephone Encounter (Signed)
Telephoned patient at home # and left message to return call to [facility] 

## 2012-06-20 NOTE — Telephone Encounter (Signed)
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   She was referred to the [facility] by Dr. Ranae Palms.   Has a knot bilaterally- off and on for a month with green drainage coming from her nipples.   She has been seen in their [facility] and prescribed antibiotics but states she feels worse.   I explained that the [facility] sees patients diagnosed with breast center.  She has not had any radiology work up so I referred her to [employer].  Martie Lee will call her with an appt.

## 2012-06-21 ENCOUNTER — Encounter (HOSPITAL_COMMUNITY): Payer: Self-pay | Admitting: *Deleted

## 2012-07-05 ENCOUNTER — Encounter (HOSPITAL_COMMUNITY): Payer: Self-pay

## 2012-07-05 ENCOUNTER — Other Ambulatory Visit: Payer: Self-pay | Admitting: Obstetrics and Gynecology

## 2012-07-05 ENCOUNTER — Ambulatory Visit (HOSPITAL_COMMUNITY)
Admission: RE | Admit: 2012-07-05 | Discharge: 2012-07-05 | Disposition: A | Discharge status: Home / Self Care | Payer: Self-pay | Source: Ambulatory Visit | Attending: Obstetrics and Gynecology | Admitting: Obstetrics and Gynecology

## 2012-07-05 VITALS — BP 98/60 | Temp 98.7°F | Ht 60.0 in | Wt 144.8 lb

## 2012-07-05 DIAGNOSIS — N6452 Nipple discharge: Secondary | ICD-10-CM

## 2012-07-05 DIAGNOSIS — Z01419 Encounter for gynecological examination (general) (routine) without abnormal findings: Secondary | ICD-10-CM

## 2012-07-05 DIAGNOSIS — N898 Other specified noninflammatory disorders of vagina: Secondary | ICD-10-CM

## 2012-07-05 DIAGNOSIS — N644 Mastodynia: Secondary | ICD-10-CM

## 2012-07-05 HISTORY — DX: Cardiac murmur, unspecified: R01.1

## 2012-07-05 NOTE — Progress Notes (Addendum)
Patient complained of bilateral breast pain > right breast. Patient rated pain at a 7 out 10 right breast and a 2.5 out of 10 left breast. Patient complained of greenish colored spontaneous nipple discharge right breast and clear/yellowish colored nipple discharge left breast. Patient stated all of this has been going on for 3-4 weeks.  Pap Smear:    Pap smear completed today. Patient unsure if has ever had a Pap smear before. Patient stated if has had a Pap smear then has not had any abnormal Pap smears. No Pap smear results in EPIC.  Physical exam: Breasts Breasts symmetrical. Skin reddened under bilateral breasts at bra line. No nipple retraction bilateral breasts. No nipple discharge left breast expressed on exam. Greenish colored nipple discharge expressed from right breast on exam. Sample of right breast discharge sent to Cytology.  No lymphadenopathy. No lumps palpated bilateral breasts. Patient complained of pain left outer breast on exam and right outer breast on exam. Tenderness > left breast. Patient referred to the [facility] for bilateral breast ultrasound. Appointment scheduled for Monday, July 08, 2012 at 0930.          Pelvic/Bimanual   Ext Genitalia No lesions, no swelling and no discharge observed on external genitalia.         Vagina Vagina pink and normal texture. No lesions and white frothy discharge with odor observed in vagina. Wet prep completed.          Cervix Cervix is present. Cervix pink and of normal texture. White frothy discharge observed on cervical os.     Uterus Uterus is present and palpable. Uterus in normal position and normal size.        Adnexae Bilateral ovaries present and palpable. No tenderness on palpation.          Rectovaginal No rectal exam completed today since patient had no rectal complaints. No skin abnormalities observed on exam.

## 2012-07-05 NOTE — Patient Instructions (Signed)
Taught patient how to perform BSE and gave educational materials to take home. Let her know [facility] will cover Pap smears every 3 years unless has a history of abnormal Pap smears. Patient referred to the [facility] for bilateral breast ultrasound. Appointment scheduled for Monday, July 08, 2012 at 0930. Patient aware of appointment and will be there. Let patient know will follow up with her within the next couple weeks with results by letter or phone. Let her know should know results to wet prep the beginning of the week. Patient verbalized understanding.

## 2012-07-06 LAB — WET PREP, GENITAL
Clue Cells Wet Prep HPF POC: NONE SEEN
Trich, Wet Prep: NONE SEEN
Yeast Wet Prep HPF POC: NONE SEEN

## 2012-07-08 ENCOUNTER — Telehealth (HOSPITAL_COMMUNITY): Payer: Self-pay | Admitting: *Deleted

## 2012-07-08 ENCOUNTER — Other Ambulatory Visit: Payer: Self-pay

## 2012-07-08 NOTE — Telephone Encounter (Signed)
Pt called stating still has not started cycle and is now 8 days late. Advised pt would need to take pregnancy test to rule out pregnancy. Patient will call to reschedule breast ultrasound.

## 2012-07-09 ENCOUNTER — Other Ambulatory Visit: Payer: Self-pay

## 2012-07-21 ENCOUNTER — Emergency Department: Payer: Self-pay | Admitting: Emergency Medicine

## 2012-07-21 LAB — COMPREHENSIVE METABOLIC PANEL
Albumin: 3.5 g/dL (ref 3.4–5.0)
Alkaline Phosphatase: 69 U/L (ref 50–136)
BUN: 6 mg/dL — ABNORMAL LOW (ref 7–18)
Bilirubin,Total: 0.3 mg/dL (ref 0.2–1.0)
Co2: 23 mmol/L (ref 21–32)
Creatinine: 0.7 mg/dL (ref 0.60–1.30)
EGFR (Non-African Amer.): >60
Glucose: 82 mg/dL (ref 65–99)
SGPT (ALT): 20 U/L (ref 12–78)
Sodium: 141 mmol/L (ref 136–145)
Total Protein: 6.8 g/dL (ref 6.4–8.2)

## 2012-07-21 LAB — URINALYSIS, COMPLETE
Bilirubin,UR: NEGATIVE
Glucose,UR: NEGATIVE mg/dL (ref 0–75)
Ketone: NEGATIVE
Specific Gravity: 1.017 (ref 1.003–1.030)
WBC UR: 46 /HPF (ref 0–5)

## 2012-07-21 LAB — WET PREP, GENITAL

## 2012-07-21 LAB — CBC
MCH: 32.7 pg (ref 26.0–34.0)
MCHC: 35.3 g/dL (ref 32.0–36.0)
MCV: 93 fL (ref 80–100)
Platelet: 249 10*3/uL (ref 150–440)

## 2012-07-21 LAB — HCG, QUANTITATIVE, PREGNANCY: Beta Hcg, Quant.: 25370 m[IU]/mL — ABNORMAL HIGH

## 2012-07-31 NOTE — L&D Delivery Note (Signed)
I spoke with and examined patient and agree with resident's note and plan of care.  Tawana Scale, MD OB Fellow 02/27/2013 9:09 AM

## 2012-07-31 NOTE — L&D Delivery Note (Signed)
Delivery Note At 8:56 AM a viable and healthy female was delivered via  (Presentation: Left Occiput anterior;  ).  APGAR: 8, 9 ; weight pending.   Placenta status: intact, spontaneous.  Cord: 3 vessels with the following complications: none.   Anesthesia:   Episiotomy:  Lacerations: none Suture Repair: none Est. Blood Loss (mL): 300   Mom to postpartum.  Baby to nursery-stable.  Tawni Carnes 02/25/2013, 9:09 AM

## 2012-08-01 NOTE — Addendum Note (Signed)
Encounter addended by: Saintclair Halsted, RN on: 08/01/2012  4:52 PM<BR>     Documentation filed: Charges VN

## 2012-08-09 ENCOUNTER — Encounter (HOSPITAL_COMMUNITY): Payer: Self-pay

## 2012-08-09 ENCOUNTER — Inpatient Hospital Stay (HOSPITAL_COMMUNITY)
Admission: AD | Admit: 2012-08-09 | Discharge: 2012-08-09 | Disposition: A | Discharge status: Home / Self Care | Payer: Medicaid Other | Source: Ambulatory Visit | Attending: Obstetrics & Gynecology | Admitting: Obstetrics & Gynecology

## 2012-08-09 DIAGNOSIS — Z298 Encounter for other specified prophylactic measures: Secondary | ICD-10-CM | POA: Insufficient documentation

## 2012-08-09 DIAGNOSIS — Z2989 Encounter for other specified prophylactic measures: Secondary | ICD-10-CM | POA: Insufficient documentation

## 2012-08-09 DIAGNOSIS — R109 Unspecified abdominal pain: Secondary | ICD-10-CM | POA: Insufficient documentation

## 2012-08-09 DIAGNOSIS — O209 Hemorrhage in early pregnancy, unspecified: Secondary | ICD-10-CM

## 2012-08-09 DIAGNOSIS — O26859 Spotting complicating pregnancy, unspecified trimester: Secondary | ICD-10-CM | POA: Insufficient documentation

## 2012-08-09 LAB — URINALYSIS, ROUTINE W REFLEX MICROSCOPIC
Bilirubin Urine: NEGATIVE
Ketones, ur: NEGATIVE mg/dL
Leukocytes, UA: NEGATIVE
Nitrite: NEGATIVE
Protein, ur: NEGATIVE mg/dL
Urobilinogen, UA: 0.2 mg/dL (ref 0.0–1.0)
pH: 6 (ref 5.0–8.0)

## 2012-08-09 MED ORDER — RHO D IMMUNE GLOBULIN 1500 UNIT/2ML IJ SOLN
300.0000 ug | Freq: Once | INTRAMUSCULAR | Status: AC
  Administered 2012-08-09: 300 ug via INTRAMUSCULAR
  Filled 2012-08-09: qty 2

## 2012-08-09 MED ORDER — PRENATAL PLUS 27-1 MG PO TABS: 1.0000 | ORAL_TABLET | Freq: Every day | ORAL | Status: DC

## 2012-08-09 NOTE — MAU Note (Signed)
Patient states she has had an ultrasound at [facility] on 12-22. Results show 6 weeks 2 days with an EDC of 8-13. Patient states she has had some cramping on and off and occasional spotting.

## 2012-08-09 NOTE — MAU Provider Note (Signed)
History     CSN: [id number]  Arrival date and time: 08/09/12 1000   First Provider Initiated Contact with Patient 08/09/12 1056      Chief Complaint  Patient presents with  . Abdominal Cramping   HPI 27 y.o. [id number] @[redacted]w[redacted]d  presents with 2 day of spotting and lower abd cramping.  Spotting 2 days ago was enough to use pads but pt reports it has slowed down since that time.  Cramping still remains.  Patient denies other vaginal discharge, dysuria, foul odor, or rectal bleeding.    Past Medical History  Diagnosis Date  . Abscess and cellulitis   . Asthma   . Tobacco use disorder   . UTI (lower urinary tract infection)   . Heart murmur     History reviewed. No pertinent past surgical history.  Family History  Problem Relation Age of Onset  . Cancer Maternal Aunt 19    breast  . Cancer Paternal Aunt 70    breast  . Diabetes Paternal Grandmother   . Heart disease Paternal Grandmother     History  Substance Use Topics  . Smoking status: Current Every Day Smoker -- 0.5 packs/day for 9 years    Types: Cigarettes  . Smokeless tobacco: Never Used  . Alcohol Use: Yes     Comment: occasion    Allergies:  Allergies  Allergen Reactions  . Ibuprofen     Causes blisters    Prescriptions prior to admission  Medication Sig Dispense Refill  . cephALEXin (KEFLEX) 500 MG capsule Take 1 capsule (500 mg total) by mouth 2 (two) times daily.  20 capsule  0  . sulfamethoxazole-trimethoprim (SEPTRA DS) 800-160 MG per tablet Take 1 tablet by mouth 2 (two) times daily.  28 tablet  0  . traMADol (ULTRAM) 50 MG tablet Take 1 tablet (50 mg total) by mouth every 6 (six) hours as needed for pain.  15 tablet  0    Review of Systems  Constitutional: Negative for fever and chills.  Respiratory: Negative for cough.   Gastrointestinal: Positive for nausea, vomiting and abdominal pain (cramping). Negative for blood in stool.  Genitourinary: Negative for dysuria, urgency, frequency, hematuria  and flank pain.  Skin: Negative for itching and rash.  Neurological: Negative for dizziness.   Physical Exam   Blood pressure 111/72, pulse 84, temperature 99.2 F (37.3 C), temperature source Oral, resp. rate 16, height 5' (1.524 m), weight 143 lb 3.2 oz (64.955 kg), last menstrual period 05/31/2012.  Physical Exam  Constitutional: She is oriented to person, place, and time. She appears well-developed and well-nourished.  Eyes: Pupils are equal, round, and reactive to light.  Neck: Normal range of motion. Neck supple. No thyromegaly present.  Cardiovascular: Normal rate, regular rhythm, normal heart sounds and intact distal pulses.  Exam reveals no gallop and no friction rub.   No murmur heard. Respiratory: Effort normal and breath sounds normal.  GI: Soft. Bowel sounds are normal. She exhibits no distension. There is no tenderness.  Genitourinary: Vagina normal and uterus normal. There is no rash, tenderness or lesion on the right labia. There is no rash, tenderness or lesion on the left labia. Uterus is not enlarged and not tender. Cervix exhibits no motion tenderness, no discharge and no friability. Right adnexum displays no mass and no tenderness. Left adnexum displays no mass and no tenderness. No erythema, tenderness or bleeding around the vagina. No signs of injury around the vagina. No vaginal discharge found.  Musculoskeletal: Normal range of  motion. She exhibits edema (+1 bilateral hands and feet).  Neurological: She is alert and oriented to person, place, and time. She has normal reflexes.  Skin: Skin is warm and dry. No rash noted.  Psychiatric: She has a normal mood and affect.    MAU Course  Procedures Ultrasound showed fetal heart beat and fetal movement. (Bedside US by DP)  Assessment and Plan  A: bleeding and cramping at [redacted]w[redacted]d.  Pt had G&C checked at previous clinic visit on 07/05/12 with stated results as negative. No bleeding noted on pelvic exam. P: RhoGam to be given  today and have her follow up in our OB clinic for prenatal care  Henningsgaard, Bradley 08/09/2012, 11:33 AM  Evaluation and management procedures were performed by PA-S under my supervision/collaboration. Chart reviewed, patient examined by me and I agree with management and plan. Will refer to WOC. WIll eed results of GC/CT done at [facility]. Pregnancy verification letter and Rx PNVs. Danae Orleans, CNM 08/09/2012 12:47 PM

## 2012-08-10 LAB — RH IG WORKUP (INCLUDES ABO/RH): Unit division: 0

## 2012-08-23 ENCOUNTER — Other Ambulatory Visit (HOSPITAL_COMMUNITY): Payer: Self-pay | Admitting: Obstetrics and Gynecology

## 2012-09-01 ENCOUNTER — Inpatient Hospital Stay (HOSPITAL_COMMUNITY)
Admission: AD | Admit: 2012-09-01 | Discharge: 2012-09-01 | Disposition: A | Discharge status: Home / Self Care | Payer: Medicaid Other | Source: Ambulatory Visit | Attending: Obstetrics & Gynecology | Admitting: Obstetrics & Gynecology

## 2012-09-01 ENCOUNTER — Encounter (HOSPITAL_COMMUNITY): Payer: Self-pay | Admitting: Family

## 2012-09-01 DIAGNOSIS — N760 Acute vaginitis: Secondary | ICD-10-CM | POA: Insufficient documentation

## 2012-09-01 DIAGNOSIS — N949 Unspecified condition associated with female genital organs and menstrual cycle: Secondary | ICD-10-CM | POA: Insufficient documentation

## 2012-09-01 DIAGNOSIS — B9689 Other specified bacterial agents as the cause of diseases classified elsewhere: Secondary | ICD-10-CM | POA: Insufficient documentation

## 2012-09-01 DIAGNOSIS — O2340 Unspecified infection of urinary tract in pregnancy, unspecified trimester: Secondary | ICD-10-CM

## 2012-09-01 DIAGNOSIS — N390 Urinary tract infection, site not specified: Secondary | ICD-10-CM | POA: Insufficient documentation

## 2012-09-01 DIAGNOSIS — A499 Bacterial infection, unspecified: Secondary | ICD-10-CM

## 2012-09-01 DIAGNOSIS — R109 Unspecified abdominal pain: Secondary | ICD-10-CM | POA: Insufficient documentation

## 2012-09-01 DIAGNOSIS — O2390 Unspecified genitourinary tract infection in pregnancy, unspecified trimester: Secondary | ICD-10-CM | POA: Insufficient documentation

## 2012-09-01 LAB — URINE MICROSCOPIC-ADD ON

## 2012-09-01 LAB — WET PREP, GENITAL: Yeast Wet Prep HPF POC: NONE SEEN

## 2012-09-01 LAB — URINALYSIS, ROUTINE W REFLEX MICROSCOPIC
Bilirubin Urine: NEGATIVE
Glucose, UA: NEGATIVE mg/dL
Ketones, ur: NEGATIVE mg/dL
Nitrite: NEGATIVE
Specific Gravity, Urine: <1.005 — ABNORMAL LOW (ref 1.005–1.030)
pH: 7 (ref 5.0–8.0)

## 2012-09-01 MED ORDER — CEPHALEXIN 500 MG PO CAPS: 500.0000 mg | ORAL_CAPSULE | Freq: Two times a day (BID) | ORAL | Status: DC

## 2012-09-01 MED ORDER — METRONIDAZOLE 500 MG PO TABS: 500.0000 mg | ORAL_TABLET | Freq: Two times a day (BID) | ORAL | Status: DC

## 2012-09-01 NOTE — MAU Provider Note (Signed)
History     CSN: [id number]  Arrival date & time 09/01/12  1743   None     Chief Complaint  Patient presents with  . Abdominal Cramping    (Consider location/radiation/quality/duration/timing/severity/associated sxs/prior treatment) HPI Eileen Tate is a 27 y.o. G3P2002 at [redacted]w[redacted]d. She presents with c/o low abd pressure/cramping off/on x 2 days. Has thin vaginal discharge with odor like with BV in the past. Last tx was 1 m ago,resolved, but symptoms returning. She has mid back pain off/on also. No bleeding, UTI S&S or GI changes.  Plans PNC in [facility] clinics, Medicaid still pending. Pt requests U/S.  Past Medical History  Diagnosis Date  . Abscess and cellulitis   . Asthma   . Tobacco use disorder   . UTI (lower urinary tract infection)   . Heart murmur     History reviewed. No pertinent past surgical history.  Family History  Problem Relation Age of Onset  . Cancer Maternal Aunt 19    breast  . Cancer Paternal Aunt 30    breast  . Diabetes Paternal Grandmother   . Heart disease Paternal Grandmother     History  Substance Use Topics  . Smoking status: Current Every Day Smoker -- 0.5 packs/day for 9 years    Types: Cigarettes  . Smokeless tobacco: Never Used  . Alcohol Use: Yes     Comment: occasion    OB History    Grav Para Term Preterm Abortions TAB SAB Ect Mult Living   3 2 2       2       Review of Systems  Constitutional: Negative for fever and chills.  Gastrointestinal: Negative for nausea, vomiting, diarrhea and constipation.  Genitourinary: Positive for flank pain, vaginal discharge and pelvic pain. Negative for dysuria, urgency, frequency and vaginal bleeding.    Allergies  Ibuprofen  Home Medications  No current outpatient prescriptions on file.  BP 109/68  Pulse 100  Temp 98.1 F (36.7 C) (Oral)  Resp 18  Ht 5' (1.524 m)  Wt 147 lb (66.679 kg)  BMI 28.71 kg/m2  LMP 05/31/2012  Physical Exam  Constitutional: She is oriented to person,  place, and time. She appears well-developed and well-nourished.  Abdominal: Soft. She exhibits no mass. There is tenderness. There is no rebound and no guarding.  Genitourinary:       Pelvic exam: Ext gen- nl anatomy, skin intact Vagina- small amt thin white discharge Cx- closed Uterus- 12-14 wk size, + FHT's And- mildly tender bil  Musculoskeletal: Normal range of motion.  Neurological: She is alert and oriented to person, place, and time.  Skin: Skin is warm and dry.  Psychiatric: She has a normal mood and affect. Her behavior is normal.    ED Course  Procedures (including critical care time)   Labs Reviewed  GC/CHLAMYDIA PROBE AMP  WET PREP, GENITAL  URINALYSIS, ROUTINE W REFLEX MICROSCOPIC   Results for orders placed during the hospital encounter of 09/01/12 (from the past 24 hour(s))  WET PREP, GENITAL     Status: Abnormal   Collection Time   09/01/12  6:31 PM      Component Value Range   Yeast Wet Prep HPF POC NONE SEEN  NONE SEEN   Trich, Wet Prep NONE SEEN  NONE SEEN   Clue Cells Wet Prep HPF POC MODERATE (*) NONE SEEN   WBC, Wet Prep HPF POC MODERATE (*) NONE SEEN  URINALYSIS, ROUTINE W REFLEX MICROSCOPIC  Status: Abnormal   Collection Time   09/01/12  6:32 PM      Component Value Range   Color, Urine YELLOW  YELLOW   APPearance CLEAR  CLEAR   Specific Gravity, Urine <1.005 (*) 1.005 - 1.030   pH 7.0  5.0 - 8.0   Glucose, UA NEGATIVE  NEGATIVE mg/dL   Hgb urine dipstick NEGATIVE  NEGATIVE   Bilirubin Urine NEGATIVE  NEGATIVE   Ketones, ur NEGATIVE  NEGATIVE mg/dL   Protein, ur NEGATIVE  NEGATIVE mg/dL   Urobilinogen, UA 0.2  0.0 - 1.0 mg/dL   Nitrite NEGATIVE  NEGATIVE   Leukocytes, UA SMALL (*) NEGATIVE  URINE MICROSCOPIC-ADD ON     Status: Abnormal   Collection Time   09/01/12  6:32 PM      Component Value Range   Squamous Epithelial / LPF FEW (*) RARE   WBC, UA 3-6  <3 WBC/hpf   RBC / HPF 0-2  <3 RBC/hpf   Bacteria, UA MANY (*) RARE    ASSESSMENT:   Urinary tract infection Bacterial vaginosis  12+wks EGA   PLAN: Keflex 500 bid x 7 Flagyl 500 bid x7 Make an appt for prenatal care when gets [facility] Out pt U/S to be scheduled this week for dating    MDM

## 2012-09-01 NOTE — MAU Note (Signed)
Eileen Tate is here in MAU with lower abdominal cramping. She is [redacted]w[redacted]d; the cramping started yesterday and "comes and goes". She called the nurse line and they told her to be evaluated. She denies bleeding; however complains of an odorous, thin discharge.

## 2012-09-01 NOTE — MAU Note (Signed)
Patient presents to MAU with c/o lower abdominal pain with "cervical pain", with odorous clear discharge. Denies vaginal bleeding. Reports feeling + fetal movement/flutters.

## 2012-09-02 LAB — GC/CHLAMYDIA PROBE AMP
CT Probe RNA: NEGATIVE
GC Probe RNA: NEGATIVE

## 2012-09-03 LAB — URINE CULTURE

## 2012-09-03 NOTE — MAU Provider Note (Signed)
Attestation of Attending Supervision of Advanced Practitioner (CNM/NP): Evaluation and management procedures were performed by the Advanced Practitioner under my supervision and collaboration. I have reviewed the Advanced Practitioner's note and chart, and I agree with the management and plan.  Virga Haltiwanger H. 9:20 PM

## 2012-09-05 ENCOUNTER — Ambulatory Visit (HOSPITAL_COMMUNITY)
Admission: RE | Admit: 2012-09-05 | Discharge: 2012-09-05 | Disposition: A | Discharge status: Home / Self Care | Payer: Medicaid Other | Source: Ambulatory Visit | Attending: Obstetrics and Gynecology | Admitting: Obstetrics and Gynecology

## 2012-09-05 ENCOUNTER — Inpatient Hospital Stay (HOSPITAL_COMMUNITY)
Admission: AD | Admit: 2012-09-05 | Discharge: 2012-09-05 | Disposition: A | Discharge status: Home / Self Care | Payer: Medicaid Other | Source: Ambulatory Visit | Attending: Internal Medicine | Admitting: Internal Medicine

## 2012-09-05 DIAGNOSIS — Z3689 Encounter for other specified antenatal screening: Secondary | ICD-10-CM | POA: Insufficient documentation

## 2012-09-05 DIAGNOSIS — Z3490 Encounter for supervision of normal pregnancy, unspecified, unspecified trimester: Secondary | ICD-10-CM

## 2012-09-05 NOTE — ED Notes (Signed)
M.Williams, CNM discussed results and F/U care with pt and husband.

## 2012-09-05 NOTE — MAU Provider Note (Signed)
This is a 27 y.o. female at [redacted]w[redacted]d who presents from Radiology for review of [place] results.  This was actually ordered as an outpatient [place].  US Ob Comp Less 14 Wks    Informed patient of results.  She is still deciding which practice to go to.

## 2012-09-17 ENCOUNTER — Encounter (HOSPITAL_COMMUNITY): Payer: Self-pay | Admitting: Emergency Medicine

## 2012-09-17 ENCOUNTER — Other Ambulatory Visit: Payer: Self-pay

## 2012-09-17 ENCOUNTER — Emergency Department (HOSPITAL_COMMUNITY)
Admission: EM | Admit: 2012-09-17 | Discharge: 2012-09-17 | Disposition: A | Discharge status: Home / Self Care | Payer: Medicaid Other | Attending: Emergency Medicine | Admitting: Emergency Medicine

## 2012-09-17 DIAGNOSIS — Z87440 Personal history of urinary (tract) infections: Secondary | ICD-10-CM | POA: Insufficient documentation

## 2012-09-17 DIAGNOSIS — R011 Cardiac murmur, unspecified: Secondary | ICD-10-CM | POA: Insufficient documentation

## 2012-09-17 DIAGNOSIS — Z872 Personal history of diseases of the skin and subcutaneous tissue: Secondary | ICD-10-CM | POA: Insufficient documentation

## 2012-09-17 DIAGNOSIS — Z79899 Other long term (current) drug therapy: Secondary | ICD-10-CM | POA: Insufficient documentation

## 2012-09-17 DIAGNOSIS — R630 Anorexia: Secondary | ICD-10-CM | POA: Insufficient documentation

## 2012-09-17 DIAGNOSIS — R079 Chest pain, unspecified: Secondary | ICD-10-CM | POA: Insufficient documentation

## 2012-09-17 DIAGNOSIS — M79606 Pain in leg, unspecified: Secondary | ICD-10-CM

## 2012-09-17 DIAGNOSIS — M79609 Pain in unspecified limb: Secondary | ICD-10-CM | POA: Insufficient documentation

## 2012-09-17 DIAGNOSIS — J45909 Unspecified asthma, uncomplicated: Secondary | ICD-10-CM | POA: Insufficient documentation

## 2012-09-17 DIAGNOSIS — M549 Dorsalgia, unspecified: Secondary | ICD-10-CM | POA: Insufficient documentation

## 2012-09-17 DIAGNOSIS — O9989 Other specified diseases and conditions complicating pregnancy, childbirth and the puerperium: Secondary | ICD-10-CM | POA: Insufficient documentation

## 2012-09-17 DIAGNOSIS — O219 Vomiting of pregnancy, unspecified: Secondary | ICD-10-CM | POA: Insufficient documentation

## 2012-09-17 DIAGNOSIS — O99330 Smoking (tobacco) complicating pregnancy, unspecified trimester: Secondary | ICD-10-CM | POA: Insufficient documentation

## 2012-09-17 LAB — POCT I-STAT TROPONIN I: Troponin i, poc: 0.01 ng/mL (ref 0.00–0.08)

## 2012-09-17 LAB — CBC WITH DIFFERENTIAL/PLATELET
Basophils Relative: 0 % (ref 0–1)
Eosinophils Absolute: 0.1 10*3/uL (ref 0.0–0.7)
Lymphs Abs: 1.4 10*3/uL (ref 0.7–4.0)
MCH: 32.7 pg (ref 26.0–34.0)
Neutrophils Relative %: 79 % — ABNORMAL HIGH (ref 43–77)
Platelets: 224 10*3/uL (ref 150–400)
RBC: 4.04 MIL/uL (ref 3.87–5.11)

## 2012-09-17 LAB — POCT I-STAT, CHEM 8
Calcium, Ion: 1.23 mmol/L (ref 1.12–1.23)
Chloride: 103 mEq/L (ref 96–112)
HCT: 38 % (ref 36.0–46.0)
Sodium: 136 mEq/L (ref 135–145)
TCO2: 24 mmol/L (ref 0–100)

## 2012-09-17 LAB — D-DIMER, QUANTITATIVE: D-Dimer, Quant: 0.3 ug/mL-FEU (ref 0.00–0.48)

## 2012-09-17 MED ORDER — FAMOTIDINE 20 MG PO TABS: 20.0000 mg | ORAL_TABLET | Freq: Two times a day (BID) | ORAL | Status: DC

## 2012-09-17 MED ORDER — ACETAMINOPHEN 325 MG PO TABS
650.0000 mg | ORAL_TABLET | Freq: Once | ORAL | Status: AC
  Administered 2012-09-17: 650 mg via ORAL
  Filled 2012-09-17: qty 2

## 2012-09-17 MED ORDER — SODIUM CHLORIDE 0.9 % IV BOLUS (SEPSIS)
1000.0000 mL | Freq: Once | INTRAVENOUS | Status: AC
  Administered 2012-09-17: 1000 mL via INTRAVENOUS

## 2012-09-17 NOTE — ED Provider Notes (Signed)
History     CSN: [id number]  Arrival date & time 09/17/12  1431   First MD Initiated Contact with Patient 09/17/12 1531      Chief Complaint  Patient presents with  . Chest Pain  . Leg Pain    (Consider location/radiation/quality/duration/timing/severity/associated sxs/prior treatment) HPI  The patient presents with chest pain, back pain, right-sided leg pain.  She states the pain began approximately 2 days ago.  Since onset the pain is present more often than not, though is not consistent.  The pain is about the upper lumbar back diffusely, and the sternum and epigastrium.  The pain is sharp, burning.  The patient continues to have nausea, vomiting, decreased appetite.  Chest pain about the lateral aspect of her right leg.  The pain is worse with activity, there is no associated superficial changes, edema, loss of sensation or strength in the leg. The patient denies new dyspnea, nuclear reticulocyte pain. This is her third pregnancy.  She has seen obstetrics first pregnancy, but has no ongoing prenatal care.   Past Medical History  Diagnosis Date  . Abscess and cellulitis   . Asthma   . Tobacco use disorder   . UTI (lower urinary tract infection)   . Heart murmur     History reviewed. No pertinent past surgical history.  Family History  Problem Relation Age of Onset  . Cancer Maternal Aunt 19    breast  . Cancer Paternal Aunt 44    breast  . Diabetes Paternal Grandmother   . Heart disease Paternal Grandmother     History  Substance Use Topics  . Smoking status: Current Every Day Smoker -- 0.50 packs/day for 9 years    Types: Cigarettes  . Smokeless tobacco: Never Used  . Alcohol Use: Yes     Comment: occasion    OB History   Grav Para Term Preterm Abortions TAB SAB Ect Mult Living   3 2 2       2       Review of Systems  Constitutional:       Per HPI, otherwise negative  HENT:       Per HPI, otherwise negative  Respiratory:       Per HPI, otherwise  negative  Cardiovascular:       Per HPI, otherwise negative  Gastrointestinal: Negative for vomiting.  Endocrine:       Negative aside from HPI  Genitourinary: Negative for dysuria, frequency, vaginal bleeding, vaginal discharge and pelvic pain.  Musculoskeletal:       Per HPI, otherwise negative  Skin: Negative.   Neurological: Negative for syncope.    Allergies  Ibuprofen  Home Medications   Current Outpatient Rx  Name  Route  Sig  Dispense  Refill  . prenatal vitamin w/FE, FA (PRENATAL 1 + 1) 27-1 MG TABS      TAKE ONE TABLET BY MOUTH EVERY DAY   30 each   0   . levalbuterol (XOPENEX HFA) 45 MCG/ACT inhaler   Inhalation   Inhale 1 puff into the lungs daily as needed. For shortness of breath           BP 102/60  Pulse 79  Temp(Src) 98.2 F (36.8 C) (Oral)  Resp 20  SpO2 100%  LMP 05/31/2012  Physical Exam  Nursing note and vitals reviewed. Constitutional: She is oriented to person, place, and time. She appears well-developed and well-nourished. No distress.  HENT:  Head: Normocephalic and atraumatic.  Eyes: Conjunctivae and EOM  are normal.  Cardiovascular: Normal rate and regular rhythm.   Pulmonary/Chest: Effort normal and breath sounds normal. No stridor. No respiratory distress.  Abdominal: She exhibits no distension.  Gravid abdomen, nontender  Musculoskeletal: She exhibits no edema.       Right hip: Normal.       Right knee: Normal.       Right ankle: Normal.  Neurological: She is alert and oriented to person, place, and time. No cranial nerve deficit.  Skin: Skin is warm and dry.  Psychiatric: She has a normal mood and affect.    ED Course  [place] bedside Date/Time: 09/17/2012 5:37 PM Performed by: Gerhard Munch Authorized by: Gerhard Munch Consent: Verbal consent obtained. Risks and benefits: risks, benefits and alternatives were discussed Consent given by: patient Patient understanding: patient states understanding of the procedure being  performed Patient consent: the patient's understanding of the procedure matches consent given Procedure consent: procedure consent matches procedure scheduled Relevant documents: relevant documents present and verified Test results: test results available and properly labeled Site marked: the operative site was marked Imaging studies: imaging studies available Patient identity confirmed: verbally with patient Time out: Immediately prior to procedure a "time out" was called to verify the correct patient, procedure, equipment, support staff and site/side marked as required. Preparation: Patient was prepped and draped in the usual sterile fashion. Local anesthesia used: no Patient sedated: no Patient tolerance: Patient tolerated the procedure well with no immediate complications. Comments: FHT 160's w good motion, no surrounding fluid   (including critical care time)  Labs Reviewed  CBC WITH DIFFERENTIAL - Abnormal; Notable for the following:    MCHC 36.3 (*)    Neutrophils Relative 79 (*)    All other components within normal limits  D-DIMER, QUANTITATIVE  POCT I-STAT, CHEM 8  POCT I-STAT TROPONIN I   No results found.   No diagnosis found.   Date: 09/17/2012  Rate: 125  Rhythm: sinus tachycardia  QRS Axis: normal  Intervals: normal  ST/T Wave abnormalities: nonspecific T wave changes  Conduction Disutrbances:none  Narrative Interpretation:   Old EKG Reviewed: changes noted FASTER - abnormal    MDM  This female G3, P1 at approximately 16 weeks presents with ongoing chest pain, back pain, right leg pain.  Given the patient's initial tachycardia, there is some suspicion, though low for thromboembolic phenomena.  The patient then was negative, and she was in no distress, which is reassuring.  The patient's leg is unremarkable, with full range of motion, appropriately strength throughout.  Given the patient's description of sternal discomfort, back pain or suspicion of  esophageal or gastric etiology.  Ultrasound was also reassuring, as was ECG.  The patient has a followup appointment with obstetrics in the morning and was started on antacid medication, discharged in stable condition   Gerhard Munch, MD 09/17/12 1746

## 2012-09-17 NOTE — ED Notes (Signed)
Pt c/o mid sternal and epigastric pain x 2 days; pt [redacted] weeks pregnant with some morning sickness and vomiting; pt sts right leg pain and aches; pt sts pain through to back; pt denies bleeding or abd pain

## 2012-09-18 ENCOUNTER — Encounter: Payer: Self-pay | Admitting: Family Medicine

## 2012-09-18 ENCOUNTER — Encounter: Payer: Self-pay | Admitting: Obstetrics & Gynecology

## 2012-09-18 ENCOUNTER — Other Ambulatory Visit: Payer: Self-pay | Admitting: Obstetrics & Gynecology

## 2012-09-18 ENCOUNTER — Ambulatory Visit (INDEPENDENT_AMBULATORY_CARE_PROVIDER_SITE_OTHER): Payer: Medicaid Other | Admitting: Obstetrics & Gynecology

## 2012-09-18 VITALS — BP 100/67 | Temp 97.7°F | Ht 59.5 in | Wt 144.0 lb

## 2012-09-18 DIAGNOSIS — Z23 Encounter for immunization: Secondary | ICD-10-CM

## 2012-09-18 DIAGNOSIS — M549 Dorsalgia, unspecified: Secondary | ICD-10-CM

## 2012-09-18 DIAGNOSIS — Z3480 Encounter for supervision of other normal pregnancy, unspecified trimester: Secondary | ICD-10-CM

## 2012-09-18 DIAGNOSIS — Z3490 Encounter for supervision of normal pregnancy, unspecified, unspecified trimester: Secondary | ICD-10-CM

## 2012-09-18 LAB — POCT URINALYSIS DIP (DEVICE)
Bilirubin Urine: NEGATIVE
Nitrite: NEGATIVE
Protein, ur: NEGATIVE mg/dL
pH: 7 (ref 5.0–8.0)

## 2012-09-18 MED ORDER — CYCLOBENZAPRINE HCL 10 MG PO TABS: 10.0000 mg | ORAL_TABLET | Freq: Three times a day (TID) | ORAL | Status: DC | PRN

## 2012-09-18 MED ORDER — INFLUENZA VIRUS VACC SPLIT PF IM SUSP
0.5000 mL | Freq: Once | INTRAMUSCULAR | Status: AC
  Administered 2012-09-18: 0.5 mL via INTRAMUSCULAR

## 2012-09-18 NOTE — Progress Notes (Signed)
Pulse 92 Edema trace in ankles.

## 2012-09-18 NOTE — Patient Instructions (Signed)
Pregnancy - Second Trimester The second trimester of pregnancy (3 to 6 months) is a period of rapid growth for you and your baby. At the end of the sixth month, your baby is about 9 inches long and weighs 1 1/2 pounds. You will begin to feel the baby move between 18 and 20 weeks of the pregnancy. This is called quickening. Weight gain is faster. A clear fluid (colostrum) may leak out of your breasts. You may feel small contractions of the womb (uterus). This is known as false labor or Braxton-Hicks contractions. This is like a practice for labor when the baby is ready to be born. Usually, the problems with morning sickness have usually passed by the end of your first trimester. Some women develop small dark blotches (called cholasma, mask of pregnancy) on their face that usually goes away after the baby is born. Exposure to the sun makes the blotches worse. Acne may also develop in some pregnant women and pregnant women who have acne, may find that it goes away. PRENATAL EXAMS  Blood work may continue to be done during prenatal exams. These tests are done to check on your health and the probable health of your baby. Blood work is used to follow your blood levels (hemoglobin). Anemia (low hemoglobin) is common during pregnancy. Iron and vitamins are given to help prevent this. You will also be checked for diabetes between 24 and 28 weeks of the pregnancy. Some of the previous blood tests may be repeated.  The size of the uterus is measured during each visit. This is to make sure that the baby is continuing to grow properly according to the dates of the pregnancy.  Your blood pressure is checked every prenatal visit. This is to make sure you are not getting toxemia.  Your urine is checked to make sure you do not have an infection, diabetes or protein in the urine.  Your weight is checked often to make sure gains are happening at the suggested rate. This is to ensure that both you and your baby are growing  normally.  Sometimes, an ultrasound is performed to confirm the proper growth and development of the baby. This is a test which bounces harmless sound waves off the baby so your caregiver can more accurately determine due dates. Sometimes, a specialized test is done on the amniotic fluid surrounding the baby. This test is called an amniocentesis. The amniotic fluid is obtained by sticking a needle into the belly (abdomen). This is done to check the chromosomes in instances where there is a concern about possible genetic problems with the baby. It is also sometimes done near the end of pregnancy if an early delivery is required. In this case, it is done to help make sure the baby's lungs are mature enough for the baby to live outside of the womb. CHANGES OCCURING IN THE SECOND TRIMESTER OF PREGNANCY Your body goes through many changes during pregnancy. They vary from person to person. Talk to your caregiver about changes you notice that you are concerned about.  During the second trimester, you will likely have an increase in your appetite. It is normal to have cravings for certain foods. This varies from person to person and pregnancy to pregnancy.  Your lower abdomen will begin to bulge.  You may have to urinate more often because the uterus and baby are pressing on your bladder. It is also common to get more bladder infections during pregnancy (pain with urination). You can help this by   drinking lots of fluids and emptying your bladder before and after intercourse.  You may begin to get stretch marks on your hips, abdomen, and breasts. These are normal changes in the body during pregnancy. There are no exercises or medications to take that prevent this change.  You may begin to develop swollen and bulging veins (varicose veins) in your legs. Wearing support hose, elevating your feet for 15 minutes, 3 to 4 times a day and limiting salt in your diet helps lessen the problem.  Heartburn may develop  as the uterus grows and pushes up against the stomach. Antacids recommended by your caregiver helps with this problem. Also, eating smaller meals 4 to 5 times a day helps.  Constipation can be treated with a stool softener or adding bulk to your diet. Drinking lots of fluids, vegetables, fruits, and whole grains are helpful.  Exercising is also helpful. If you have been very active up until your pregnancy, most of these activities can be continued during your pregnancy. If you have been less active, it is helpful to start an exercise program such as walking.  Hemorrhoids (varicose veins in the rectum) may develop at the end of the second trimester. Warm sitz baths and hemorrhoid cream recommended by your caregiver helps hemorrhoid problems.  Backaches may develop during this time of your pregnancy. Avoid heavy lifting, wear low heal shoes and practice good posture to help with backache problems.  Some pregnant women develop tingling and numbness of their hand and fingers because of swelling and tightening of ligaments in the wrist (carpel tunnel syndrome). This goes away after the baby is born.  As your breasts enlarge, you may have to get a bigger bra. Get a comfortable, cotton, support bra. Do not get a nursing bra until the last month of the pregnancy if you will be nursing the baby.  You may get a dark line from your belly button to the pubic area called the linea nigra.  You may develop rosy cheeks because of increase blood flow to the face.  You may develop spider looking lines of the face, neck, arms and chest. These go away after the baby is born. HOME CARE INSTRUCTIONS   It is extremely important to avoid all smoking, herbs, alcohol, and unprescribed drugs during your pregnancy. These chemicals affect the formation and growth of the baby. Avoid these chemicals throughout the pregnancy to ensure the delivery of a healthy infant.  Most of your home care instructions are the same as  suggested for the first trimester of your pregnancy. Keep your caregiver's appointments. Follow your caregiver's instructions regarding medication use, exercise and diet.  During pregnancy, you are providing food for you and your baby. Continue to eat regular, well-balanced meals. Choose foods such as meat, fish, milk and other low fat dairy products, vegetables, fruits, and whole-grain breads and cereals. Your caregiver will tell you of the ideal weight gain.  A physical sexual relationship may be continued up until near the end of pregnancy if there are no other problems. Problems could include early (premature) leaking of amniotic fluid from the membranes, vaginal bleeding, abdominal pain, or other medical or pregnancy problems.  Exercise regularly if there are no restrictions. Check with your caregiver if you are unsure of the safety of some of your exercises. The greatest weight gain will occur in the last 2 trimesters of pregnancy. Exercise will help you:  Control your weight.  Get you in shape for labor and delivery.  Lose weight   after you have the baby.  Wear a good support or jogging bra for breast tenderness during pregnancy. This may help if worn during sleep. Pads or tissues may be used in the bra if you are leaking colostrum.  Do not use hot tubs, steam rooms or saunas throughout the pregnancy.  Wear your seat belt at all times when driving. This protects you and your baby if you are in an accident.  Avoid raw meat, uncooked cheese, cat litter boxes and soil used by cats. These carry germs that can cause birth defects in the baby.  The second trimester is also a good time to visit your dentist for your dental health if this has not been done yet. Getting your teeth cleaned is OK. Use a soft toothbrush. Brush gently during pregnancy.  It is easier to loose urine during pregnancy. Tightening up and strengthening the pelvic muscles will help with this problem. Practice stopping your  urination while you are going to the bathroom. These are the same muscles you need to strengthen. It is also the muscles you would use as if you were trying to stop from passing gas. You can practice tightening these muscles up 10 times a set and repeating this about 3 times per day. Once you know what muscles to tighten up, do not perform these exercises during urination. It is more likely to contribute to an infection by backing up the urine.  Ask for help if you have financial, counseling or nutritional needs during pregnancy. Your caregiver will be able to offer counseling for these needs as well as refer you for other special needs.  Your skin may become oily. If so, wash your face with mild soap, use non-greasy moisturizer and oil or cream based makeup. MEDICATIONS AND DRUG USE IN PREGNANCY  Take prenatal vitamins as directed. The vitamin should contain 1 milligram of folic acid. Keep all vitamins out of reach of children. Only a couple vitamins or tablets containing iron may be fatal to a baby or young child when ingested.  Avoid use of all medications, including herbs, over-the-counter medications, not prescribed or suggested by your caregiver. Only take over-the-counter or prescription medicines for pain, discomfort, or fever as directed by your caregiver. Do not use aspirin.  Let your caregiver also know about herbs you may be using.  Alcohol is related to a number of birth defects. This includes fetal alcohol syndrome. All alcohol, in any form, should be avoided completely. Smoking will cause low birth rate and premature babies.  Street or illegal drugs are very harmful to the baby. They are absolutely forbidden. A baby born to an addicted mother will be addicted at birth. The baby will go through the same withdrawal an adult does. SEEK MEDICAL CARE IF:  You have any concerns or worries during your pregnancy. It is better to call with your questions if you feel they cannot wait, rather  than worry about them. SEEK IMMEDIATE MEDICAL CARE IF:   An unexplained oral temperature above 102 F (38.9 C) develops, or as your caregiver suggests.  You have leaking of fluid from the vagina (birth canal). If leaking membranes are suspected, take your temperature and tell your caregiver of this when you call.  There is vaginal spotting, bleeding, or passing clots. Tell your caregiver of the amount and how many pads are used. Light spotting in pregnancy is common, especially following intercourse.  You develop a bad smelling vaginal discharge with a change in the color from clear   to white.  You continue to feel sick to your stomach (nauseated) and have no relief from remedies suggested. You vomit blood or coffee ground-like materials.  You lose more than 2 pounds of weight or gain more than 2 pounds of weight over 1 week, or as suggested by your caregiver.  You notice swelling of your face, hands, feet, or legs.  You get exposed to German measles and have never had them.  You are exposed to fifth disease or chickenpox.  You develop belly (abdominal) pain. Round ligament discomfort is a common non-cancerous (benign) cause of abdominal pain in pregnancy. Your caregiver still must evaluate you.  You develop a bad headache that does not go away.  You develop fever, diarrhea, pain with urination, or shortness of breath.  You develop visual problems, blurry, or double vision.  You fall or are in a car accident or any kind of trauma.  There is mental or physical violence at home. Document Released: 07/11/2001 Document Revised: 10/09/2011 Document Reviewed: 01/13/2009 [employer] Patient Information 2013 [employer].  

## 2012-09-18 NOTE — Progress Notes (Signed)
   Subjective:    Eileen Tate is a [id number] being seen today for her first obstetrical visit.  Her obstetrical history is significant for recent LGSIL pap smear in 06/2012, no colposcopy yet. Patient does not intend to breast feed. Pregnancy history fully reviewed.  Patient reports worsening low back pain since pregnancy.  Tylenol alone not working.  Filed Vitals:   09/18/12 1334 09/18/12 1340  BP: 100/67   Temp: 97.7 F (36.5 C)   Height:  4' 11.5" (1.511 m)  Weight: 144 lb (65.318 kg)     HISTORY: OB History   Grav Para Term Preterm Abortions TAB SAB Ect Mult Living   3 2 2       2      # Outc Date GA Lbr Len/2nd Wgt Sex Del Anes PTL Lv   1 TRM 7/05 [redacted]w[redacted]d  5lb6oz(2.438kg) F SVD None     2 TRM 6/08 [redacted]w[redacted]d  6lb5oz(2.863kg) F SVD None     3 CUR              Past Medical History  Diagnosis Date  . Abscess and cellulitis   . Asthma   . Tobacco use disorder   . UTI (lower urinary tract infection)   . Heart murmur    History reviewed. No pertinent past surgical history. Family History  Problem Relation Age of Onset  . Cancer Maternal Aunt 19    breast  . Cancer Paternal Aunt 76    breast  . Diabetes Paternal Grandmother   . Heart disease Paternal Grandmother      Exam    Uterus:  Fundal Height: 15 cm  Pelvic Exam:    Perineum: No Hemorrhoids, Normal Perineum   Vulva: normal   Vagina:  normal mucosa, normal discharge   Cervix: normal   Adnexa: normal adnexa and no mass, fullness, tenderness   Bony Pelvis: average  System: Breast:  normal appearance, no masses or tenderness   Skin: normal coloration and turgor, no rashes    Neurologic: negative   Extremities: normal strength, tone, and muscle mass   HEENT PERRLA   Mouth/Teeth mucous membranes moist, pharynx normal without lesions and dental hygiene good   Neck supple and no masses   Cardiovascular: regular rate and rhythm   Respiratory:  appears well, vitals normal, no respiratory distress,  acyanotic, normal RR   Abdomen: soft, non-tender; bowel sounds normal; no masses,  no organomegaly   Urinary: urethral meatus normal      Assessment:    Pregnancy: [id number] Patient Active Problem List  Diagnosis  . Discharge of breast  . Supervision of normal pregnancy      Plan:     Initial labs drawn.  Flu shot given. Flexeril given for back pain, recommended exercises, acetaminophen Continue Prenatal vitamins. Problem list reviewed and updated. Genetic Screening discussed Quad Screen: declined. Ultrasound discussed; fetal survey: ordered. Follow up in 4 weeks.   Milisa Kimbell A 09/19/2012

## 2012-09-19 LAB — OBSTETRIC PANEL
Antibody Screen: POSITIVE — AB
Basophils Absolute: 0 10*3/uL (ref 0.0–0.1)
Basophils Relative: 0 % (ref 0–1)
Hepatitis B Surface Ag: NEGATIVE
MCHC: 35.2 g/dL (ref 30.0–36.0)
Neutro Abs: 6.5 10*3/uL (ref 1.7–7.7)
Neutrophils Relative %: 76 % (ref 43–77)
Platelets: 242 10*3/uL (ref 150–400)
RDW: 13.1 % (ref 11.5–15.5)
WBC: 8.5 10*3/uL (ref 4.0–10.5)

## 2012-09-19 LAB — GC/CHLAMYDIA PROBE AMP, URINE
Chlamydia, Swab/Urine, PCR: NEGATIVE
GC Probe Amp, Urine: NEGATIVE

## 2012-09-19 LAB — HIV ANTIBODY (ROUTINE TESTING W REFLEX): HIV: NONREACTIVE

## 2012-09-20 LAB — CULTURE, OB URINE

## 2012-10-11 ENCOUNTER — Ambulatory Visit (HOSPITAL_COMMUNITY)
Admission: RE | Admit: 2012-10-11 | Discharge: 2012-10-11 | Disposition: A | Discharge status: Home / Self Care | Payer: Medicaid Other | Source: Ambulatory Visit | Attending: Obstetrics & Gynecology | Admitting: Obstetrics & Gynecology

## 2012-10-11 DIAGNOSIS — Z3689 Encounter for other specified antenatal screening: Secondary | ICD-10-CM | POA: Insufficient documentation

## 2012-10-11 DIAGNOSIS — Z3490 Encounter for supervision of normal pregnancy, unspecified, unspecified trimester: Secondary | ICD-10-CM

## 2012-10-14 ENCOUNTER — Encounter: Payer: Self-pay | Admitting: Obstetrics & Gynecology

## 2012-10-14 ENCOUNTER — Ambulatory Visit (HOSPITAL_COMMUNITY): Payer: Self-pay

## 2012-10-14 ENCOUNTER — Other Ambulatory Visit: Payer: Self-pay | Admitting: Obstetrics & Gynecology

## 2012-10-14 DIAGNOSIS — O358XX0 Maternal care for other (suspected) fetal abnormality and damage, not applicable or unspecified: Secondary | ICD-10-CM | POA: Insufficient documentation

## 2012-10-14 DIAGNOSIS — O358XX1 Maternal care for other (suspected) fetal abnormality and damage, fetus 1: Secondary | ICD-10-CM

## 2012-10-14 NOTE — Progress Notes (Signed)
Called Eileen Tate and informed her of echogenic bowel [place] results and need for followup- gave her 10/30/12 appt when she will be 21 wk.  Patient states she thought she would be 21 weeks this week based on a visit with [name]- informed her I had down that she is 18wk 5 day today- but that I would send that question to Dr. Macon Large via computer and if there are any changes we would call her and that she can disuss it with [name] at her next ob visit this Wednesday , 10/16/12. Patient voices understanding

## 2012-10-16 ENCOUNTER — Ambulatory Visit (INDEPENDENT_AMBULATORY_CARE_PROVIDER_SITE_OTHER): Payer: Medicaid Other | Admitting: Family Medicine

## 2012-10-16 VITALS — BP 116/76 | Temp 97.8°F | Wt 152.0 lb

## 2012-10-16 DIAGNOSIS — O358XX0 Maternal care for other (suspected) fetal abnormality and damage, not applicable or unspecified: Secondary | ICD-10-CM

## 2012-10-16 LAB — POCT URINALYSIS DIP (DEVICE)
Glucose, UA: NEGATIVE mg/dL
Hgb urine dipstick: NEGATIVE
Specific Gravity, Urine: 1.02 (ref 1.005–1.030)
Urobilinogen, UA: 0.2 mg/dL (ref 0.0–1.0)

## 2012-10-16 NOTE — Patient Instructions (Addendum)
Pregnancy - Second Trimester The second trimester of pregnancy (3 to 6 months) is a period of rapid growth for you and your baby. At the end of the sixth month, your baby is about 9 inches long and weighs 1 1/2 pounds. You will begin to feel the baby move between 18 and 20 weeks of the pregnancy. This is called quickening. Weight gain is faster. A clear fluid (colostrum) may leak out of your breasts. You may feel small contractions of the womb (uterus). This is known as false labor or Braxton-Hicks contractions. This is like a practice for labor when the baby is ready to be born. Usually, the problems with morning sickness have usually passed by the end of your first trimester. Some women develop small dark blotches (called cholasma, mask of pregnancy) on their face that usually goes away after the baby is born. Exposure to the sun makes the blotches worse. Acne may also develop in some pregnant women and pregnant women who have acne, may find that it goes away. PRENATAL EXAMS  Blood work may continue to be done during prenatal exams. These tests are done to check on your health and the probable health of your baby. Blood work is used to follow your blood levels (hemoglobin). Anemia (low hemoglobin) is common during pregnancy. Iron and vitamins are given to help prevent this. You will also be checked for diabetes between 24 and 28 weeks of the pregnancy. Some of the previous blood tests may be repeated.  The size of the uterus is measured during each visit. This is to make sure that the baby is continuing to grow properly according to the dates of the pregnancy.  Your blood pressure is checked every prenatal visit. This is to make sure you are not getting toxemia.  Your urine is checked to make sure you do not have an infection, diabetes or protein in the urine.  Your weight is checked often to make sure gains are happening at the suggested rate. This is to ensure that both you and your baby are growing  normally.  Sometimes, an ultrasound is performed to confirm the proper growth and development of the baby. This is a test which bounces harmless sound waves off the baby so your caregiver can more accurately determine due dates. Sometimes, a specialized test is done on the amniotic fluid surrounding the baby. This test is called an amniocentesis. The amniotic fluid is obtained by sticking a needle into the belly (abdomen). This is done to check the chromosomes in instances where there is a concern about possible genetic problems with the baby. It is also sometimes done near the end of pregnancy if an early delivery is required. In this case, it is done to help make sure the baby's lungs are mature enough for the baby to live outside of the womb. CHANGES OCCURING IN THE SECOND TRIMESTER OF PREGNANCY Your body goes through many changes during pregnancy. They vary from person to person. Talk to your caregiver about changes you notice that you are concerned about.  During the second trimester, you will likely have an increase in your appetite. It is normal to have cravings for certain foods. This varies from person to person and pregnancy to pregnancy.  Your lower abdomen will begin to bulge.  You may have to urinate more often because the uterus and baby are pressing on your bladder. It is also common to get more bladder infections during pregnancy (pain with urination). You can help this by   drinking lots of fluids and emptying your bladder before and after intercourse.  You may begin to get stretch marks on your hips, abdomen, and breasts. These are normal changes in the body during pregnancy. There are no exercises or medications to take that prevent this change.  You may begin to develop swollen and bulging veins (varicose veins) in your legs. Wearing support hose, elevating your feet for 15 minutes, 3 to 4 times a day and limiting salt in your diet helps lessen the problem.  Heartburn may develop  as the uterus grows and pushes up against the stomach. Antacids recommended by your caregiver helps with this problem. Also, eating smaller meals 4 to 5 times a day helps.  Constipation can be treated with a stool softener or adding bulk to your diet. Drinking lots of fluids, vegetables, fruits, and whole grains are helpful.  Exercising is also helpful. If you have been very active up until your pregnancy, most of these activities can be continued during your pregnancy. If you have been less active, it is helpful to start an exercise program such as walking.  Hemorrhoids (varicose veins in the rectum) may develop at the end of the second trimester. Warm sitz baths and hemorrhoid cream recommended by your caregiver helps hemorrhoid problems.  Backaches may develop during this time of your pregnancy. Avoid heavy lifting, wear low heal shoes and practice good posture to help with backache problems.  Some pregnant women develop tingling and numbness of their hand and fingers because of swelling and tightening of ligaments in the wrist (carpel tunnel syndrome). This goes away after the baby is born.  As your breasts enlarge, you may have to get a bigger bra. Get a comfortable, cotton, support bra. Do not get a nursing bra until the last month of the pregnancy if you will be nursing the baby.  You may get a dark line from your belly button to the pubic area called the linea nigra.  You may develop rosy cheeks because of increase blood flow to the face.  You may develop spider looking lines of the face, neck, arms and chest. These go away after the baby is born. HOME CARE INSTRUCTIONS   It is extremely important to avoid all smoking, herbs, alcohol, and unprescribed drugs during your pregnancy. These chemicals affect the formation and growth of the baby. Avoid these chemicals throughout the pregnancy to ensure the delivery of a healthy infant.  Most of your home care instructions are the same as  suggested for the first trimester of your pregnancy. Keep your caregiver's appointments. Follow your caregiver's instructions regarding medication use, exercise and diet.  During pregnancy, you are providing food for you and your baby. Continue to eat regular, well-balanced meals. Choose foods such as meat, fish, milk and other low fat dairy products, vegetables, fruits, and whole-grain breads and cereals. Your caregiver will tell you of the ideal weight gain.  A physical sexual relationship may be continued up until near the end of pregnancy if there are no other problems. Problems could include early (premature) leaking of amniotic fluid from the membranes, vaginal bleeding, abdominal pain, or other medical or pregnancy problems.  Exercise regularly if there are no restrictions. Check with your caregiver if you are unsure of the safety of some of your exercises. The greatest weight gain will occur in the last 2 trimesters of pregnancy. Exercise will help you:  Control your weight.  Get you in shape for labor and delivery.  Lose weight   after you have the baby.  Wear a good support or jogging bra for breast tenderness during pregnancy. This may help if worn during sleep. Pads or tissues may be used in the bra if you are leaking colostrum.  Do not use hot tubs, steam rooms or saunas throughout the pregnancy.  Wear your seat belt at all times when driving. This protects you and your baby if you are in an accident.  Avoid raw meat, uncooked cheese, cat litter boxes and soil used by cats. These carry germs that can cause birth defects in the baby.  The second trimester is also a good time to visit your dentist for your dental health if this has not been done yet. Getting your teeth cleaned is OK. Use a soft toothbrush. Brush gently during pregnancy.  It is easier to loose urine during pregnancy. Tightening up and strengthening the pelvic muscles will help with this problem. Practice stopping your  urination while you are going to the bathroom. These are the same muscles you need to strengthen. It is also the muscles you would use as if you were trying to stop from passing gas. You can practice tightening these muscles up 10 times a set and repeating this about 3 times per day. Once you know what muscles to tighten up, do not perform these exercises during urination. It is more likely to contribute to an infection by backing up the urine.  Ask for help if you have financial, counseling or nutritional needs during pregnancy. Your caregiver will be able to offer counseling for these needs as well as refer you for other special needs.  Your skin may become oily. If so, wash your face with mild soap, use non-greasy moisturizer and oil or cream based makeup. MEDICATIONS AND DRUG USE IN PREGNANCY  Take prenatal vitamins as directed. The vitamin should contain 1 milligram of folic acid. Keep all vitamins out of reach of children. Only a couple vitamins or tablets containing iron may be fatal to a baby or young child when ingested.  Avoid use of all medications, including herbs, over-the-counter medications, not prescribed or suggested by your caregiver. Only take over-the-counter or prescription medicines for pain, discomfort, or fever as directed by your caregiver. Do not use aspirin.  Let your caregiver also know about herbs you may be using.  Alcohol is related to a number of birth defects. This includes fetal alcohol syndrome. All alcohol, in any form, should be avoided completely. Smoking will cause low birth rate and premature babies.  Street or illegal drugs are very harmful to the baby. They are absolutely forbidden. A baby born to an addicted mother will be addicted at birth. The baby will go through the same withdrawal an adult does. SEEK MEDICAL CARE IF:  You have any concerns or worries during your pregnancy. It is better to call with your questions if you feel they cannot wait, rather  than worry about them. SEEK IMMEDIATE MEDICAL CARE IF:   An unexplained oral temperature above 102 F (38.9 C) develops, or as your caregiver suggests.  You have leaking of fluid from the vagina (birth canal). If leaking membranes are suspected, take your temperature and tell your caregiver of this when you call.  There is vaginal spotting, bleeding, or passing clots. Tell your caregiver of the amount and how many pads are used. Light spotting in pregnancy is common, especially following intercourse.  You develop a bad smelling vaginal discharge with a change in the color from clear   to white.  You continue to feel sick to your stomach (nauseated) and have no relief from remedies suggested. You vomit blood or coffee ground-like materials.  You lose more than 2 pounds of weight or gain more than 2 pounds of weight over 1 week, or as suggested by your caregiver.  You notice swelling of your face, hands, feet, or legs.  You get exposed to German measles and have never had them.  You are exposed to fifth disease or chickenpox.  You develop belly (abdominal) pain. Round ligament discomfort is a common non-cancerous (benign) cause of abdominal pain in pregnancy. Your caregiver still must evaluate you.  You develop a bad headache that does not go away.  You develop fever, diarrhea, pain with urination, or shortness of breath.  You develop visual problems, blurry, or double vision.  You fall or are in a car accident or any kind of trauma.  There is mental or physical violence at home. Document Released: 07/11/2001 Document Revised: 10/09/2011 Document Reviewed: 01/13/2009 Exit[employer] Patient Information 2013 [employer].  AFP Maternal This is a routine screen (tests) used to check for fetal abnormalities such as Down syndrome and neural tube defects. Down Syndrome is a chromosomal abnormality, sometimes called Trisomy 21. Neural tube defects are serious birth defects. The brain, spinal  cord, or their coverings do not develop completely. Women should be tested in the 15th to 20th week of pregnancy. The msAFP screen involves three or four tests that measure substances found in the blood that make the testing better. During development, AFP levels in fetal blood and amniotic fluid rise until about 12 weeks. The levels then gradually fall until birth. AFP is a protein produce by fetal tissue. AFP crosses the placenta and appears in the maternal blood. A baby with an open neural tube defect has an opening in its spine, head, or abdominal wall that allows higher-than-usual amounts of AFP to pass into the mother's blood. If a screen is positive, more tests are needed to make a diagnosis. These include ultrasound and perhaps amniocentesis (checking the fluid that surrounds the baby). These tests are used to help women and their caregivers make decisions about the management of their pregnancies. In pregnancies where the fetus is carrying the chromosomal defect that results in Down syndrome, the levels of AFP and unconjugated estriol tend to be low and hCG and inhibin A levels high.  PREPARATION FOR TEST Blood is drawn from a vein in your arm usually between the 15th and 20th weeks of pregnancy. Four different tests on your blood are done. These are AFP, hCG, unconjugated estriol, and inhibin A. The combination of tests produces a more accurate result. NORMAL FINDINGS   Adult: less than 40ng/mL or less than 40 mg/L (SI units)  Child younger than1 year: less than 30 ng/mL Ranges are stratified by weeks of gestation and vary among laboratories. Ranges for normal findings may vary among different laboratories and hospitals. You should always check with your doctor after having lab work or other tests done to discuss the meaning of your test results and whether your values are considered within normal limits. MEANING OF TEST  These are screening tests. Not all fetal abnormalities will give  positive test results. Of all women who have positive AFP screening results, only a very small number of them have babies who actually have a neural tube defect or chromosomal abnormality. Your caregiver will go over the test results with you and discuss the importance and meaning of   your results, as well as treatment options and the need for additional tests if necessary. OBTAINING THE TEST RESULTS It is your responsibility to obtain your test results. Ask the lab or department performing the test when and how you will get your results. Document Released: 08/08/2004 Document Revised: 10/09/2011 Document Reviewed: 06/20/2008 [employer] Patient Information 2013 [employer].   

## 2012-10-16 NOTE — Progress Notes (Signed)
Pulse- 90  Edema-hands  Pressure-lower abd

## 2012-10-16 NOTE — Progress Notes (Signed)
R rib pain off and on. Discussed echogenic bowel on ultrasound. Patient would like to get Quad screen - ordered. Has repeat ultrasound on 10/30/12. Declined genetic counseling for now. No other concerns.

## 2012-10-23 ENCOUNTER — Encounter: Payer: Self-pay | Admitting: *Deleted

## 2012-10-23 ENCOUNTER — Encounter: Payer: Self-pay | Admitting: Family Medicine

## 2012-10-30 ENCOUNTER — Ambulatory Visit (HOSPITAL_COMMUNITY)
Admission: RE | Admit: 2012-10-30 | Discharge: 2012-10-30 | Disposition: A | Discharge status: Home / Self Care | Payer: Medicaid Other | Source: Ambulatory Visit | Attending: Obstetrics & Gynecology | Admitting: Obstetrics & Gynecology

## 2012-10-30 ENCOUNTER — Encounter: Payer: Self-pay | Admitting: Obstetrics & Gynecology

## 2012-10-30 DIAGNOSIS — Z3689 Encounter for other specified antenatal screening: Secondary | ICD-10-CM | POA: Insufficient documentation

## 2012-10-30 DIAGNOSIS — Z8751 Personal history of pre-term labor: Secondary | ICD-10-CM | POA: Insufficient documentation

## 2012-10-30 DIAGNOSIS — O358XX0 Maternal care for other (suspected) fetal abnormality and damage, not applicable or unspecified: Secondary | ICD-10-CM | POA: Insufficient documentation

## 2012-10-30 DIAGNOSIS — O358XX1 Maternal care for other (suspected) fetal abnormality and damage, fetus 1: Secondary | ICD-10-CM

## 2012-11-04 ENCOUNTER — Inpatient Hospital Stay (HOSPITAL_COMMUNITY)
Admission: AD | Admit: 2012-11-04 | Discharge: 2012-11-05 | Disposition: A | Discharge status: Home / Self Care | Payer: Medicaid Other | Source: Ambulatory Visit | Attending: Obstetrics & Gynecology | Admitting: Obstetrics & Gynecology

## 2012-11-04 DIAGNOSIS — O9A212 Injury, poisoning and certain other consequences of external causes complicating pregnancy, second trimester: Secondary | ICD-10-CM

## 2012-11-04 DIAGNOSIS — O99891 Other specified diseases and conditions complicating pregnancy: Secondary | ICD-10-CM | POA: Insufficient documentation

## 2012-11-04 DIAGNOSIS — Y92410 Unspecified street and highway as the place of occurrence of the external cause: Secondary | ICD-10-CM | POA: Insufficient documentation

## 2012-11-04 DIAGNOSIS — N949 Unspecified condition associated with female genital organs and menstrual cycle: Secondary | ICD-10-CM | POA: Insufficient documentation

## 2012-11-04 DIAGNOSIS — N760 Acute vaginitis: Secondary | ICD-10-CM

## 2012-11-05 ENCOUNTER — Other Ambulatory Visit: Payer: Self-pay | Admitting: Family Medicine

## 2012-11-05 ENCOUNTER — Encounter (HOSPITAL_COMMUNITY): Payer: Self-pay | Admitting: *Deleted

## 2012-11-05 DIAGNOSIS — N760 Acute vaginitis: Secondary | ICD-10-CM

## 2012-11-05 DIAGNOSIS — A499 Bacterial infection, unspecified: Secondary | ICD-10-CM

## 2012-11-05 LAB — WET PREP, GENITAL: Trich, Wet Prep: NONE SEEN

## 2012-11-05 LAB — KLEIHAUER-BETKE STAIN: Quantitation Fetal Hemoglobin: 10 mL

## 2012-11-05 MED ORDER — OXYCODONE-ACETAMINOPHEN 5-325 MG PO TABS
2.0000 | ORAL_TABLET | Freq: Once | ORAL | Status: AC
  Administered 2012-11-05: 2 via ORAL
  Filled 2012-11-05: qty 2

## 2012-11-05 MED ORDER — METRONIDAZOLE 500 MG PO TABS: 500.0000 mg | ORAL_TABLET | Freq: Two times a day (BID) | ORAL | Status: AC
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
## 2012-11-05 NOTE — MAU Note (Signed)
Dr. Ferry at the bedside. 

## 2012-11-06 ENCOUNTER — Ambulatory Visit: Payer: Self-pay

## 2012-11-06 ENCOUNTER — Encounter: Payer: Self-pay | Admitting: *Deleted

## 2012-11-06 ENCOUNTER — Telehealth: Payer: Self-pay | Admitting: *Deleted

## 2012-11-06 DIAGNOSIS — Z3490 Encounter for supervision of normal pregnancy, unspecified, unspecified trimester: Secondary | ICD-10-CM

## 2012-11-06 NOTE — Telephone Encounter (Signed)
Attempted to call patient because she did not show for her Rhogam today.  Her phone number is not working. I also called the work number we have on file but she didn't work in that office.  Will send a certified letter asking patient to call us asap.

## 2012-11-08 ENCOUNTER — Telehealth (HOSPITAL_COMMUNITY): Payer: Self-pay | Admitting: *Deleted

## 2012-11-08 ENCOUNTER — Inpatient Hospital Stay (HOSPITAL_COMMUNITY)
Admission: AD | Admit: 2012-11-08 | Discharge: 2012-11-08 | Disposition: A | Discharge status: Home / Self Care | Payer: Medicaid Other | Source: Ambulatory Visit | Attending: Obstetrics and Gynecology | Admitting: Obstetrics and Gynecology

## 2012-11-08 DIAGNOSIS — Z2989 Encounter for other specified prophylactic measures: Secondary | ICD-10-CM | POA: Insufficient documentation

## 2012-11-08 DIAGNOSIS — Z298 Encounter for other specified prophylactic measures: Secondary | ICD-10-CM | POA: Insufficient documentation

## 2012-11-08 MED ORDER — RHO D IMMUNE GLOBULIN 1500 UNIT/2ML IJ SOLN
300.0000 ug | Freq: Once | INTRAMUSCULAR | Status: AC
  Administered 2012-11-08: 300 ug via INTRAMUSCULAR
  Filled 2012-11-08: qty 2

## 2012-11-08 NOTE — MAU Provider Note (Signed)
Attestation of Attending Supervision of Advanced Practitioner (CNM/NP): Evaluation and management procedures were performed by the Advanced Practitioner under my supervision and collaboration.  I have reviewed the Advanced Practitioner's note and chart, and I agree with the management and plan.  [name], [name] 1:32 PM

## 2012-11-08 NOTE — MAU Note (Signed)
Rhophylac information booklet given to the patient in the lobby. Explained the process of 1 1/2 hours for this order. Instructed the patient to wait in the lobby. Patient verbalized understanding of instructions.

## 2012-11-09 LAB — RH IG WORKUP (INCLUDES ABO/RH)
ABO/RH(D): A NEG
Unit division: 0

## 2012-11-13 ENCOUNTER — Other Ambulatory Visit: Payer: Self-pay | Admitting: Family Medicine

## 2012-11-13 ENCOUNTER — Ambulatory Visit (INDEPENDENT_AMBULATORY_CARE_PROVIDER_SITE_OTHER): Payer: Medicaid Other | Admitting: Advanced Practice Midwife

## 2012-11-13 VITALS — BP 121/77 | Wt 157.5 lb

## 2012-11-13 DIAGNOSIS — Z3480 Encounter for supervision of other normal pregnancy, unspecified trimester: Secondary | ICD-10-CM

## 2012-11-13 DIAGNOSIS — Z3492 Encounter for supervision of normal pregnancy, unspecified, second trimester: Secondary | ICD-10-CM

## 2012-11-13 LAB — POCT URINALYSIS DIP (DEVICE)
Bilirubin Urine: NEGATIVE
Hgb urine dipstick: NEGATIVE
Ketones, ur: NEGATIVE mg/dL
Protein, ur: NEGATIVE mg/dL
Specific Gravity, Urine: 1.02 (ref 1.005–1.030)
pH: 7 (ref 5.0–8.0)

## 2012-11-13 NOTE — Progress Notes (Signed)
Pulse- 98 

## 2012-11-13 NOTE — Patient Instructions (Signed)
Pregnancy - Second Trimester The second trimester of pregnancy (3 to 6 months) is a period of rapid growth for you and your baby. At the end of the sixth month, your baby is about 9 inches long and weighs 1 1/2 pounds. You will begin to feel the baby move between 18 and 20 weeks of the pregnancy. This is called quickening. Weight gain is faster. A clear fluid (colostrum) may leak out of your breasts. You may feel small contractions of the womb (uterus). This is known as false labor or Braxton-Hicks contractions. This is like a practice for labor when the baby is ready to be born. Usually, the problems with morning sickness have usually passed by the end of your first trimester. Some women develop small dark blotches (called cholasma, mask of pregnancy) on their face that usually goes away after the baby is born. Exposure to the sun makes the blotches worse. Acne may also develop in some pregnant women and pregnant women who have acne, may find that it goes away. PRENATAL EXAMS  Blood work may continue to be done during prenatal exams. These tests are done to check on your health and the probable health of your baby. Blood work is used to follow your blood levels (hemoglobin). Anemia (low hemoglobin) is common during pregnancy. Iron and vitamins are given to help prevent this. You will also be checked for diabetes between 24 and 28 weeks of the pregnancy. Some of the previous blood tests may be repeated.  The size of the uterus is measured during each visit. This is to make sure that the baby is continuing to grow properly according to the dates of the pregnancy.  Your blood pressure is checked every prenatal visit. This is to make sure you are not getting toxemia.  Your urine is checked to make sure you do not have an infection, diabetes or protein in the urine.  Your weight is checked often to make sure gains are happening at the suggested rate. This is to ensure that both you and your baby are growing  normally.  Sometimes, an ultrasound is performed to confirm the proper growth and development of the baby. This is a test which bounces harmless sound waves off the baby so your caregiver can more accurately determine due dates. Sometimes, a specialized test is done on the amniotic fluid surrounding the baby. This test is called an amniocentesis. The amniotic fluid is obtained by sticking a needle into the belly (abdomen). This is done to check the chromosomes in instances where there is a concern about possible genetic problems with the baby. It is also sometimes done near the end of pregnancy if an early delivery is required. In this case, it is done to help make sure the baby's lungs are mature enough for the baby to live outside of the womb. CHANGES OCCURING IN THE SECOND TRIMESTER OF PREGNANCY Your body goes through many changes during pregnancy. They vary from person to person. Talk to your caregiver about changes you notice that you are concerned about.  During the second trimester, you will likely have an increase in your appetite. It is normal to have cravings for certain foods. This varies from person to person and pregnancy to pregnancy.  Your lower abdomen will begin to bulge.  You may have to urinate more often because the uterus and baby are pressing on your bladder. It is also common to get more bladder infections during pregnancy (pain with urination). You can help this by   drinking lots of fluids and emptying your bladder before and after intercourse.  You may begin to get stretch marks on your hips, abdomen, and breasts. These are normal changes in the body during pregnancy. There are no exercises or medications to take that prevent this change.  You may begin to develop swollen and bulging veins (varicose veins) in your legs. Wearing support hose, elevating your feet for 15 minutes, 3 to 4 times a day and limiting salt in your diet helps lessen the problem.  Heartburn may develop  as the uterus grows and pushes up against the stomach. Antacids recommended by your caregiver helps with this problem. Also, eating smaller meals 4 to 5 times a day helps.  Constipation can be treated with a stool softener or adding bulk to your diet. Drinking lots of fluids, vegetables, fruits, and whole grains are helpful.  Exercising is also helpful. If you have been very active up until your pregnancy, most of these activities can be continued during your pregnancy. If you have been less active, it is helpful to start an exercise program such as walking.  Hemorrhoids (varicose veins in the rectum) may develop at the end of the second trimester. Warm sitz baths and hemorrhoid cream recommended by your caregiver helps hemorrhoid problems.  Backaches may develop during this time of your pregnancy. Avoid heavy lifting, wear low heal shoes and practice good posture to help with backache problems.  Some pregnant women develop tingling and numbness of their hand and fingers because of swelling and tightening of ligaments in the wrist (carpel tunnel syndrome). This goes away after the baby is born.  As your breasts enlarge, you may have to get a bigger bra. Get a comfortable, cotton, support bra. Do not get a nursing bra until the last month of the pregnancy if you will be nursing the baby.  You may get a dark line from your belly button to the pubic area called the linea nigra.  You may develop rosy cheeks because of increase blood flow to the face.  You may develop spider looking lines of the face, neck, arms and chest. These go away after the baby is born. HOME CARE INSTRUCTIONS   It is extremely important to avoid all smoking, herbs, alcohol, and unprescribed drugs during your pregnancy. These chemicals affect the formation and growth of the baby. Avoid these chemicals throughout the pregnancy to ensure the delivery of a healthy infant.  Most of your home care instructions are the same as  suggested for the first trimester of your pregnancy. Keep your caregiver's appointments. Follow your caregiver's instructions regarding medication use, exercise and diet.  During pregnancy, you are providing food for you and your baby. Continue to eat regular, well-balanced meals. Choose foods such as meat, fish, milk and other low fat dairy products, vegetables, fruits, and whole-grain breads and cereals. Your caregiver will tell you of the ideal weight gain.  A physical sexual relationship may be continued up until near the end of pregnancy if there are no other problems. Problems could include early (premature) leaking of amniotic fluid from the membranes, vaginal bleeding, abdominal pain, or other medical or pregnancy problems.  Exercise regularly if there are no restrictions. Check with your caregiver if you are unsure of the safety of some of your exercises. The greatest weight gain will occur in the last 2 trimesters of pregnancy. Exercise will help you:  Control your weight.  Get you in shape for labor and delivery.  Lose weight   after you have the baby.  Wear a good support or jogging bra for breast tenderness during pregnancy. This may help if worn during sleep. Pads or tissues may be used in the bra if you are leaking colostrum.  Do not use hot tubs, steam rooms or saunas throughout the pregnancy.  Wear your seat belt at all times when driving. This protects you and your baby if you are in an accident.  Avoid raw meat, uncooked cheese, cat litter boxes and soil used by cats. These carry germs that can cause birth defects in the baby.  The second trimester is also a good time to visit your dentist for your dental health if this has not been done yet. Getting your teeth cleaned is OK. Use a soft toothbrush. Brush gently during pregnancy.  It is easier to loose urine during pregnancy. Tightening up and strengthening the pelvic muscles will help with this problem. Practice stopping your  urination while you are going to the bathroom. These are the same muscles you need to strengthen. It is also the muscles you would use as if you were trying to stop from passing gas. You can practice tightening these muscles up 10 times a set and repeating this about 3 times per day. Once you know what muscles to tighten up, do not perform these exercises during urination. It is more likely to contribute to an infection by backing up the urine.  Ask for help if you have financial, counseling or nutritional needs during pregnancy. Your caregiver will be able to offer counseling for these needs as well as refer you for other special needs.  Your skin may become oily. If so, wash your face with mild soap, use non-greasy moisturizer and oil or cream based makeup. MEDICATIONS AND DRUG USE IN PREGNANCY  Take prenatal vitamins as directed. The vitamin should contain 1 milligram of folic acid. Keep all vitamins out of reach of children. Only a couple vitamins or tablets containing iron may be fatal to a baby or young child when ingested.  Avoid use of all medications, including herbs, over-the-counter medications, not prescribed or suggested by your caregiver. Only take over-the-counter or prescription medicines for pain, discomfort, or fever as directed by your caregiver. Do not use aspirin.  Let your caregiver also know about herbs you may be using.  Alcohol is related to a number of birth defects. This includes fetal alcohol syndrome. All alcohol, in any form, should be avoided completely. Smoking will cause low birth rate and premature babies.  Street or illegal drugs are very harmful to the baby. They are absolutely forbidden. A baby born to an addicted mother will be addicted at birth. The baby will go through the same withdrawal an adult does. SEEK MEDICAL CARE IF:  You have any concerns or worries during your pregnancy. It is better to call with your questions if you feel they cannot wait, rather  than worry about them. SEEK IMMEDIATE MEDICAL CARE IF:   An unexplained oral temperature above 102 F (38.9 C) develops, or as your caregiver suggests.  You have leaking of fluid from the vagina (birth canal). If leaking membranes are suspected, take your temperature and tell your caregiver of this when you call.  There is vaginal spotting, bleeding, or passing clots. Tell your caregiver of the amount and how many pads are used. Light spotting in pregnancy is common, especially following intercourse.  You develop a bad smelling vaginal discharge with a change in the color from clear   to white.  You continue to feel sick to your stomach (nauseated) and have no relief from remedies suggested. You vomit blood or coffee ground-like materials.  You lose more than 2 pounds of weight or gain more than 2 pounds of weight over 1 week, or as suggested by your caregiver.  You notice swelling of your face, hands, feet, or legs.  You get exposed to German measles and have never had them.  You are exposed to fifth disease or chickenpox.  You develop belly (abdominal) pain. Round ligament discomfort is a common non-cancerous (benign) cause of abdominal pain in pregnancy. Your caregiver still must evaluate you.  You develop a bad headache that does not go away.  You develop fever, diarrhea, pain with urination, or shortness of breath.  You develop visual problems, blurry, or double vision.  You fall or are in a car accident or any kind of trauma.  There is mental or physical violence at home. Document Released: 07/11/2001 Document Revised: 10/09/2011 Document Reviewed: 01/13/2009 [employer] Patient Information 2013 [employer].  
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
## 2012-11-26 ENCOUNTER — Telehealth: Payer: Self-pay | Admitting: *Deleted

## 2012-11-26 NOTE — Telephone Encounter (Addendum)
Pt left message stating that she has some questions. Please call back.  I returned her call and left message to call back to leave message of her questions and whether I can give her detailed answers and information on her voice mail.  4/30   1600- spoke w/pt and discussed her concerns. She states that she has been having nausea, vomiting and diarrhea x 1 Eternity Dexter. I advised pt that she may try Immodium AD for the diarrhea- take after every other loose stool to guard against constipation. Stay on liquid/soft diet- advance slowly as the sx subside. Pt states she is able to keep liquids down and is not dehydrated.  I also obtained Rx for phenergan and sent to pt's pharmacy. Pt voiced understanding of all information and advice given.

## 2012-11-27 ENCOUNTER — Telehealth: Payer: Self-pay | Admitting: General Practice

## 2012-11-27 MED ORDER — PROMETHAZINE HCL 25 MG PO TABS: 25.0000 mg | ORAL_TABLET | Freq: Four times a day (QID) | ORAL | Status: DC | PRN

## 2012-11-27 NOTE — Telephone Encounter (Signed)
Called patient, no answer- left message that we are returning your phone call and to please call us back.

## 2012-11-27 NOTE — Telephone Encounter (Signed)
I spoke with pt and discussed her concerns. She states that she has been having diarrhea with nausea and vomiting x2 days. The diarrhea is the biggest problem. She reports that she is able to keep liquids down and she drinks plenty of water. I advised that she may try Immodium if desired but not to take after every episode of diarrhea as this may cause constipation. I also offered Rx to be called in for nausea- pt agreed. I advised pt to stick with a soft, bland diet such as mashed potatoes, jello, soups or broth and applesauce- nothing greasy or spicy. Most likely this is either a virus or from something she ate. She may call back if she continues to have problems or go to MAU for evaluation if sx become more severe. Pt vocied understanding.

## 2012-11-27 NOTE — Telephone Encounter (Signed)
Called patient, no answer- left message that we are returning your phone call and to please call us back.  

## 2012-12-04 ENCOUNTER — Other Ambulatory Visit (HOSPITAL_COMMUNITY)
Admission: RE | Admit: 2012-12-04 | Discharge: 2012-12-04 | Disposition: A | Discharge status: Home / Self Care | Payer: Medicaid Other | Source: Ambulatory Visit | Attending: Family Medicine | Admitting: Family Medicine

## 2012-12-04 ENCOUNTER — Ambulatory Visit (INDEPENDENT_AMBULATORY_CARE_PROVIDER_SITE_OTHER): Payer: Medicaid Other | Admitting: Family Medicine

## 2012-12-04 VITALS — BP 131/87 | Temp 97.7°F | Wt 160.7 lb

## 2012-12-04 DIAGNOSIS — R6889 Other general symptoms and signs: Secondary | ICD-10-CM

## 2012-12-04 DIAGNOSIS — N871 Moderate cervical dysplasia: Secondary | ICD-10-CM | POA: Insufficient documentation

## 2012-12-04 DIAGNOSIS — N72 Inflammatory disease of cervix uteri: Secondary | ICD-10-CM | POA: Insufficient documentation

## 2012-12-04 DIAGNOSIS — Z3480 Encounter for supervision of other normal pregnancy, unspecified trimester: Secondary | ICD-10-CM

## 2012-12-04 DIAGNOSIS — IMO0002 Reserved for concepts with insufficient information to code with codable children: Secondary | ICD-10-CM | POA: Insufficient documentation

## 2012-12-04 DIAGNOSIS — R8281 Pyuria: Secondary | ICD-10-CM

## 2012-12-04 DIAGNOSIS — Z3490 Encounter for supervision of normal pregnancy, unspecified, unspecified trimester: Secondary | ICD-10-CM

## 2012-12-04 DIAGNOSIS — R82998 Other abnormal findings in urine: Secondary | ICD-10-CM

## 2012-12-04 LAB — POCT URINALYSIS DIP (DEVICE)
Ketones, ur: NEGATIVE mg/dL
Protein, ur: NEGATIVE mg/dL
Specific Gravity, Urine: 1.015 (ref 1.005–1.030)
Urobilinogen, UA: 0.2 mg/dL (ref 0.0–1.0)

## 2012-12-04 LAB — CBC
HCT: 32.3 % — ABNORMAL LOW (ref 36.0–46.0)
Hemoglobin: 11.4 g/dL — ABNORMAL LOW (ref 12.0–15.0)
MCH: 31.2 pg (ref 26.0–34.0)
MCHC: 35.3 g/dL (ref 30.0–36.0)
RBC: 3.65 MIL/uL — ABNORMAL LOW (ref 3.87–5.11)

## 2012-12-04 NOTE — Progress Notes (Signed)
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

## 2012-12-04 NOTE — Addendum Note (Signed)
Addended by: Napoleon Form on: 12/04/2012 11:43 AM   Modules accepted: Orders

## 2012-12-04 NOTE — Patient Instructions (Addendum)
Colposcopy Care After Colposcopy is a procedure in which a special tool is used to magnify the surface of the cervix. A tissue sample (biopsy) may also be taken. This sample will be looked at for cervical cancer or other problems. After the test:  You may have some cramping.  Lie down for a few minutes if you feel lightheaded.   You may have some bleeding which should stop in a few days. HOME CARE  Do not have sex or use tampons for 2 to 3 days or as told.  Only take medicine as told by your doctor.  Continue to take your birth control pills as usual. Finding out the results of your test Ask when your test results will be ready. Make sure you get your test results. GET HELP RIGHT AWAY IF:  You are bleeding a lot or are passing blood clots.  You develop a fever of 102 F (38.9 C) or higher.  You have abnormal vaginal discharge.  You have cramps that do not go away with medicine.  You feel lightheaded, dizzy, or pass out (faint). MAKE SURE YOU:   Understand these instructions.  Will watch your condition.  Will get help right away if you are not doing well or get worse. Document Released: 01/03/2008 Document Revised: 10/09/2011 Document Reviewed: 01/03/2008 [facility] Patient Information 2013 [place], [place].  Pregnancy - Third Trimester The third trimester of pregnancy (the last 3 months) is a period of the most rapid growth for you and your baby. The baby approaches a length of 20 inches and a weight of 6 to 10 pounds. The baby is adding on fat and getting ready for life outside your body. While inside, babies have periods of sleeping and waking, suck their thumbs, and hiccups. You can often feel small contractions of the uterus. This is false labor. It is also called Braxton-Hicks contractions. This is like a practice for labor. The usual problems in this stage of pregnancy include more difficulty breathing, swelling of the hands and feet from water retention, and having to  urinate more often because of the uterus and baby pressing on your bladder.  PRENATAL EXAMS  Blood work may continue to be done during prenatal exams. These tests are done to check on your health and the probable health of your baby. Blood work is used to follow your blood levels (hemoglobin). Anemia (low hemoglobin) is common during pregnancy. Iron and vitamins are given to help prevent this. You may also continue to be checked for diabetes. Some of the past blood tests may be done again.  The size of the uterus is measured during each visit. This makes sure your baby is growing properly according to your pregnancy dates.  Your blood pressure is checked every prenatal visit. This is to make sure you are not getting toxemia.  Your urine is checked every prenatal visit for infection, diabetes and protein.  Your weight is checked at each visit. This is done to make sure gains are happening at the suggested rate and that you and your baby are growing normally.  Sometimes, an ultrasound is performed to confirm the position and the proper growth and development of the baby. This is a test done that bounces harmless sound waves off the baby so your caregiver can more accurately determine due dates.  Discuss the type of pain medication and anesthesia you will have during your labor and delivery.  Discuss the possibility and anesthesia if a Cesarean Section might be necessary.  Inform  your caregiver if there is any mental or physical violence at home. Sometimes, a specialized non-stress test, contraction stress test and biophysical profile are done to make sure the baby is not having a problem. Checking the amniotic fluid surrounding the baby is called an amniocentesis. The amniotic fluid is removed by sticking a needle into the belly (abdomen). This is sometimes done near the end of pregnancy if an early delivery is required. In this case, it is done to help make sure the baby's lungs are mature enough  for the baby to live outside of the womb. If the lungs are not mature and it is unsafe to deliver the baby, an injection of cortisone medication is given to the mother 1 to 2 days before the delivery. This helps the baby's lungs mature and makes it safer to deliver the baby. CHANGES OCCURING IN THE THIRD TRIMESTER OF PREGNANCY Your body goes through many changes during pregnancy. They vary from person to person. Talk to your caregiver about changes you notice and are concerned about.  During the last trimester, you have probably had an increase in your appetite. It is normal to have cravings for certain foods. This varies from person to person and pregnancy to pregnancy.  You may begin to get stretch marks on your hips, abdomen, and breasts. These are normal changes in the body during pregnancy. There are no exercises or medications to take which prevent this change.  Constipation may be treated with a stool softener or adding bulk to your diet. Drinking lots of fluids, fiber in vegetables, fruits, and whole grains are helpful.  Exercising is also helpful. If you have been very active up until your pregnancy, most of these activities can be continued during your pregnancy. If you have been less active, it is helpful to start an exercise program such as walking. Consult your caregiver before starting exercise programs.  Avoid all smoking, alcohol, un-prescribed drugs, herbs and "street drugs" during your pregnancy. These chemicals affect the formation and growth of the baby. Avoid chemicals throughout the pregnancy to ensure the delivery of a healthy infant.  Backache, varicose veins and hemorrhoids may develop or get worse.  You will tire more easily in the third trimester, which is normal.  The baby's movements may be stronger and more often.  You may become short of breath easily.  Your belly button may stick out.  A yellow discharge may leak from your breasts called colostrum.  You may  have a bloody mucus discharge. This usually occurs a few days to a week before labor begins. HOME CARE INSTRUCTIONS   Keep your caregiver's appointments. Follow your caregiver's instructions regarding medication use, exercise, and diet.  During pregnancy, you are providing food for you and your baby. Continue to eat regular, well-balanced meals. Choose foods such as meat, fish, milk and other low fat dairy products, vegetables, fruits, and whole-grain breads and cereals. Your caregiver will tell you of the ideal weight gain.  A physical sexual relationship may be continued throughout pregnancy if there are no other problems such as early (premature) leaking of amniotic fluid from the membranes, vaginal bleeding, or belly (abdominal) pain.  Exercise regularly if there are no restrictions. Check with your caregiver if you are unsure of the safety of your exercises. Greater weight gain will occur in the last 2 trimesters of pregnancy. Exercising helps:  Control your weight.  Get you in shape for labor and delivery.  You lose weight after you deliver.  Rest  a lot with legs elevated, or as needed for leg cramps or low back pain.  Wear a good support or jogging bra for breast tenderness during pregnancy. This may help if worn during sleep. Pads or tissues may be used in the bra if you are leaking colostrum.  Do not use hot tubs, steam rooms, or saunas.  Wear your seat belt when driving. This protects you and your baby if you are in an accident.  Avoid raw meat, cat litter boxes and soil used by cats. These carry germs that can cause birth defects in the baby.  It is easier to loose urine during pregnancy. Tightening up and strengthening the pelvic muscles will help with this problem. You can practice stopping your urination while you are going to the bathroom. These are the same muscles you need to strengthen. It is also the muscles you would use if you were trying to stop from passing gas. You  can practice tightening these muscles up 10 times a set and repeating this about 3 times per day. Once you know what muscles to tighten up, do not perform these exercises during urination. It is more likely to cause an infection by backing up the urine.  Ask for help if you have financial, counseling or nutritional needs during pregnancy. Your caregiver will be able to offer counseling for these needs as well as refer you for other special needs.  Make a list of emergency phone numbers and have them available.  Plan on getting help from family or friends when you go home from the hospital.  Make a trial run to the hospital.  Take prenatal classes with the father to understand, practice and ask questions about the labor and delivery.  Prepare the baby's room/nursery.  Do not travel out of the city unless it is absolutely necessary and with the advice of your caregiver.  Wear only low or no heal shoes to have better balance and prevent falling. MEDICATIONS AND DRUG USE IN PREGNANCY  Take prenatal vitamins as directed. The vitamin should contain 1 milligram of folic acid. Keep all vitamins out of reach of children. Only a couple vitamins or tablets containing iron may be fatal to a baby or young child when ingested.  Avoid use of all medications, including herbs, over-the-counter medications, not prescribed or suggested by your caregiver. Only take over-the-counter or prescription medicines for pain, discomfort, or fever as directed by your caregiver. Do not use aspirin, ibuprofen (Motrin, Advil, Nuprin) or naproxen (Aleve) unless OK'd by your caregiver.  Let your caregiver also know about herbs you may be using.  Alcohol is related to a number of birth defects. This includes fetal alcohol syndrome. All alcohol, in any form, should be avoided completely. Smoking will cause low birth rate and premature babies.  Street/illegal drugs are very harmful to the baby. They are absolutely  forbidden. A baby born to an addicted mother will be addicted at birth. The baby will go through the same withdrawal an adult does. SEEK MEDICAL CARE IF: You have any concerns or worries during your pregnancy. It is better to call with your questions if you feel they cannot wait, rather than worry about them. DECISIONS ABOUT CIRCUMCISION You may or may not know the sex of your baby. If you know your baby is a boy, it may be time to think about circumcision. Circumcision is the removal of the foreskin of the penis. This is the skin that covers the sensitive end of the penis.  There is no proven medical need for this. Often this decision is made on what is popular at the time or based upon religious beliefs and social issues. You can discuss these issues with your caregiver or pediatrician. SEEK IMMEDIATE MEDICAL CARE IF:   An unexplained oral temperature above 102 F (38.9 C) develops, or as your caregiver suggests.  You have leaking of fluid from the vagina (birth canal). If leaking membranes are suspected, take your temperature and tell your caregiver of this when you call.  There is vaginal spotting, bleeding or passing clots. Tell your caregiver of the amount and how many pads are used.  You develop a bad smelling vaginal discharge with a change in the color from clear to white.  You develop vomiting that lasts more than 24 hours.  You develop chills or fever.  You develop shortness of breath.  You develop burning on urination.  You loose more than 2 pounds of weight or gain more than 2 pounds of weight or as suggested by your caregiver.  You notice sudden swelling of your face, hands, and feet or legs.  You develop belly (abdominal) pain. Round ligament discomfort is a common non-cancerous (benign) cause of abdominal pain in pregnancy. Your caregiver still must evaluate you.  You develop a severe headache that does not go away.  You develop visual problems, blurred or double  vision.  If you have not felt your baby move for more than 1 hour. If you think the baby is not moving as much as usual, eat something with sugar in it and lie down on your left side for an hour. The baby should move at least 4 to 5 times per hour. Call right away if your baby moves less than that.  You fall, are in a car accident or any kind of trauma.  There is mental or physical violence at home. Document Released: 07/11/2001 Document Revised: 10/09/2011 Document Reviewed: 01/13/2009 [facility] Patient Information 2013 [place], [place].

## 2012-12-04 NOTE — Addendum Note (Signed)
Addended by: Napoleon Form on: 12/04/2012 12:14 PM   Modules accepted: Orders

## 2012-12-04 NOTE — Progress Notes (Signed)
Pulse- 106  Edema-feet  Pain-"throbbing pain lower abd"  Vaginal discharge- clear COLPO

## 2012-12-04 NOTE — Procedures (Signed)
GYNECOLOGY CLINIC PROCEDURE NOTE  27 y.o. [id number] at [redacted]w[redacted]d here for colposcopy for low-grade squamous intraepithelial neoplasia (LGSIL - encompassing HPV,mild dysplasia,CIN I) pap smear on 07/05/12. Discussed role for HPV and smoking in cervical dysplasia, need for surveillance.  Patient given informed consent, signed copy in the chart, time out was performed.  Placed in lithotomy position. Cervix viewed with speculum and colposcope after application of acetic acid.   Colposcopy adequate? Yes  acetowhite lesion(s) noted at 11 and 5 o'clock; biopsies obtained.  ECC specimen NOT obtained due to pregnancy. All specimens were labelled and sent to pathology. Monsel's solution applied for hemostasis. 1000 mg tylenol given for cramping.  Patient was given post procedure instructions.  Will follow up pathology and manage accordingly.  Routine preventative health maintenance measures emphasized.  Napoleon Form, MD 12/04/2012 12:53 PM

## 2012-12-04 NOTE — Progress Notes (Addendum)
Tylenol, maternity support belt, stretches for pubic symphysis pain. Colposcopy done today. See procedure note.

## 2012-12-04 NOTE — Progress Notes (Signed)
Colpo

## 2012-12-05 LAB — HIV ANTIBODY (ROUTINE TESTING W REFLEX): HIV: NONREACTIVE

## 2012-12-06 ENCOUNTER — Telehealth: Payer: Self-pay | Admitting: *Deleted

## 2012-12-06 ENCOUNTER — Encounter: Payer: Self-pay | Admitting: Family Medicine

## 2012-12-06 ENCOUNTER — Inpatient Hospital Stay (HOSPITAL_COMMUNITY)
Admission: AD | Admit: 2012-12-06 | Discharge: 2012-12-06 | Disposition: A | Discharge status: Home / Self Care | Payer: Medicaid Other | Source: Ambulatory Visit | Attending: Obstetrics & Gynecology | Admitting: Obstetrics & Gynecology

## 2012-12-06 ENCOUNTER — Encounter (HOSPITAL_COMMUNITY): Payer: Self-pay

## 2012-12-06 DIAGNOSIS — N390 Urinary tract infection, site not specified: Secondary | ICD-10-CM | POA: Insufficient documentation

## 2012-12-06 DIAGNOSIS — N949 Unspecified condition associated with female genital organs and menstrual cycle: Secondary | ICD-10-CM

## 2012-12-06 DIAGNOSIS — R109 Unspecified abdominal pain: Secondary | ICD-10-CM | POA: Insufficient documentation

## 2012-12-06 DIAGNOSIS — O2343 Unspecified infection of urinary tract in pregnancy, third trimester: Secondary | ICD-10-CM

## 2012-12-06 DIAGNOSIS — O2390 Unspecified genitourinary tract infection in pregnancy, unspecified trimester: Secondary | ICD-10-CM | POA: Insufficient documentation

## 2012-12-06 LAB — URINALYSIS, ROUTINE W REFLEX MICROSCOPIC
Glucose, UA: NEGATIVE mg/dL
Nitrite: NEGATIVE
Specific Gravity, Urine: <1.005 — ABNORMAL LOW (ref 1.005–1.030)
pH: 6 (ref 5.0–8.0)

## 2012-12-06 LAB — WET PREP, GENITAL
Trich, Wet Prep: NONE SEEN
Yeast Wet Prep HPF POC: NONE SEEN

## 2012-12-06 LAB — URINE MICROSCOPIC-ADD ON

## 2012-12-06 MED ORDER — ACETAMINOPHEN-CODEINE #3 300-30 MG PO TABS
1.0000 | ORAL_TABLET | Freq: Once | ORAL | Status: AC
  Administered 2012-12-06: 1 via ORAL
  Filled 2012-12-06: qty 1

## 2012-12-06 MED ORDER — NITROFURANTOIN MONOHYD MACRO 100 MG PO CAPS: 100.0000 mg | ORAL_CAPSULE | Freq: Two times a day (BID) | ORAL | Status: DC

## 2012-12-06 NOTE — MAU Note (Signed)
Having a lot of pain and pressure in lower abd. Had biopsy of cervix Weds in clinic.

## 2012-12-06 NOTE — MAU Provider Note (Signed)
History     CSN: [id number]  Arrival date and time: 12/06/12 2103   First Provider Initiated Contact with Patient 12/06/12 2141      Chief Complaint  Patient presents with  . Abdominal Pain   HPI  Pt is a G3P2002 at 26.2 wks IUP here with report of right lower groin pain that started this afternoon.  Pain intensifies with walking and standing.  No report of vaginal bleeding.  Noticed a watery discharge that started this afternoon.  Noticed when wiping.  Pt had a colposcopy 12/04/12.    Past Medical History  Diagnosis Date  . Abscess and cellulitis   . Asthma   . Tobacco use disorder   . UTI (lower urinary tract infection)   . Heart murmur     Past Surgical History  Procedure Laterality Date  . No past surgeries      Family History  Problem Relation Age of Onset  . Cancer Maternal Aunt 19    breast  . Cancer Paternal Aunt 20    breast  . Diabetes Paternal Grandmother   . Heart disease Paternal Grandmother     History  Substance Use Topics  . Smoking status: Current Some Day Smoker -- 0.50 packs/day for 9 years    Types: Cigarettes  . Smokeless tobacco: Never Used  . Alcohol Use: No     Comment: occasion    Allergies:  Allergies  Allergen Reactions  . Ibuprofen Other (See Comments)    Causes blisters    Prescriptions prior to admission  Medication Sig Dispense Refill  . acetaminophen (TYLENOL) 325 MG tablet Take 650 mg by mouth every 6 (six) hours as needed for pain.      . cyclobenzaprine (FLEXERIL) 10 MG tablet Take 1 tablet (10 mg total) by mouth 3 (three) times daily as needed for muscle spasms.  30 tablet  2  . diphenhydrAMINE (BENADRYL) 25 MG tablet Take 25 mg by mouth every 6 (six) hours as needed for itching.      . Prenatal Vit-Fe Fumarate-FA (PRENATAL MULTIVITAMIN) TABS Take 1 tablet by mouth at bedtime.      . levalbuterol (XOPENEX HFA) 45 MCG/ACT inhaler Inhale 1 puff into the lungs daily as needed. For shortness of breath        Review of  Systems  Gastrointestinal: Positive for abdominal pain (groin pain).  Genitourinary: Negative.        Clear vaginal discharge  All other systems reviewed and are negative.   Physical Exam   Blood pressure 125/72, pulse 105, temperature 98.4 F (36.9 C), temperature source Oral, resp. rate 20, height 5' 0.5" (1.537 m), weight 74.662 kg (164 lb 9.6 oz), last menstrual period 05/31/2012, SpO2 98.00%.  Physical Exam  Constitutional: She is oriented to person, place, and time. She appears well-developed and well-nourished. No distress.  HENT:  Head: Normocephalic.  Neck: Normal range of motion. Neck supple.  Cardiovascular: Normal rate, regular rhythm and normal heart sounds.   Respiratory: Effort normal and breath sounds normal.  GI: Soft. There is no tenderness.  Genitourinary: Cervix exhibits no motion tenderness. No bleeding around the vagina. Vaginal discharge (mucusy) found.  Neurological: She is alert and oriented to person, place, and time.  Skin: Skin is warm and dry.    cervix - closed/long FHR 130's, +accels, reactive Toco - none MAU Course  Procedures  PO Tylenol #3 x 1  Results for orders placed during the hospital encounter of 12/06/12 (from the past 24 hour(s))  URINALYSIS, ROUTINE W REFLEX MICROSCOPIC     Status: Abnormal   Collection Time    12/06/12  9:30 PM      Result Value Range   Color, Urine YELLOW  YELLOW   APPearance CLEAR  CLEAR   Specific Gravity, Urine <1.005 (*) 1.005 - 1.030   pH 6.0  5.0 - 8.0   Glucose, UA NEGATIVE  NEGATIVE mg/dL   Hgb urine dipstick SMALL (*) NEGATIVE   Bilirubin Urine NEGATIVE  NEGATIVE   Ketones, ur NEGATIVE  NEGATIVE mg/dL   Protein, ur NEGATIVE  NEGATIVE mg/dL   Urobilinogen, UA 0.2  0.0 - 1.0 mg/dL   Nitrite NEGATIVE  NEGATIVE   Leukocytes, UA LARGE (*) NEGATIVE  URINE MICROSCOPIC-ADD ON     Status: None   Collection Time    12/06/12  9:30 PM      Result Value Range   Squamous Epithelial / LPF RARE  RARE   WBC, UA  3-6  <3 WBC/hpf   RBC / HPF 0-2  <3 RBC/hpf   Bacteria, UA RARE  RARE  WET PREP, GENITAL     Status: Abnormal   Collection Time    12/06/12  9:55 PM      Result Value Range   Yeast Wet Prep HPF POC NONE SEEN  NONE SEEN   Trich, Wet Prep NONE SEEN  NONE SEEN   Clue Cells Wet Prep HPF POC FEW (*) NONE SEEN   WBC, Wet Prep HPF POC MODERATE (*) NONE SEEN    Assessment and Plan  Round Ligament Pain UTI  Plan: DC to home RX Macrobid Urine Culture Keep follow-up appt  [facility] 12/06/2012, 9:43 PM

## 2012-12-06 NOTE — Telephone Encounter (Signed)
Pt left message requesting test results form visit on 5/7. I returned her call and informed her of blood test results RPR, HIV, CBC and 1hr GTT. She will need to have 3hr GTT- scheduled for 5/13 @ 0830. All other tests have not resulted (cx biopsy and urine culture).  Pt voiced understanding.

## 2012-12-09 ENCOUNTER — Telehealth: Payer: Self-pay | Admitting: Obstetrics and Gynecology

## 2012-12-09 NOTE — Telephone Encounter (Signed)
Please try to call patient again. Phone was disconnected. Needs 3 hr GTT set on 12/12/12 @ 0800.

## 2012-12-10 NOTE — Telephone Encounter (Addendum)
Called pt- heard message stating the number is incorrect- no other number on file. If pt does not come to scheduled appt on 5/15. Certified letter will be mailed.

## 2012-12-12 ENCOUNTER — Telehealth: Payer: Self-pay

## 2012-12-12 ENCOUNTER — Other Ambulatory Visit: Payer: Self-pay

## 2012-12-12 NOTE — Telephone Encounter (Signed)
Pt called and stated that she wanted test results from Dr. Thad Ranger. Called pt and informed pt that her colpo came back abnormal and that further management would be advised by the provider. Pt stated understanding and did not have any other questions.

## 2012-12-12 NOTE — MAU Provider Note (Signed)
Attestation of Attending Supervision of Advanced Practitioner (CNM/NP): Evaluation and management procedures were performed by the Advanced Practitioner under my supervision and collaboration. I have reviewed the Advanced Practitioner's note and chart, and I agree with the management and plan.  LEGGETT,KELLY H. 2:38 PM

## 2012-12-13 ENCOUNTER — Other Ambulatory Visit: Payer: Self-pay

## 2012-12-13 DIAGNOSIS — Z3490 Encounter for supervision of normal pregnancy, unspecified, unspecified trimester: Secondary | ICD-10-CM

## 2012-12-13 DIAGNOSIS — R7309 Other abnormal glucose: Secondary | ICD-10-CM

## 2012-12-13 DIAGNOSIS — M549 Dorsalgia, unspecified: Secondary | ICD-10-CM

## 2012-12-13 MED ORDER — CYCLOBENZAPRINE HCL 10 MG PO TABS: 10.0000 mg | ORAL_TABLET | Freq: Three times a day (TID) | ORAL | Status: DC | PRN

## 2012-12-14 LAB — GLUCOSE TOLERANCE, 3 HOURS: Glucose Tolerance, 2 hour: 116 mg/dL (ref 70–164)

## 2012-12-18 ENCOUNTER — Ambulatory Visit (INDEPENDENT_AMBULATORY_CARE_PROVIDER_SITE_OTHER): Payer: Medicaid Other | Admitting: Advanced Practice Midwife

## 2012-12-18 VITALS — BP 121/79 | Temp 97.1°F | Wt 163.8 lb

## 2012-12-18 DIAGNOSIS — R6889 Other general symptoms and signs: Secondary | ICD-10-CM

## 2012-12-18 DIAGNOSIS — IMO0002 Reserved for concepts with insufficient information to code with codable children: Secondary | ICD-10-CM

## 2012-12-18 DIAGNOSIS — Z3480 Encounter for supervision of other normal pregnancy, unspecified trimester: Secondary | ICD-10-CM

## 2012-12-18 LAB — POCT URINALYSIS DIP (DEVICE)
Hgb urine dipstick: NEGATIVE
Ketones, ur: NEGATIVE mg/dL
Protein, ur: NEGATIVE mg/dL
pH: 7 (ref 5.0–8.0)

## 2012-12-18 NOTE — Progress Notes (Signed)
Pulse- 98 Edema-feet/ankles  Pain/pressure- side Vomited x 1 on Sunday

## 2012-12-18 NOTE — Progress Notes (Signed)
Feels OK but some ligament pains   Discussed Colpo Results with Dr Debroah Loop and patient. Needs LEEP postpartum.

## 2012-12-18 NOTE — Patient Instructions (Signed)
Loop Electrosurgical Excision Procedure  Loop electrosurgical excision procedure (LEEP) is the removal of a portion of the lower part of the uterus (cervix). The procedure is done when there are significantly abnormal cervical cell changes. Abnormal cell changes of the cervix can lead to cancer if left in place and untreated.     The LEEP procedure itself typically only takes a few minutes. Often, it may be done in your caregiver's office. The procedure is considered safe for those who wish to get pregnant or are trying to get pregnant. Only under rare circumstances should this procedure be done if you are pregnant.  LET YOUR CAREGIVER KNOW ABOUT:  · Whether you are pregnant or late for your last menstrual period.  · Allergies to foods or medicines.  · All the medicines you are taking including herbs, eyedrops, and over-the-counter medicines, and creams.  · Use of steroids (by mouth or creams).  · Previous problems with anesthetics or numbing medicine.  · Previous gynecological surgery.  · History of blood clots or bleeding problems.  · Any recent or current vaginal infections (herpes, sexually transmitted infections).  · Other health problems.  RISKS AND COMPLICATIONS  · Bleeding.  · Infection.  · Injury to the vagina, bladder, or rectum.  · Very rare obstruction of the cervical opening that causes problems during menstruation (cervical stenosis).  BEFORE THE PROCEDURE  · Do not take aspirin or blood thinners (anticoagulants) for 1 week before the procedure, or as told by your caregiver.  · Eat a light meal before the procedure.  · Ask your caregiver about changing or stopping your regular medicines.  · You may be given a pain reliever 1 or 2 hours before the procedure.  PROCEDURE   · A tool (speculum) is placed in the vagina. This allows your caregiver to see the cervix.  · An iodine stain is applied to the cervix to find the area of abnormal cells to be removed.  · Medicine is injected to numb the cervix (local  anesthetic).    · Electricity is passed through a thin wire loop which is then used to remove (cauterize) a small segment of the affected cervix.  · Light electrocautery is used to seal any small blood vessels and prevent bleeding.  · A paste may be applied to the cauterized area of the cervix to help prevent bleeding.  · The tissue sample is sent to the lab. It is examined under the microscope.  AFTER THE PROCEDURE  · Have someone drive you home.  · You may have slight to moderate cramping.  · You may notice a black vaginal discharge from the paste used on the cervix to prevent bleeding. This is normal.  · Watch for excessive bleeding. This requires immediate medical care.  · Ask when your test results will be ready. Make sure you get your test results.  Document Released: 10/07/2002 Document Revised: 10/09/2011 Document Reviewed: 12/27/2010  [employer]® Patient Information ©2014 [employer].

## 2012-12-25 ENCOUNTER — Telehealth: Payer: Self-pay | Admitting: General Practice

## 2012-12-25 NOTE — Telephone Encounter (Signed)
Patient called back stating she was returning our phone call. Patient stated she had a UTI a couple weeks ago and took an antibiotic and her UTI went away but last night she started having a mucous d/c and occasional cramping, but not having contractions and just wanted to make sure everything was okay. Told patient that unless the d/c is bothering her she is probably okay but that the provider can look into it at her next prenatal visit. Told patient should she start having bright red bleeding, gushes of fluid or contractions to come to MAU. Patient verbalized understanding and had no further questions

## 2012-12-25 NOTE — Telephone Encounter (Signed)
Patient called and left message stating she saw Dr Thad Ranger recently and she has some questions about a concern of hers and would like a call back. Called patient at her cell number and her mother in law answered stating she wasn't with her at the moment but to call her at her home number. Called patient there, no answer- left message that we are returning her phone call

## 2013-01-01 ENCOUNTER — Ambulatory Visit (INDEPENDENT_AMBULATORY_CARE_PROVIDER_SITE_OTHER): Payer: Medicaid Other | Admitting: Family

## 2013-01-01 VITALS — BP 109/68 | Temp 97.8°F | Wt 162.0 lb

## 2013-01-01 DIAGNOSIS — O2390 Unspecified genitourinary tract infection in pregnancy, unspecified trimester: Secondary | ICD-10-CM

## 2013-01-01 DIAGNOSIS — R8271 Bacteriuria: Secondary | ICD-10-CM

## 2013-01-01 LAB — POCT URINALYSIS DIP (DEVICE)
Bilirubin Urine: NEGATIVE
Ketones, ur: NEGATIVE mg/dL
Nitrite: NEGATIVE
Specific Gravity, Urine: 1.02 (ref 1.005–1.030)
pH: 7 (ref 5.0–8.0)

## 2013-01-01 NOTE — Progress Notes (Signed)
Small leuks>urine culture sent, no syx; left calf pain that radiates down to foot; no heat or redness exam - neg homans, no sig swelling, no redness > non urgent venous doppler ordered.  Report to [facility] asap for severe calf pain, redness, or heat;  1 hr results reviewed.

## 2013-01-01 NOTE — Progress Notes (Signed)
Pulse 98  Edema +1 on ankles and feet. C/o Left Leg pain.

## 2013-01-06 ENCOUNTER — Ambulatory Visit (HOSPITAL_COMMUNITY)
Admission: RE | Admit: 2013-01-06 | Discharge: 2013-01-06 | Disposition: A | Discharge status: Home / Self Care | Payer: Medicaid Other | Source: Ambulatory Visit | Attending: Family | Admitting: Family

## 2013-01-06 ENCOUNTER — Ambulatory Visit (INDEPENDENT_AMBULATORY_CARE_PROVIDER_SITE_OTHER): Payer: Medicaid Other | Admitting: Obstetrics and Gynecology

## 2013-01-06 ENCOUNTER — Other Ambulatory Visit (HOSPITAL_COMMUNITY): Payer: Self-pay | Admitting: Family

## 2013-01-06 VITALS — BP 122/82 | Temp 97.2°F | Wt 162.3 lb

## 2013-01-06 DIAGNOSIS — M7989 Other specified soft tissue disorders: Secondary | ICD-10-CM | POA: Insufficient documentation

## 2013-01-06 DIAGNOSIS — Z3490 Encounter for supervision of normal pregnancy, unspecified, unspecified trimester: Secondary | ICD-10-CM

## 2013-01-06 DIAGNOSIS — IMO0002 Reserved for concepts with insufficient information to code with codable children: Secondary | ICD-10-CM

## 2013-01-06 DIAGNOSIS — M79609 Pain in unspecified limb: Secondary | ICD-10-CM | POA: Insufficient documentation

## 2013-01-06 DIAGNOSIS — M25562 Pain in left knee: Secondary | ICD-10-CM

## 2013-01-06 DIAGNOSIS — R8271 Bacteriuria: Secondary | ICD-10-CM

## 2013-01-06 DIAGNOSIS — O4290 Premature rupture of membranes, unspecified as to length of time between rupture and onset of labor, unspecified weeks of gestation: Secondary | ICD-10-CM

## 2013-01-06 DIAGNOSIS — R87612 Low grade squamous intraepithelial lesion on cytologic smear of cervix (LGSIL): Secondary | ICD-10-CM

## 2013-01-06 DIAGNOSIS — O2390 Unspecified genitourinary tract infection in pregnancy, unspecified trimester: Secondary | ICD-10-CM

## 2013-01-06 DIAGNOSIS — R6889 Other general symptoms and signs: Secondary | ICD-10-CM

## 2013-01-06 LAB — POCT URINALYSIS DIP (DEVICE)
Bilirubin Urine: NEGATIVE
Hgb urine dipstick: NEGATIVE
Ketones, ur: NEGATIVE mg/dL
Protein, ur: NEGATIVE mg/dL
Specific Gravity, Urine: 1.015 (ref 1.005–1.030)
pH: 7 (ref 5.0–8.0)

## 2013-01-06 NOTE — Progress Notes (Signed)
Pulse- 89 Pt informed me that she has been drinking several bottles and still feels like I'm dehydrated.  Pt states on Saturday she has a gush of fluid that was like brown called on call nurse and was told that if there is no blood then everything should be ok

## 2013-01-06 NOTE — Progress Notes (Signed)
Patient presenting today as walk-in visit for evaluation of pelvic pressure and cramping pain, as well as pelvic pain with ambulation. She also described an episode of Saturday where her pants where soaked along with her lower back with brown-tinged fluid. Patient reports further episodes of leakage since but not to that extent. Informed patient that Eileen Tate contractions are common in the third trimester. SSE: thin white discharge, no blood, negative pooling. Negative nitrazine. Wet prep collected. Cervix: closed/thick/posterior/ ballotable head. Wet prep collected AFI by Diane obtained which was 12. Patient advised to present to MAU if further episodes of vaginal bleeding occur to rule out rupture of membrane FM/PTL precautions reviewed. Advised the use of maternity support belt to help with pelvic pressure and pain with ambulation. RTC as scheduled

## 2013-01-07 ENCOUNTER — Telehealth: Payer: Self-pay | Admitting: *Deleted

## 2013-01-07 LAB — WET PREP, GENITAL

## 2013-01-07 MED ORDER — METRONIDAZOLE 500 MG PO TABS: 500.0000 mg | ORAL_TABLET | Freq: Two times a day (BID) | ORAL | Status: DC

## 2013-01-07 NOTE — Telephone Encounter (Addendum)
Message copied by Jill Side on Tue Jan 07, 2013  3:41 PM ------      Message from: CONSTANT, Gigi Gin      Created: Tue Jan 07, 2013  1:31 PM      Regarding: Please call in prescription       Please inform patient of BV infection and need for treatment. Please call in flagyl 500 mg BID for 7 days (disp 28) no refills            Thanks            Peggy ------Called pt and left message that I am calling with test results. Please call back and indicate if we can leave detailed information on your voice mail. We will call you back tomorrow.   *Pt needs to be informed of BV and Rx sent to her pharmacy

## 2013-01-08 NOTE — Telephone Encounter (Signed)
Pt informed of results.

## 2013-01-13 ENCOUNTER — Encounter: Payer: Self-pay | Admitting: *Deleted

## 2013-01-13 DIAGNOSIS — O2390 Unspecified genitourinary tract infection in pregnancy, unspecified trimester: Secondary | ICD-10-CM | POA: Insufficient documentation

## 2013-01-15 ENCOUNTER — Encounter: Payer: Self-pay | Admitting: Advanced Practice Midwife

## 2013-01-17 ENCOUNTER — Encounter (HOSPITAL_COMMUNITY): Payer: Self-pay | Admitting: *Deleted

## 2013-01-17 ENCOUNTER — Other Ambulatory Visit: Payer: Self-pay | Admitting: Advanced Practice Midwife

## 2013-01-17 ENCOUNTER — Inpatient Hospital Stay (HOSPITAL_COMMUNITY)
Admission: AD | Admit: 2013-01-17 | Discharge: 2013-01-17 | Disposition: A | Discharge status: Home / Self Care | Payer: Medicaid Other | Source: Ambulatory Visit | Attending: Obstetrics and Gynecology | Admitting: Obstetrics and Gynecology

## 2013-01-17 DIAGNOSIS — O4703 False labor before 37 completed weeks of gestation, third trimester: Secondary | ICD-10-CM

## 2013-01-17 DIAGNOSIS — O479 False labor, unspecified: Secondary | ICD-10-CM

## 2013-01-17 DIAGNOSIS — O99891 Other specified diseases and conditions complicating pregnancy: Secondary | ICD-10-CM | POA: Insufficient documentation

## 2013-01-17 DIAGNOSIS — M549 Dorsalgia, unspecified: Secondary | ICD-10-CM | POA: Insufficient documentation

## 2013-01-17 DIAGNOSIS — O4700 False labor before 37 completed weeks of gestation, unspecified trimester: Secondary | ICD-10-CM | POA: Insufficient documentation

## 2013-01-17 DIAGNOSIS — N898 Other specified noninflammatory disorders of vagina: Secondary | ICD-10-CM

## 2013-01-17 LAB — URINALYSIS, ROUTINE W REFLEX MICROSCOPIC
Bilirubin Urine: NEGATIVE
Glucose, UA: NEGATIVE mg/dL
Hgb urine dipstick: NEGATIVE
Ketones, ur: NEGATIVE mg/dL
Leukocytes, UA: NEGATIVE
Nitrite: NEGATIVE
Protein, ur: NEGATIVE mg/dL
Specific Gravity, Urine: <1.005 — ABNORMAL LOW (ref 1.005–1.030)
Urobilinogen, UA: 0.2 mg/dL (ref 0.0–1.0)
pH: 7 (ref 5.0–8.0)

## 2013-01-17 MED ORDER — NIFEDIPINE 10 MG PO CAPS
10.0000 mg | ORAL_CAPSULE | Freq: Three times a day (TID) | ORAL | Status: DC
  Administered 2013-01-17: 10 mg via ORAL
  Filled 2013-01-17: qty 1

## 2013-01-17 NOTE — MAU Provider Note (Signed)
  History     CSN: [id number]  Arrival date and time: 01/17/13 0246   First Provider Initiated Contact with Patient 01/17/13 0329      No chief complaint on file.  HPI This is a 27 y.o. female at [redacted]w[redacted]d who presents with c/o Large gush of fluid in car soaking her clothes, which appeared dry upon presentation. Was not wearing a pad. ALso c/o back pain coming and going  Like contractions. No bleeding'  RN NOte: PT SAYS SHE HAD LEAKING OF FLUID LAST WEEK - WAS TESTED - NO SROM- SAID SHE HAD BV. SAYS HAS LOWER ABD CRAMPS - THAT STARTED YESTERDAY- AT 230PM, SAW MORE LEAKING AT 2300PM. NO PAD ON IN TRIAGE. VE IN CLINIC - CLOSED       OB History   Grav Para Term Preterm Abortions TAB SAB Ect Mult Living   3 2 2       2       Past Medical History  Diagnosis Date  . Abscess and cellulitis   . Asthma   . Tobacco use disorder   . UTI (lower urinary tract infection)   . Heart murmur     Past Surgical History  Procedure Laterality Date  . No past surgeries      Family History  Problem Relation Age of Onset  . Cancer Maternal Aunt 19    breast  . Cancer Paternal Aunt 24    breast  . Diabetes Paternal Grandmother   . Heart disease Paternal Grandmother     History  Substance Use Topics  . Smoking status: Current Some Day Smoker -- 0.50 packs/day for 9 years    Types: Cigarettes  . Smokeless tobacco: Never Used  . Alcohol Use: No     Comment: occasion    Allergies:  Allergies  Allergen Reactions  . Ibuprofen Other (See Comments)    Causes blisters    No prescriptions prior to admission    Review of Systems  Constitutional: Negative for fever.  Gastrointestinal: Negative for nausea, vomiting, abdominal pain, diarrhea and constipation.  Musculoskeletal: Positive for back pain.  Neurological: Negative for dizziness and headaches.   Physical Exam   Blood pressure 98/54, pulse 83, temperature 98.3 F (36.8 C), temperature source Oral, resp. rate 20, height 5'  (1.524 m), weight 74.447 kg (164 lb 2 oz), last menstrual period 05/31/2012.  Physical Exam  Constitutional: She is oriented to person, place, and time. She appears well-developed and well-nourished. No distress.  HENT:  Head: Normocephalic.  Cardiovascular: Normal rate.   Respiratory: Effort normal.  GI: Soft. She exhibits no distension and no mass. There is no tenderness. There is no rebound and no guarding.  Genitourinary: Uterus normal. Vaginal discharge (scant white discharge, no pooling, no ferning, cervix closed/30%/-3) found.  Musculoskeletal: Normal range of motion.  Neurological: She is alert and oriented to person, place, and time.  Skin: Skin is warm and dry.  Psychiatric: She has a normal mood and affect.  FHR reactive Uterine irritability noted  MAU Course  Procedures  MDM Procardia given with resolution of irritabiilty No further leaking    Assessment and Plan  A:  SIUP at [redacted]w[redacted]d       Leaking fluid, probably urine      Uterine irritabiliity resolved with Procardia  P:  DIscharge home       PTL precautions       Followup in office  [facility] 01/17/2013, 5:23 AM

## 2013-01-17 NOTE — MAU Note (Addendum)
PT SAYS SHE  HAD LEAKING OF FLUID LAST WEEK - WAS TESTED - NO SROM- SAID SHE HAD  BV. SAYS HAS LOWER ABD CRAMPS - THAT  STARTED  YESTERDAY- AT 230PM,    SAW MORE  LEAKING  AT 2300PM.  NO PAD ON IN TRIAGE.   VE IN  CLINIC   -  CLOSED

## 2013-01-20 NOTE — MAU Provider Note (Signed)
Attestation of Attending Supervision of Advanced Practitioner (CNM/NP): Evaluation and management procedures were performed by the Advanced Practitioner under my supervision and collaboration.  I have reviewed the Advanced Practitioner's note and chart, and I agree with the management and plan.  Joeanthony Seeling 01/20/2013 11:47 AM

## 2013-01-24 ENCOUNTER — Inpatient Hospital Stay (HOSPITAL_COMMUNITY)
Admission: AD | Admit: 2013-01-24 | Discharge: 2013-01-25 | Disposition: A | Discharge status: Home / Self Care | Payer: Medicaid Other | Source: Ambulatory Visit | Attending: Obstetrics & Gynecology | Admitting: Obstetrics & Gynecology

## 2013-01-24 DIAGNOSIS — R109 Unspecified abdominal pain: Secondary | ICD-10-CM | POA: Insufficient documentation

## 2013-01-24 DIAGNOSIS — O212 Late vomiting of pregnancy: Secondary | ICD-10-CM | POA: Insufficient documentation

## 2013-01-24 DIAGNOSIS — O368190 Decreased fetal movements, unspecified trimester, not applicable or unspecified: Secondary | ICD-10-CM | POA: Insufficient documentation

## 2013-01-24 DIAGNOSIS — O4700 False labor before 37 completed weeks of gestation, unspecified trimester: Secondary | ICD-10-CM | POA: Insufficient documentation

## 2013-01-24 LAB — URINALYSIS, ROUTINE W REFLEX MICROSCOPIC
Bilirubin Urine: NEGATIVE
Hgb urine dipstick: NEGATIVE
Nitrite: NEGATIVE
Protein, ur: NEGATIVE mg/dL
Specific Gravity, Urine: <1.005 — ABNORMAL LOW (ref 1.005–1.030)
Urobilinogen, UA: 0.2 mg/dL (ref 0.0–1.0)

## 2013-01-24 MED ORDER — TRAMADOL HCL 50 MG PO TABS
100.0000 mg | ORAL_TABLET | Freq: Once | ORAL | Status: AC
  Administered 2013-01-24: 100 mg via ORAL
  Filled 2013-01-24: qty 2

## 2013-01-24 NOTE — MAU Note (Signed)
Patient is in with progressive lower abdominal pain, n/v. Denies vaginal bleeding, lof. She reports decreased fetal movement.

## 2013-01-25 DIAGNOSIS — O479 False labor, unspecified: Secondary | ICD-10-CM

## 2013-01-25 MED ORDER — TRAMADOL HCL 50 MG PO TABS: 50.0000 mg | ORAL_TABLET | Freq: Four times a day (QID) | ORAL | Status: DC | PRN

## 2013-01-25 NOTE — MAU Provider Note (Signed)
History     CSN: [id number]  Arrival date and time: 01/24/13 2239   None     Chief Complaint  Patient presents with  . Abdominal Pain   HPI Eileen Tate is a 27yo G3P2002 at 33.2wks who presents for eval of low abd discomfort and pressure/heaviness with standing. Denies leaking, bldg, or reg ctx. Reports decreased FM and nausea/vomiting x 2 today. Denies dysuria, fever or chills. She receives [facility] at the [facility] which has been remarkable for 1) abnl Pap 2) fetal echogenic bowel- resolved 3) plans BTL.  OB History   Grav Para Term Preterm Abortions TAB SAB Ect Mult Living   3 2 2       2       Past Medical History  Diagnosis Date  . Abscess and cellulitis   . Asthma   . Tobacco use disorder   . UTI (lower urinary tract infection)   . Heart murmur     Past Surgical History  Procedure Laterality Date  . No past surgeries      Family History  Problem Relation Age of Onset  . Cancer Maternal Aunt 19    breast  . Cancer Paternal Aunt 53    breast  . Diabetes Paternal Grandmother   . Heart disease Paternal Grandmother     History  Substance Use Topics  . Smoking status: Current Some Day Smoker -- 0.50 packs/day for 9 years    Types: Cigarettes  . Smokeless tobacco: Never Used  . Alcohol Use: No     Comment: occasion    Allergies:  Allergies  Allergen Reactions  . Ibuprofen Other (See Comments)    Causes blisters    Prescriptions prior to admission  Medication Sig Dispense Refill  . acetaminophen (TYLENOL) 325 MG tablet Take 650 mg by mouth every 6 (six) hours as needed for pain.      . cyclobenzaprine (FLEXERIL) 10 MG tablet Take 1 tablet (10 mg total) by mouth every 8 (eight) hours as needed for muscle spasms.  30 tablet  1  . diphenhydrAMINE (BENADRYL) 25 MG tablet Take 25 mg by mouth every 6 (six) hours as needed for itching.      . levalbuterol (XOPENEX HFA) 45 MCG/ACT inhaler Inhale 1 puff into the lungs daily as needed. For shortness of breath      .  Prenatal Vit-Fe Fumarate-FA (PRENATAL MULTIVITAMIN) TABS Take 1 tablet by mouth at bedtime.      . [DISCONTINUED] cyclobenzaprine (FLEXERIL) 10 MG tablet Take 1 tablet (10 mg total) by mouth 3 (three) times daily as needed for muscle spasms.  30 tablet  2  . [DISCONTINUED] metroNIDAZOLE (FLAGYL) 500 MG tablet Take 1 tablet (500 mg total) by mouth 2 (two) times daily.  14 tablet  0  . [DISCONTINUED] nitrofurantoin, macrocrystal-monohydrate, (MACROBID) 100 MG capsule Take 1 capsule (100 mg total) by mouth 2 (two) times daily.  14 capsule  0    ROS Physical Exam   Blood pressure 125/73, pulse 77, temperature 98.6 F (37 C), temperature source Oral, resp. rate 16, height 5' (1.524 m), weight 74.299 kg (163 lb 12.8 oz), last menstrual period 05/31/2012.  Physical Exam  Constitutional: She is oriented to person, place, and time. She appears well-developed.  HENT:  Head: Normocephalic.  Cardiovascular: Normal rate.   Respiratory: Effort normal.  GI:  FHR 120s +accels, no decels Uterine irritability  Genitourinary: Vagina normal.  Cx FT/50%/post/-2  Musculoskeletal: Normal range of motion.  Neurological: She is alert and  oriented to person, place, and time.  Skin: Skin is warm and dry.  Psychiatric: She has a normal mood and affect. Her behavior is normal. Thought content normal.   Urinalysis    Component Value Date/Time   COLORURINE YELLOW 01/24/2013 2245   APPEARANCEUR CLEAR 01/24/2013 2245   LABSPEC <1.005* 01/24/2013 2245   PHURINE 6.5 01/24/2013 2245   GLUCOSEU NEGATIVE 01/24/2013 2245   HGBUR NEGATIVE 01/24/2013 2245   BILIRUBINUR NEGATIVE 01/24/2013 2245   KETONESUR NEGATIVE 01/24/2013 2245   PROTEINUR NEGATIVE 01/24/2013 2245   UROBILINOGEN 0.2 01/24/2013 2245   NITRITE NEGATIVE 01/24/2013 2245   LEUKOCYTESUR NEGATIVE 01/24/2013 2245      MAU Course  Procedures  MDM Given Tramadol 100mg  while here- feels better and requesting d/c home  Assessment and Plan  IUP at  33.3wks Round lig pain/Braxton Hicks ctx  D/C home with preterm labor precautions and third trimester preg comfort tips Rx Tramadol 50mg  #20 F/U as scheduled at next [facility] visit   Cam Hai 01/25/2013, 12:03 AM

## 2013-02-01 ENCOUNTER — Inpatient Hospital Stay (HOSPITAL_COMMUNITY)
Admission: AD | Admit: 2013-02-01 | Discharge: 2013-02-02 | Disposition: A | Discharge status: Home / Self Care | Payer: Medicaid Other | Source: Ambulatory Visit | Attending: Obstetrics & Gynecology | Admitting: Obstetrics & Gynecology

## 2013-02-01 ENCOUNTER — Encounter (HOSPITAL_COMMUNITY): Payer: Self-pay | Admitting: *Deleted

## 2013-02-01 DIAGNOSIS — O4700 False labor before 37 completed weeks of gestation, unspecified trimester: Secondary | ICD-10-CM | POA: Insufficient documentation

## 2013-02-01 DIAGNOSIS — N949 Unspecified condition associated with female genital organs and menstrual cycle: Secondary | ICD-10-CM

## 2013-02-01 DIAGNOSIS — R109 Unspecified abdominal pain: Secondary | ICD-10-CM | POA: Insufficient documentation

## 2013-02-01 LAB — URINALYSIS, ROUTINE W REFLEX MICROSCOPIC
Glucose, UA: NEGATIVE mg/dL
Hgb urine dipstick: NEGATIVE
Ketones, ur: NEGATIVE mg/dL
Protein, ur: NEGATIVE mg/dL
Urobilinogen, UA: 0.2 mg/dL (ref 0.0–1.0)

## 2013-02-01 LAB — URINE MICROSCOPIC-ADD ON

## 2013-02-01 NOTE — MAU Note (Signed)
Patient complains of pelvic pain and abdominal cramping. Took Tramadol for pain around 5pm but its not helping. Patient also noticed some leaking of fluid last night that has got worst throughout the day today. Has had to change pants multiple times due to wetness.

## 2013-02-02 DIAGNOSIS — O4700 False labor before 37 completed weeks of gestation, unspecified trimester: Secondary | ICD-10-CM

## 2013-02-02 LAB — WET PREP, GENITAL

## 2013-02-02 MED ORDER — OXYCODONE-ACETAMINOPHEN 5-325 MG PO TABS
2.0000 | ORAL_TABLET | ORAL | Status: AC
  Administered 2013-02-02: 2 via ORAL
  Filled 2013-02-02: qty 2

## 2013-02-02 MED ORDER — NITROFURANTOIN MONOHYD MACRO 100 MG PO CAPS: 100.0000 mg | ORAL_CAPSULE | Freq: Two times a day (BID) | ORAL | Status: DC

## 2013-02-02 MED ORDER — NITROFURANTOIN MONOHYD MACRO 100 MG PO CAPS
100.0000 mg | ORAL_CAPSULE | ORAL | Status: AC
  Administered 2013-02-02: 100 mg via ORAL
  Filled 2013-02-02: qty 1

## 2013-02-02 NOTE — MAU Provider Note (Signed)
Chief Complaint:  Pelvic Pain, Abdominal Pain and Rupture of Membranes   First Provider Initiated Contact with Patient 02/02/13 0055      HPI: Eileen Tate is a 27 y.o. G3P2002 at [redacted]w[redacted]d who presents to maternity admissions reporting lower abdominal pain and pressure and leakage of fluid requiring she change her underwear/pants x3 today.  She reports leakage x2 weeks and had this evaluated in office and MAU. She took Tramadol as prescribed for her abdominal pain without relief.  She reports good fetal movement, denies vaginal bleeding, vaginal itching/burning, urinary symptoms, h/a, dizziness, n/v, or fever/chills.     Past Medical History: Past Medical History  Diagnosis Date  . Abscess and cellulitis   . Asthma   . Tobacco use disorder   . UTI (lower urinary tract infection)   . Heart murmur     Past obstetric history: OB History   Grav Para Term Preterm Abortions TAB SAB Ect Mult Living   3 2 2       2      # Outc Date GA Lbr Len/2nd Wgt Sex Del Anes PTL Lv   1 TRM 7/05 [redacted]w[redacted]d  6.295MW(4XL2GM) F SVD None     2 TRM 6/08 [redacted]w[redacted]d  0.102VO(5DG6YQ) F SVD None     3 CUR               Past Surgical History: Past Surgical History  Procedure Laterality Date  . No past surgeries      Family History: Family History  Problem Relation Age of Onset  . Cancer Maternal Aunt 19    breast  . Cancer Paternal Aunt 46    breast  . Diabetes Paternal Grandmother   . Heart disease Paternal Grandmother     Social History: History  Substance Use Topics  . Smoking status: Current Some Day Smoker -- 0.50 packs/day for 9 years    Types: Cigarettes  . Smokeless tobacco: Never Used  . Alcohol Use: No     Comment: occasion    Allergies:  Allergies  Allergen Reactions  . Ibuprofen Other (See Comments)    Causes blisters    Meds:  Prescriptions prior to admission  Medication Sig Dispense Refill  . acetaminophen (TYLENOL) 325 MG tablet Take 650 mg by mouth every 6 (six) hours as  needed for pain.      . cyclobenzaprine (FLEXERIL) 10 MG tablet Take 1 tablet (10 mg total) by mouth every 8 (eight) hours as needed for muscle spasms.  30 tablet  1  . diphenhydrAMINE (BENADRYL) 25 MG tablet Take 25 mg by mouth every 6 (six) hours as needed for itching.      . Prenatal Vit-Fe Fumarate-FA (PRENATAL MULTIVITAMIN) TABS Take 1 tablet by mouth at bedtime.      . traMADol (ULTRAM) 50 MG tablet Take 1 tablet (50 mg total) by mouth every 6 (six) hours as needed for pain.  20 tablet  0  . levalbuterol (XOPENEX HFA) 45 MCG/ACT inhaler Inhale 1 puff into the lungs daily as needed. For shortness of breath        ROS: Pertinent findings in history of present illness.  Physical Exam  Blood pressure 119/76, pulse 77, temperature 98.4 F (36.9 C), temperature source Oral, resp. rate 18, height 5' (1.524 m), weight 75.297 kg (166 lb), last menstrual period 05/31/2012. GENERAL: Well-developed, well-nourished female in no acute distress.  HEENT: normocephalic HEART: normal rate RESP: normal effort ABDOMEN: Soft, non-tender, gravid appropriate for gestational age EXTREMITIES: Nontender,  no edema NEURO: alert and oriented Pelvic exam: Cervix pink, visually closed, without lesion, moderate white frothy discharge, vaginal walls and external genitalia normal Bimanual exam: Cervix FT/50/high, soft, posterior   Dilation: Fingertip Effacement (%): 50 Station: -3 Exam by:: [place] CNM  FHT:  Baseline 135 , moderate variability, accelerations present, no decelerations Contractions: None on toco or to palpation   Labs: Results for orders placed during the hospital encounter of 02/01/13 (from the past 24 hour(s))  URINALYSIS, ROUTINE W REFLEX MICROSCOPIC     Status: Abnormal   Collection Time    02/01/13 11:13 PM      Result Value Range   Color, Urine YELLOW  YELLOW   APPearance CLEAR  CLEAR   Specific Gravity, Urine <1.005 (*) 1.005 - 1.030   pH 7.0  5.0 - 8.0   Glucose, UA NEGATIVE   NEGATIVE mg/dL   Hgb urine dipstick NEGATIVE  NEGATIVE   Bilirubin Urine NEGATIVE  NEGATIVE   Ketones, ur NEGATIVE  NEGATIVE mg/dL   Protein, ur NEGATIVE  NEGATIVE mg/dL   Urobilinogen, UA 0.2  0.0 - 1.0 mg/dL   Nitrite NEGATIVE  NEGATIVE   Leukocytes, UA SMALL (*) NEGATIVE  URINE MICROSCOPIC-ADD ON     Status: Abnormal   Collection Time    02/01/13 11:13 PM      Result Value Range   Squamous Epithelial / LPF FEW (*) RARE   WBC, UA 3-6  <3 WBC/hpf   RBC / HPF 0-2  <3 RBC/hpf   Bacteria, UA FEW (*) RARE  WET PREP, GENITAL     Status: Abnormal   Collection Time    02/02/13 12:59 AM      Result Value Range   Yeast Wet Prep HPF POC NONE SEEN  NONE SEEN   Trich, Wet Prep NONE SEEN  NONE SEEN   Clue Cells Wet Prep HPF POC FEW (*) NONE SEEN   WBC, Wet Prep HPF POC MODERATE (*) NONE SEEN  AMNISURE RUPTURE OF MEMBRANE (ROM)     Status: None   Collection Time    02/02/13  1:00 AM      Result Value Range   Amnisure ROM NEGATIVE      Assessment: 1. Threatened preterm labor, antepartum, third trimester   2. Pain of female symphysis pubis     Plan: Percocet 5/325 x2 in MAU Discharge home PTL precautions and fetal kick counts Recommend belly support band, rest, heat, ice for pain F/U in clinic at appointment on Monday Return to MAU as needed   Sharen Counter Certified Nurse-Midwife 02/02/2013 1:04 AM

## 2013-02-03 ENCOUNTER — Encounter: Payer: Self-pay | Admitting: *Deleted

## 2013-02-03 ENCOUNTER — Ambulatory Visit (INDEPENDENT_AMBULATORY_CARE_PROVIDER_SITE_OTHER): Payer: Medicaid Other | Admitting: Family Medicine

## 2013-02-03 VITALS — BP 126/89 | Temp 97.8°F | Wt 166.3 lb

## 2013-02-03 DIAGNOSIS — Z3490 Encounter for supervision of normal pregnancy, unspecified, unspecified trimester: Secondary | ICD-10-CM

## 2013-02-03 DIAGNOSIS — O2390 Unspecified genitourinary tract infection in pregnancy, unspecified trimester: Secondary | ICD-10-CM

## 2013-02-03 LAB — URINE CULTURE: Colony Count: 70000

## 2013-02-03 LAB — POCT URINALYSIS DIP (DEVICE)
Bilirubin Urine: NEGATIVE
Glucose, UA: NEGATIVE mg/dL
Hgb urine dipstick: NEGATIVE
Ketones, ur: NEGATIVE mg/dL
Specific Gravity, Urine: 1.02 (ref 1.005–1.030)

## 2013-02-03 NOTE — Progress Notes (Signed)
Pulse- 74  Edema- feet  Pain/pressure-pelvic, lower abd Vaginal discharge-milky

## 2013-02-03 NOTE — Progress Notes (Signed)
Pain in lower abdomen for a few days and swelling/pain in feet. Seen in MAU on 7/6 and treated for UTI, pubic symphysis pain. Taking tramadol, not working well.

## 2013-02-03 NOTE — Patient Instructions (Addendum)
Round Ligament Pain The round ligament is made up of muscle and fibrous tissue. It is attached to the uterus near the fallopian tube. The round ligament is located on both sides of the uterus and helps support the position of the uterus. It usually begins in the second trimester of pregnancy when the uterus comes out of the pelvis. The pain can come and go until the baby is delivered. Round ligament pain is not a serious problem and does not cause harm to the baby. CAUSE During pregnancy the uterus grows the most from the second trimester to delivery. As it grows, it stretches and slightly twists the round ligaments. When the uterus leans from one side to the other, the round ligament on the opposite side pulls and stretches. This can cause pain. SYMPTOMS  Pain can occur on one side or both sides. The pain is usually a short, sharp, and pinching-like. Sometimes it can be a dull, lingering and aching pain. The pain is located in the lower side of the abdomen or in the groin. The pain is internal and usually starts deep in the groin and moves up to the outside of the hip area. Pain can occur with:  Sudden change in position like getting out of bed or a chair.  Rolling over in bed.  Coughing or sneezing.  Walking too much.  Any type of physical activity. DIAGNOSIS  Your caregiver will make sure there are no serious problems causing the pain. When nothing serious is found, the symptoms usually indicate that the pain is from the round ligament. TREATMENT   Sit down and relax when the pain starts.  Flex your knees up to your belly.  Lay on your side with a pillow under your belly (abdomen) and another one between your legs.  Sit in a hot bath for 15 to 20 minutes or until the pain goes away. HOME CARE INSTRUCTIONS   Only take over-the-counter or prescriptions medicines for pain, discomfort or fever as directed by your caregiver.  Sit and stand slowly.  Avoid long walks if it causes  pain.  Stop or lessen your physical activities if it causes pain. SEEK MEDICAL CARE IF:   The pain does not go away with any of your treatment.  You need stronger medication for the pain.  You develop back pain that you did not have before with the side pain. SEEK IMMEDIATE MEDICAL CARE IF:   You develop a temperature of 102 F (38.9 C) or higher.  You develop uterine contractions.  You develop vaginal bleeding.  You develop nausea, vomiting or diarrhea.  You develop chills.  You have pain when you urinate. Document Released: 04/25/2008 Document Revised: 10/09/2011 Document Reviewed: 04/25/2008 [facility] Patient Information 2014 [place], [place].    Urinary Tract Infection in Pregnancy A urinary tract infection (UTI) is a bacterial infection of the urinary tract. Infection of the urinary tract can include the ureters, kidneys (pyelonephritis), bladder (cystitis), and urethra (urethritis). All pregnant women should be screened for bacteria in the urinary tract. Identifying and treating a UTI will decrease the risk of preterm labor and developing more serious infections in both the mother and baby. CAUSES Bacteria germs cause almost all UTIs. There are many factors that can increase your chances of getting a UTI during pregnancy. These include:  Having a short urethra.  Poor toilet and hygiene habits.  Sexual intercourse.  Blockage of urine along the urinary tract.  Problems with the pelvic muscles or nerves.  Diabetes.  Obesity.  Bladder problems after having several children.  Previous history of UTI. SYMPTOMS   Pain, burning, or a stinging feeling when urinating.  Suddenly feeling the need to urinate right away (urgency).  Loss of bladder control (urinary incontinence).  Frequent urination, more than is common with pregnancy.  Lower abdominal or back discomfort.  Cloudy urine.  Blood in the urine (hematuria).  Fever. When the kidneys are infected,  the symptoms may be:  Back pain.  Flank pain on the right side more so than the left.  Fever.  Chills.  Nausea.  Vomiting. DIAGNOSIS   Urine tests.  Additional tests and procedures may include:  Ultrasound of the kidneys, ureters, bladder, and urethra.  Looking in the bladder with a lighted tube (cystoscopy).  Certain X-ray studies only when absolutely necessary. Finding out the results of your test Ask when your test results will be ready. Make sure you get your test results. TREATMENT  Antibiotic medicine by mouth.  Antibiotics given through the vein (intravenously), if needed. HOME CARE INSTRUCTIONS   Take your antibiotics as directed. Finish them even if you start to feel better. Only take medicine as directed by your caregiver.  Drink enough fluids to keep your urine clear or pale yellow.  Do not have sexual intercourse until the infection is gone and your caregiver says it is okay.  Make sure you are tested for UTIs throughout your pregnancy if you get one. These infections often come back. Preventing a UTI in the future:  Practice good toilet habits. Always wipe from front to back. Use the tissue only once.  Do not hold your urine. Empty your bladder as soon as possible when the urge comes.  Do not douche or use deodorant sprays.  Wash with soap and warm water around the genital area and the anus.  Empty your bladder before and after sexual intercourse.  Wear underwear with a cotton crotch.  Avoid caffeine and carbonated drinks. They can irritate the bladder.  Drink cranberry juice or take cranberry pills. This may decrease the risk of getting a UTI.  Do not drink alcohol.  Keep all your appointments and tests as scheduled. SEEK MEDICAL CARE IF:   Your symptoms get worse.  You are still having fevers 2 or more days after treatment begins.  You develop a rash.  You feel that you are having problems with medicines prescribed.  You develop  abnormal vaginal discharge. SEEK IMMEDIATE MEDICAL CARE IF:   You develop back or flank pain.  You develop chills.  You have blood in your urine.  You develop nausea and vomiting.  You develop contractions of your uterus.  You have a gush of fluid from the vagina. MAKE SURE YOU:   Understand these instructions.  Will watch your condition.  Will get help right away if you are not doing well or get worse. Document Released: 11/11/2010 Document Revised: 07/03/2012 Document Reviewed: 11/11/2010 [facility] Patient Information 2014 [place], [place].

## 2013-02-06 ENCOUNTER — Telehealth: Payer: Self-pay

## 2013-02-06 ENCOUNTER — Inpatient Hospital Stay (HOSPITAL_COMMUNITY)
Admission: AD | Admit: 2013-02-06 | Discharge: 2013-02-06 | Disposition: A | Discharge status: Home / Self Care | Payer: Medicaid Other | Source: Ambulatory Visit | Attending: Obstetrics & Gynecology | Admitting: Obstetrics & Gynecology

## 2013-02-06 ENCOUNTER — Encounter (HOSPITAL_COMMUNITY): Payer: Self-pay | Admitting: *Deleted

## 2013-02-06 DIAGNOSIS — R109 Unspecified abdominal pain: Secondary | ICD-10-CM | POA: Insufficient documentation

## 2013-02-06 DIAGNOSIS — R55 Syncope and collapse: Secondary | ICD-10-CM | POA: Insufficient documentation

## 2013-02-06 LAB — URINE MICROSCOPIC-ADD ON

## 2013-02-06 LAB — URINALYSIS, ROUTINE W REFLEX MICROSCOPIC
Glucose, UA: NEGATIVE mg/dL
Hgb urine dipstick: NEGATIVE
Protein, ur: NEGATIVE mg/dL
Specific Gravity, Urine: 1.01 (ref 1.005–1.030)
pH: 6 (ref 5.0–8.0)

## 2013-02-06 LAB — CBC
MCH: 29.3 pg (ref 26.0–34.0)
Platelets: 175 10*3/uL (ref 150–400)
RBC: 3.48 MIL/uL — ABNORMAL LOW (ref 3.87–5.11)
RDW: 13.7 % (ref 11.5–15.5)
WBC: 11 10*3/uL — ABNORMAL HIGH (ref 4.0–10.5)

## 2013-02-06 LAB — COMPREHENSIVE METABOLIC PANEL
ALT: 5 U/L (ref 0–35)
AST: 10 U/L (ref 0–37)
Albumin: 2.1 g/dL — ABNORMAL LOW (ref 3.5–5.2)
CO2: 24 mEq/L (ref 19–32)
Calcium: 9.3 mg/dL (ref 8.4–10.5)
Creatinine, Ser: 0.62 mg/dL (ref 0.50–1.10)
GFR calc non Af Amer: >90 mL/min (ref >90)
Sodium: 134 mEq/L — ABNORMAL LOW (ref 135–145)

## 2013-02-06 MED ORDER — POTASSIUM CHLORIDE ER 10 MEQ PO TBCR: 20.0000 meq | EXTENDED_RELEASE_TABLET | Freq: Two times a day (BID) | ORAL | Status: DC

## 2013-02-06 NOTE — Progress Notes (Signed)
CRITICAL VALUE ALERT  Critical value received: K+ 3.7  Date of notification:  02/06/13  Time of notification:  2020  Critical value read back:yes  Nurse who received alert:  K.Terrah Decoster,RN  MD notified (1st page):  H.Hogan,CNM  Time of first page:  2020  MD notified (2nd page):  Time of second page:  Responding MD:  Time MD responded:

## 2013-02-06 NOTE — MAU Provider Note (Signed)
Chief Complaint:  Loss of Vision   None     HPI: Eileen Tate is a 27 y.o. G3P2002 at [redacted]w[redacted]d who presents to maternity admissions reporting several episodes of loss of vision and near syncope in the last week.  She reports drinking enough fluid, eating regularly and denies having any of these symptoms before now.  She has not fallen but reports someone has had to "catch" her and help her sit down several times.  She reports good fetal movement, denies LOF, vaginal bleeding, vaginal itching/burning, urinary symptoms, h/a, dizziness, n/v, or fever/chills.     Past Medical History: Past Medical History  Diagnosis Date  . Abscess and cellulitis   . Asthma   . Tobacco use disorder   . UTI (lower urinary tract infection)   . Heart murmur     Past obstetric history: OB History   Grav Para Term Preterm Abortions TAB SAB Ect Mult Living   3 2 2       2      # Outc Date GA Lbr Len/2nd Wgt Sex Del Anes PTL Lv   1 TRM 7/05 [redacted]w[redacted]d  1.191YN(8GN5AO) F SVD None     2 TRM 6/08 [redacted]w[redacted]d  1.308MV(7QI6NG) F SVD None     3 CUR               Past Surgical History: Past Surgical History  Procedure Laterality Date  . No past surgeries      Family History: Family History  Problem Relation Age of Onset  . Cancer Maternal Aunt 19    breast  . Cancer Paternal Aunt 23    breast  . Diabetes Paternal Grandmother   . Heart disease Paternal Grandmother     Social History: History  Substance Use Topics  . Smoking status: Current Some Day Smoker -- 0.50 packs/day for 9 years    Types: Cigarettes  . Smokeless tobacco: Never Used  . Alcohol Use: No     Comment: occasion    Allergies:  Allergies  Allergen Reactions  . Ibuprofen Other (See Comments)    Causes blisters    Meds:  Prescriptions prior to admission  Medication Sig Dispense Refill  . acetaminophen (TYLENOL) 325 MG tablet Take 650 mg by mouth every 6 (six) hours as needed for pain.      . cyclobenzaprine (FLEXERIL) 10 MG tablet  Take 1 tablet (10 mg total) by mouth every 8 (eight) hours as needed for muscle spasms.  30 tablet  1  . diphenhydrAMINE (BENADRYL) 25 MG tablet Take 25 mg by mouth every 6 (six) hours as needed for itching.      . nitrofurantoin, macrocrystal-monohydrate, (MACROBID) 100 MG capsule Take 1 capsule (100 mg total) by mouth 2 (two) times daily. First dose given at [facility] for total of 14 tabs.  13 capsule  0  . Prenatal Vit-Fe Fumarate-FA (PRENATAL MULTIVITAMIN) TABS Take 1 tablet by mouth at bedtime.      . traMADol (ULTRAM) 50 MG tablet Take 1 tablet (50 mg total) by mouth every 6 (six) hours as needed for pain.  20 tablet  0  . levalbuterol (XOPENEX HFA) 45 MCG/ACT inhaler Inhale 1 puff into the lungs daily as needed. For shortness of breath        ROS: Pertinent findings in history of present illness.  Physical Exam  Blood pressure 115/74, pulse 102, temperature 98 F (36.7 C), temperature source Oral, resp. rate 18, height 5' (1.524 m), weight 77.021 kg (  169 lb 12.8 oz), last menstrual period 05/31/2012. GENERAL: Well-developed, well-nourished female in no acute distress.  HEENT: normocephalic HEART: normal rate, rhythm, heart sounds RESP: normal effort, lung sounds clear and equal bilaterally ABDOMEN: Soft, non-tender, gravid appropriate for gestational age EXTREMITIES: Nontender, no edema NEURO: Alert, oriented, normal speech, no focal findings or movement disorder noted, cranial nerves II through XII intact, DTR's normal and symmetric, motor and sensory grossly normal bilaterally, normal muscle tone, no tremors, strength 5/5 SPECULUM EXAM: NEFG, physiologic discharge, no blood, cervix clean    FHT:  Baseline 130 , moderate variability, accelerations present (15x15), no decelerations Contractions: None on toco or to palpation   Labs: Results for orders placed during the hospital encounter of 02/06/13 (from the past 168 hour(s))  URINALYSIS, ROUTINE W REFLEX MICROSCOPIC    Collection Time    02/06/13  5:30 PM      Result Value Range   Color, Urine YELLOW  YELLOW   APPearance CLEAR  CLEAR   Specific Gravity, Urine 1.010  1.005 - 1.030   pH 6.0  5.0 - 8.0   Glucose, UA NEGATIVE  NEGATIVE mg/dL   Hgb urine dipstick NEGATIVE  NEGATIVE   Bilirubin Urine NEGATIVE  NEGATIVE   Ketones, ur NEGATIVE  NEGATIVE mg/dL   Protein, ur NEGATIVE  NEGATIVE mg/dL   Urobilinogen, UA 0.2  0.0 - 1.0 mg/dL   Nitrite NEGATIVE  NEGATIVE   Leukocytes, UA TRACE (*) NEGATIVE  URINE MICROSCOPIC-ADD ON   Collection Time    02/06/13  5:30 PM      Result Value Range   Squamous Epithelial / LPF FEW (*) RARE   WBC, UA 0-2  <3 WBC/hpf   RBC / HPF 0-2  <3 RBC/hpf   Bacteria, UA FEW (*) RARE  CBC   Collection Time    02/06/13  7:35 PM      Result Value Range   WBC 11.0 (*) 4.0 - 10.5 K/uL   RBC 3.48 (*) 3.87 - 5.11 MIL/uL   Hemoglobin 10.2 (*) 12.0 - 15.0 g/dL   HCT 16.1 (*) 09.6 - 04.5 %   MCV 85.3  78.0 - 100.0 fL   MCH 29.3  26.0 - 34.0 pg   MCHC 34.3  30.0 - 36.0 g/dL   RDW 40.9  81.1 - 91.4 %   Platelets 175  150 - 400 K/uL  COMPREHENSIVE METABOLIC PANEL   Collection Time    02/06/13  7:35 PM      Result Value Range   Sodium 134 (*) 135 - 145 mEq/L   Potassium 2.7 (*) 3.5 - 5.1 mEq/L   Chloride 102  96 - 112 mEq/L   CO2 24  19 - 32 mEq/L   Glucose, Bld 86  70 - 99 mg/dL   BUN 4 (*) 6 - 23 mg/dL   Creatinine, Ser 7.82  0.50 - 1.10 mg/dL   Calcium 9.3  8.4 - 95.6 mg/dL   Total Protein 5.4 (*) 6.0 - 8.3 g/dL   Albumin 2.1 (*) 3.5 - 5.2 g/dL   AST 10  0 - 37 U/L   ALT 5  0 - 35 U/L   Alkaline Phosphatase 138 (*) 39 - 117 U/L   Total Bilirubin 0.2 (*) 0.3 - 1.2 mg/dL   GFR calc non Af Amer >90  >90 mL/min   GFR calc Af Amer >90  >90 mL/min     Assessment: 1. Near syncope     Plan: Discussed pt care with Dr  Roseanne Reno, on call Neurologist.  No CT recommended at this time Called Dr Marice Potter to discuss assessment and findings Cardiology outpatient referral  made Potassium 20 meq BID Discharge home PTL precautions and fetal kick counts F/U as scheduled in clinic Return to MAU as needed    Medication List         acetaminophen 325 MG tablet  Commonly known as:  TYLENOL  Take 650 mg by mouth every 6 (six) hours as needed for pain.     cyclobenzaprine 10 MG tablet  Commonly known as:  FLEXERIL  Take 1 tablet (10 mg total) by mouth every 8 (eight) hours as needed for muscle spasms.     diphenhydrAMINE 25 MG tablet  Commonly known as:  BENADRYL  Take 25 mg by mouth every 6 (six) hours as needed for itching.     levalbuterol 45 MCG/ACT inhaler  Commonly known as:  XOPENEX HFA  Inhale 1 puff into the lungs daily as needed. For shortness of breath     nitrofurantoin (macrocrystal-monohydrate) 100 MG capsule  Commonly known as:  MACROBID  Take 1 capsule (100 mg total) by mouth 2 (two) times daily. First dose given at [facility] for total of 14 tabs.     potassium chloride 10 MEQ tablet  Commonly known as:  K-DUR  Take 2 tablets (20 mEq total) by mouth 2 (two) times daily.     prenatal multivitamin Tabs  Take 1 tablet by mouth at bedtime.     traMADol 50 MG tablet  Commonly known as:  ULTRAM  Take 1 tablet (50 mg total) by mouth every 6 (six) hours as needed for pain.        Sharen Counter Certified Nurse-Midwife 02/06/2013 8:01 PM

## 2013-02-06 NOTE — MAU Note (Signed)
Pt reports she has been having episodes of loosing her vision and almost "passing out. Been having some abd cramping as well.

## 2013-02-06 NOTE — Telephone Encounter (Signed)
Pt called and stated that she is hurting in her stomach area.   Called pt and left message to return call to the clinics.

## 2013-02-10 ENCOUNTER — Telehealth: Payer: Self-pay | Admitting: *Deleted

## 2013-02-10 NOTE — Telephone Encounter (Signed)
Patient called into front desk requesting to be seen today. I came to front desk to speak with patient. She stated that she fell on Saturday. Patient reports that she fell on her knees and did not hit her abdomen at all. Baby is moving well. She denies any bleeding, vaginal discharge, or loss of fluid. Pt not having contractions.  I advised that she rest and stay well hydrated. I instructed her to keep appt for Weds and if she notices a decrease in fetal movement, has bleeding, fluid leakage she should report to MAU immediately. Pt voiced understanding and will followup as planned.

## 2013-02-10 NOTE — Telephone Encounter (Signed)
Spoke with patient this morning. See other tel enc dated 02/10/13

## 2013-02-12 ENCOUNTER — Encounter: Payer: Self-pay | Admitting: Obstetrics and Gynecology

## 2013-02-12 ENCOUNTER — Ambulatory Visit (INDEPENDENT_AMBULATORY_CARE_PROVIDER_SITE_OTHER): Payer: Medicaid Other | Admitting: Obstetrics and Gynecology

## 2013-02-12 VITALS — BP 111/75 | Temp 98.1°F | Wt 169.0 lb

## 2013-02-12 DIAGNOSIS — N949 Unspecified condition associated with female genital organs and menstrual cycle: Secondary | ICD-10-CM

## 2013-02-12 DIAGNOSIS — O26899 Other specified pregnancy related conditions, unspecified trimester: Secondary | ICD-10-CM

## 2013-02-12 DIAGNOSIS — O9989 Other specified diseases and conditions complicating pregnancy, childbirth and the puerperium: Secondary | ICD-10-CM

## 2013-02-12 DIAGNOSIS — Z3493 Encounter for supervision of normal pregnancy, unspecified, third trimester: Secondary | ICD-10-CM

## 2013-02-12 LAB — OB RESULTS CONSOLE GBS: GBS: NEGATIVE

## 2013-02-12 LAB — POCT URINALYSIS DIP (DEVICE)
Bilirubin Urine: NEGATIVE
Glucose, UA: NEGATIVE mg/dL
Ketones, ur: NEGATIVE mg/dL
Nitrite: NEGATIVE

## 2013-02-12 NOTE — Patient Instructions (Signed)

## 2013-02-12 NOTE — Progress Notes (Signed)
Pulse 67  Edema 2+ trace in legs and feet.  C/o pain on pelvic and vaginal area since last night 8/10 on pain scale. Vaginal d/c stated as thin clear; no itch, no odor.

## 2013-02-12 NOTE — Progress Notes (Signed)
Takes Ultram for lower abd and pelvic pain, worse with walking. Completed meds for ASB.Good FM. PPS consent.

## 2013-02-13 LAB — GC/CHLAMYDIA PROBE AMP
CT Probe RNA: NEGATIVE
GC Probe RNA: NEGATIVE

## 2013-02-15 LAB — CULTURE, BETA STREP (GROUP B ONLY)

## 2013-02-19 ENCOUNTER — Ambulatory Visit (INDEPENDENT_AMBULATORY_CARE_PROVIDER_SITE_OTHER): Payer: Medicaid Other | Admitting: Family

## 2013-02-19 VITALS — BP 119/80 | Temp 98.1°F | Wt 171.8 lb

## 2013-02-19 DIAGNOSIS — O2390 Unspecified genitourinary tract infection in pregnancy, unspecified trimester: Secondary | ICD-10-CM

## 2013-02-19 DIAGNOSIS — Z3493 Encounter for supervision of normal pregnancy, unspecified, third trimester: Secondary | ICD-10-CM

## 2013-02-19 DIAGNOSIS — E876 Hypokalemia: Secondary | ICD-10-CM

## 2013-02-19 LAB — POCT URINALYSIS DIP (DEVICE)
Glucose, UA: NEGATIVE mg/dL
Nitrite: NEGATIVE
Protein, ur: NEGATIVE mg/dL
Specific Gravity, Urine: 1.015 (ref 1.005–1.030)
Urobilinogen, UA: 0.2 mg/dL (ref 0.0–1.0)

## 2013-02-19 NOTE — Progress Notes (Signed)
[facility] [phone] appt scheduled for July 28th @ 0915 with Dr. Peter [place].  Called pt and informed pt of appointment.  Gave pt # and address to location of facility.  Pt stated understanding and did not have any other questions.

## 2013-02-19 NOTE — Progress Notes (Signed)
Pulse- 67 Patient reports vaginal/pelvic pressure and lower abdominal pain

## 2013-02-19 NOTE — Progress Notes (Signed)
Call [phone] for potassium results.

## 2013-02-19 NOTE — Progress Notes (Signed)
Reports increased pedal swelling, elevating extremities; did not take potassium supplement as prescribed by Eileen Tate for hypokalemia (2.8) due to recommendations of pharmacist; increased bananas and orange juice.  Repeat cmp today.  Schedule cardiology referral as indicated in MAU note for near syncope.

## 2013-02-20 LAB — COMPREHENSIVE METABOLIC PANEL
AST: 12 U/L (ref 0–37)
Albumin: 3.1 g/dL — ABNORMAL LOW (ref 3.5–5.2)
Alkaline Phosphatase: 146 U/L — ABNORMAL HIGH (ref 39–117)
Glucose, Bld: 65 mg/dL — ABNORMAL LOW (ref 70–99)
Potassium: 3.2 mEq/L — ABNORMAL LOW (ref 3.5–5.3)
Sodium: 138 mEq/L (ref 135–145)
Total Bilirubin: 0.2 mg/dL — ABNORMAL LOW (ref 0.3–1.2)
Total Protein: 5.7 g/dL — ABNORMAL LOW (ref 6.0–8.3)

## 2013-02-21 ENCOUNTER — Encounter: Payer: Self-pay | Admitting: *Deleted

## 2013-02-24 ENCOUNTER — Ambulatory Visit: Payer: Medicaid Other | Admitting: Cardiology

## 2013-02-25 ENCOUNTER — Inpatient Hospital Stay (HOSPITAL_COMMUNITY)
Admission: AD | Admit: 2013-02-25 | Discharge: 2013-02-26 | DRG: 775 | Disposition: A | Discharge status: Home / Self Care | Payer: Medicaid Other | Source: Ambulatory Visit | Attending: Family Medicine | Admitting: Family Medicine

## 2013-02-25 ENCOUNTER — Encounter (HOSPITAL_COMMUNITY): Payer: Self-pay | Admitting: *Deleted

## 2013-02-25 ENCOUNTER — Inpatient Hospital Stay (HOSPITAL_COMMUNITY): Payer: Medicaid Other | Admitting: Anesthesiology

## 2013-02-25 ENCOUNTER — Encounter (HOSPITAL_COMMUNITY): Payer: Self-pay | Admitting: Anesthesiology

## 2013-02-25 DIAGNOSIS — O99334 Smoking (tobacco) complicating childbirth: Secondary | ICD-10-CM

## 2013-02-25 DIAGNOSIS — Z3493 Encounter for supervision of normal pregnancy, unspecified, third trimester: Secondary | ICD-10-CM

## 2013-02-25 DIAGNOSIS — IMO0001 Reserved for inherently not codable concepts without codable children: Secondary | ICD-10-CM

## 2013-02-25 DIAGNOSIS — O2390 Unspecified genitourinary tract infection in pregnancy, unspecified trimester: Secondary | ICD-10-CM

## 2013-02-25 LAB — RPR: RPR Ser Ql: NONREACTIVE

## 2013-02-25 LAB — CBC
Hemoglobin: 12.5 g/dL (ref 12.0–15.0)
MCH: 29.9 pg (ref 26.0–34.0)
MCHC: 35.3 g/dL (ref 30.0–36.0)
MCV: 84.7 fL (ref 78.0–100.0)
Platelets: 237 10*3/uL (ref 150–400)
RBC: 4.18 MIL/uL (ref 3.87–5.11)

## 2013-02-25 MED ORDER — DIPHENHYDRAMINE HCL 50 MG/ML IJ SOLN: 12.5000 mg | INTRAMUSCULAR | Status: DC | PRN

## 2013-02-25 MED ORDER — LANOLIN HYDROUS EX OINT: TOPICAL_OINTMENT | CUTANEOUS | Status: DC | PRN

## 2013-02-25 MED ORDER — PHENYLEPHRINE 40 MCG/ML (10ML) SYRINGE FOR IV PUSH (FOR BLOOD PRESSURE SUPPORT)
80.0000 ug | PREFILLED_SYRINGE | INTRAVENOUS | Status: DC | PRN
  Filled 2013-02-25: qty 2

## 2013-02-25 MED ORDER — TETANUS-DIPHTH-ACELL PERTUSSIS 5-2.5-18.5 LF-MCG/0.5 IM SUSP: 0.5000 mL | Freq: Once | INTRAMUSCULAR | Status: DC

## 2013-02-25 MED ORDER — CITRIC ACID-SODIUM CITRATE 334-500 MG/5ML PO SOLN: 30.0000 mL | ORAL | Status: DC | PRN

## 2013-02-25 MED ORDER — FENTANYL CITRATE 0.05 MG/ML IJ SOLN
100.0000 ug | INTRAMUSCULAR | Status: DC | PRN
  Administered 2013-02-25: 100 ug via INTRAVENOUS
  Filled 2013-02-25: qty 2

## 2013-02-25 MED ORDER — WITCH HAZEL-GLYCERIN EX PADS: 1.0000 "application " | MEDICATED_PAD | CUTANEOUS | Status: DC | PRN

## 2013-02-25 MED ORDER — DIBUCAINE 1 % RE OINT: 1.0000 "application " | TOPICAL_OINTMENT | RECTAL | Status: DC | PRN

## 2013-02-25 MED ORDER — LIDOCAINE HCL (PF) 1 % IJ SOLN
INTRAMUSCULAR | Status: DC | PRN
  Administered 2013-02-25: 5 mL

## 2013-02-25 MED ORDER — LIDOCAINE HCL (PF) 1 % IJ SOLN
30.0000 mL | INTRAMUSCULAR | Status: DC | PRN
  Filled 2013-02-25 (×2): qty 30

## 2013-02-25 MED ORDER — PHENYLEPHRINE 40 MCG/ML (10ML) SYRINGE FOR IV PUSH (FOR BLOOD PRESSURE SUPPORT)
80.0000 ug | PREFILLED_SYRINGE | INTRAVENOUS | Status: DC | PRN
  Filled 2013-02-25: qty 2
  Filled 2013-02-25: qty 5

## 2013-02-25 MED ORDER — OXYCODONE-ACETAMINOPHEN 5-325 MG PO TABS: 1.0000 | ORAL_TABLET | ORAL | Status: DC | PRN

## 2013-02-25 MED ORDER — OXYTOCIN 40 UNITS IN LACTATED RINGERS INFUSION - SIMPLE MED
62.5000 mL/h | INTRAVENOUS | Status: DC
  Administered 2013-02-25: 62.5 mL/h via INTRAVENOUS
  Filled 2013-02-25: qty 1000

## 2013-02-25 MED ORDER — FENTANYL 2.5 MCG/ML BUPIVACAINE 1/10 % EPIDURAL INFUSION (WH - ANES)
14.0000 mL/h | INTRAMUSCULAR | Status: DC | PRN
  Administered 2013-02-25: 14 mL/h via EPIDURAL
  Filled 2013-02-25: qty 125

## 2013-02-25 MED ORDER — ONDANSETRON HCL 4 MG PO TABS: 4.0000 mg | ORAL_TABLET | ORAL | Status: DC | PRN

## 2013-02-25 MED ORDER — ZOLPIDEM TARTRATE 5 MG PO TABS: 5.0000 mg | ORAL_TABLET | Freq: Every evening | ORAL | Status: DC | PRN

## 2013-02-25 MED ORDER — ACETAMINOPHEN 325 MG PO TABS: 650.0000 mg | ORAL_TABLET | ORAL | Status: DC | PRN

## 2013-02-25 MED ORDER — ONDANSETRON HCL 4 MG/2ML IJ SOLN: 4.0000 mg | INTRAMUSCULAR | Status: DC | PRN

## 2013-02-25 MED ORDER — LACTATED RINGERS IV SOLN
500.0000 mL | Freq: Once | INTRAVENOUS | Status: AC
  Administered 2013-02-25: 500 mL via INTRAVENOUS

## 2013-02-25 MED ORDER — DIPHENHYDRAMINE HCL 25 MG PO CAPS: 25.0000 mg | ORAL_CAPSULE | Freq: Four times a day (QID) | ORAL | Status: DC | PRN

## 2013-02-25 MED ORDER — PRENATAL MULTIVITAMIN CH
1.0000 | ORAL_TABLET | Freq: Every day | ORAL | Status: DC
  Administered 2013-02-25: 1 via ORAL
  Filled 2013-02-25: qty 1

## 2013-02-25 MED ORDER — BENZOCAINE-MENTHOL 20-0.5 % EX AERO: 1.0000 "application " | INHALATION_SPRAY | CUTANEOUS | Status: DC | PRN

## 2013-02-25 MED ORDER — SIMETHICONE 80 MG PO CHEW: 80.0000 mg | CHEWABLE_TABLET | ORAL | Status: DC | PRN

## 2013-02-25 MED ORDER — BUPIVACAINE HCL (PF) 0.25 % IJ SOLN: INTRAMUSCULAR | Status: DC | PRN

## 2013-02-25 MED ORDER — LACTATED RINGERS IV SOLN: 500.0000 mL | INTRAVENOUS | Status: DC | PRN

## 2013-02-25 MED ORDER — ONDANSETRON HCL 4 MG/2ML IJ SOLN: 4.0000 mg | Freq: Four times a day (QID) | INTRAMUSCULAR | Status: DC | PRN

## 2013-02-25 MED ORDER — EPHEDRINE 5 MG/ML INJ
10.0000 mg | INTRAVENOUS | Status: DC | PRN
  Filled 2013-02-25: qty 2

## 2013-02-25 MED ORDER — OXYCODONE-ACETAMINOPHEN 5-325 MG PO TABS
1.0000 | ORAL_TABLET | ORAL | Status: DC | PRN
  Administered 2013-02-25 – 2013-02-26 (×2): 1 via ORAL
  Filled 2013-02-25: qty 1

## 2013-02-25 MED ORDER — EPHEDRINE 5 MG/ML INJ
10.0000 mg | INTRAVENOUS | Status: DC | PRN
  Filled 2013-02-25: qty 4
  Filled 2013-02-25: qty 2

## 2013-02-25 MED ORDER — SENNOSIDES-DOCUSATE SODIUM 8.6-50 MG PO TABS: 2.0000 | ORAL_TABLET | Freq: Every day | ORAL | Status: DC

## 2013-02-25 MED ORDER — BUPIVACAINE HCL (PF) 0.25 % IJ SOLN
INTRAMUSCULAR | Status: DC | PRN
  Administered 2013-02-25: 10 mL via EPIDURAL

## 2013-02-25 MED ORDER — ACETAMINOPHEN 325 MG PO TABS: 650.0000 mg | ORAL_TABLET | Freq: Four times a day (QID) | ORAL | Status: DC | PRN

## 2013-02-25 MED ORDER — OXYTOCIN BOLUS FROM INFUSION: 500.0000 mL | INTRAVENOUS | Status: DC

## 2013-02-25 MED ORDER — LACTATED RINGERS IV SOLN
INTRAVENOUS | Status: DC
  Administered 2013-02-25: 04:00:00 via INTRAVENOUS

## 2013-02-25 NOTE — H&P (Signed)
Eileen Tate is a 27 y.o. female G3P2002 pt of [facility] @ [redacted]w[redacted]d presenting for labor evaluation.  Contractions started 2-3 hours ago and have become more frequent and more painful over time.  She reports good fetal movement, denies LOF, vaginal bleeding, vaginal itching/burning, urinary symptoms, h/a, dizziness, n/v, or fever/chills.    Maternal Medical History:  Reason for admission: Contractions.  Nausea.  Contractions: Onset was 1-2 hours ago.   Frequency: regular.   Duration is approximately 1 minute.   Perceived severity is strong.    Fetal activity: Perceived fetal activity is normal.   Last perceived fetal movement was within the past hour.    Prenatal complications: Syncopal episodes, hypokalemia  Prenatal Complications - Diabetes: none.    OB History   Grav Para Term Preterm Abortions TAB SAB Ect Mult Living   3 2 2       2      Past Medical History  Diagnosis Date  . Abscess and cellulitis   . Asthma   . Tobacco use disorder   . UTI (lower urinary tract infection)   . Heart murmur    Past Surgical History  Procedure Laterality Date  . No past surgeries     Family History: family history includes Cancer (age of onset: 41) in her maternal aunt; Cancer (age of onset: 34) in her paternal aunt; Diabetes in her paternal grandmother; and Heart disease in her paternal grandmother. Social History:  reports that she has been smoking Cigarettes.  She has a 4.5 pack-year smoking history. She has never used smokeless tobacco. She reports that she does not drink alcohol or use illicit drugs.   Prenatal Transfer Tool  Maternal Diabetes: No Genetic Screening: Normal Maternal Ultrasounds/Referrals: Normal Fetal Ultrasounds or other Referrals:  None Maternal Substance Abuse:  No Significant Maternal Medications:  None Significant Maternal Lab Results:  Lab values include: Group B Strep negative Other Comments:  None  Review of Systems  Constitutional: Negative for fever,  chills and malaise/fatigue.  Eyes: Negative for blurred vision.  Respiratory: Negative for cough and shortness of breath.   Cardiovascular: Negative for chest pain.  Gastrointestinal: Positive for abdominal pain. Negative for heartburn, nausea and vomiting.  Genitourinary: Negative for dysuria, urgency and frequency.  Musculoskeletal: Negative.   Neurological: Negative for dizziness and headaches.  Psychiatric/Behavioral: Negative for depression.    Dilation: 5 Effacement (%): 100 Station: -3 Exam by:: L. Leftwich-Kirby CNM Blood pressure 129/73, pulse 54, temperature 98 F (36.7 C), temperature source Oral, resp. rate 16, height 5' (1.524 m), weight 78.019 kg (172 lb), last menstrual period 05/31/2012. Maternal Exam:  Uterine Assessment: Contraction strength is moderate.  Contraction duration is 60 seconds. Contraction frequency is regular.   Abdomen: Patient reports no abdominal tenderness. Fetal presentation: vertex  Cervix: Cervix evaluated by digital exam.     Fetal Exam Fetal Monitor Review: Mode: ultrasound.   Baseline rate: 130.  Variability: moderate (6-25 bpm).   Pattern: no accelerations and no decelerations.    Fetal State Assessment: Category I - tracings are normal.     Physical Exam  Nursing note and vitals reviewed. Constitutional: She is oriented to person, place, and time. She appears well-developed and well-nourished.  Neck: Normal range of motion.  Cardiovascular: Normal rate and regular rhythm.   Respiratory: Effort normal and breath sounds normal.  GI: Soft.  Musculoskeletal: Normal range of motion.  Neurological: She is alert and oriented to person, place, and time. She has normal reflexes.  Skin: Skin is  warm and dry.  Psychiatric: She has a normal mood and affect. Her behavior is normal. Judgment and thought content normal.    Prenatal labs: ABO, Rh: --/--/A NEG (04/11 1647) Antibody: NEG (04/11 1647) Rubella: 0.24 (02/19 1429) RPR: NON  REAC (05/07 1135)  HBsAg: NEGATIVE (02/19 1429)  HIV: NON REACTIVE (05/07 1135)  GBS:   Negative on 02/12/13  Assessment/Plan: Active labor at term GBS negative  Admit to [facility] Expectant management Anticipate NSVD   LEFTWICH-KIRBY, Ronda Rajkumar 02/25/2013, 3:21 AM

## 2013-02-25 NOTE — Progress Notes (Signed)
UR completed 

## 2013-02-25 NOTE — Plan of Care (Signed)
Problem: Discharge Progression Outcomes Goal: MMR given as ordered Outcome: Not Met (add Reason) Patient refused MMR

## 2013-02-25 NOTE — Anesthesia Procedure Notes (Signed)
Epidural Patient location during procedure: OB Start time: 02/25/2013 4:40 AM  Staffing Anesthesiologist: Angus Seller., Harrell Gave. Performed by: anesthesiologist   Preanesthetic Checklist Completed: patient identified, site marked, surgical consent, pre-op evaluation, timeout performed, IV checked, risks and benefits discussed and monitors and equipment checked  Epidural Patient position: sitting Prep: site prepped and draped and DuraPrep Patient monitoring: continuous pulse ox and blood pressure Approach: midline Injection technique: LOR air and LOR saline  Needle:  Needle type: Tuohy  Needle gauge: 17 G Needle length: 9 cm and 9 Needle insertion depth: 5 cm cm Catheter type: closed end flexible Catheter size: 19 Gauge Catheter at skin depth: 10 cm Test dose: negative  Assessment Events: blood not aspirated, injection not painful, no injection resistance, negative IV test and no paresthesia  Additional Notes Patient identified.  Risk benefits discussed including failed block, incomplete pain control, headache, nerve damage, paralysis, blood pressure changes, nausea, vomiting, reactions to medication both toxic or allergic, and postpartum back pain.  Patient expressed understanding and wished to proceed.  All questions were answered.  Sterile technique used throughout procedure and epidural site dressed with sterile barrier dressing. No paresthesia or other complications noted.The patient did not experience any signs of intravascular injection such as tinnitus or metallic taste in mouth nor signs of intrathecal spread such as rapid motor block. Please see nursing notes for vital signs.

## 2013-02-25 NOTE — Anesthesia Preprocedure Evaluation (Signed)
Anesthesia Evaluation  Patient identified by MRN, date of birth, ID band Patient awake    Reviewed: Allergy & Precautions, H&P , Patient's Chart, lab work & pertinent test results  Airway Mallampati: III TM Distance: >3 FB Neck ROM: full    Dental no notable dental hx.    Pulmonary neg pulmonary ROS, asthma , Current Smoker,  breath sounds clear to auscultation  Pulmonary exam normal       Cardiovascular negative cardio ROS  Rhythm:regular Rate:Normal     Neuro/Psych negative neurological ROS  negative psych ROS   GI/Hepatic negative GI ROS, Neg liver ROS,   Endo/Other  negative endocrine ROS  Renal/GU negative Renal ROS     Musculoskeletal   Abdominal   Peds  Hematology negative hematology ROS (+)   Anesthesia Other Findings Abscess and cellulitis     Asthma        Tobacco use disorder     UTI (lower urinary tract infection)        Heart murmur    Reproductive/Obstetrics (+) Pregnancy                           Anesthesia Physical Anesthesia Plan  ASA: II  Anesthesia Plan: Epidural   Post-op Pain Management:    Induction:   Airway Management Planned:   Additional Equipment:   Intra-op Plan:   Post-operative Plan:   Informed Consent: I have reviewed the patients History and Physical, chart, labs and discussed the procedure including the risks, benefits and alternatives for the proposed anesthesia with the patient or authorized representative who has indicated his/her understanding and acceptance.     Plan Discussed with:   Anesthesia Plan Comments:         Anesthesia Quick Evaluation

## 2013-02-25 NOTE — Progress Notes (Signed)
Delivery of live viable female by Dr Odom. APGARS 8,9  

## 2013-02-25 NOTE — MAU Note (Signed)
Pt G3 P2 at 37.6wks having contractions every .  Denies bleeding, reports clear discharge.

## 2013-02-25 NOTE — H&P (Signed)
Chart reviewed and agree with management and plan.  

## 2013-02-26 ENCOUNTER — Encounter: Payer: Self-pay | Admitting: Obstetrics & Gynecology

## 2013-02-26 ENCOUNTER — Encounter: Payer: Medicaid Other | Admitting: Family Medicine

## 2013-02-26 MED ORDER — ACETAMINOPHEN 325 MG PO TABS: 650.0000 mg | ORAL_TABLET | Freq: Four times a day (QID) | ORAL | Status: DC | PRN

## 2013-02-26 NOTE — Anesthesia Postprocedure Evaluation (Signed)
  Anesthesia Post Note  Patient: Eileen Tate  Procedure(s) Performed: * No procedures listed *  Anesthesia type: Epidural  Patient location: Mother/Baby  Post pain: Pain level controlled  Post assessment: Post-op Vital signs reviewed  Last Vitals:  Filed Vitals:   02/25/13 2230  BP: 116/73  Pulse: 59  Temp: 37.1 C  Resp: 18    Post vital signs: Reviewed  Level of consciousness: awake  Complications: No apparent anesthesia complications

## 2013-02-26 NOTE — Discharge Summary (Signed)
Obstetric Discharge Summary Reason for Admission: onset of labor Prenatal Procedures: none Intrapartum Procedures: spontaneous vaginal delivery Postpartum Procedures: none Complications-Operative and Postpartum: none Hemoglobin  Date Value Range Status  02/25/2013 12.5  12.0 - 15.0 g/dL Final  09/13/863 78.4  12.2 - 16.2 g/dL Final     HCT  Date Value Range Status  02/25/2013 35.4* 36.0 - 46.0 % Final    Physical Exam:  General: alert, cooperative and no distress Heart: RRR Lungs: nl effort Lochia: appropriate Uterine Fundus: firm DVT Evaluation: No evidence of DVT seen on physical exam.  Discharge Diagnoses: Term Pregnancy-delivered  Discharge Information: Date: 02/26/2013 Activity: pelvic rest Diet: routine Medications: PNV and Tylenol and Tramadol prn Condition: stable Instructions: refer to practice specific booklet Discharge to: home Follow-up Information   Follow up with [facility]. (You will be contacted for a pre-op visit in order to schedule your tubal ligation.)    Contact information:   [address] [place] [address] [phone]      Newborn Data: Live born female  Birth Weight: 5 lb 9 oz (2523 g) APGAR: 8, 9  Home with mother. Bottlefeeding. Plans interval BTL.  SHAW, KIMBERLY 02/26/2013, 9:15 AM

## 2013-02-26 NOTE — Anesthesia Postprocedure Evaluation (Signed)
  Anesthesia Post-op Note  Anesthesia Post Note  Patient: Eileen Tate  Procedure(s) Performed: * No procedures listed *  Anesthesia type: Epidural  Patient location: Mother/Baby  Post pain: Pain level controlled  Post assessment: Post-op Vital signs reviewed  Last Vitals:  Filed Vitals:   02/26/13 0615  BP: 116/68  Pulse: 60  Temp: 37.1 C  Resp: 18    Post vital signs: Reviewed  Level of consciousness:alert  Complications: No apparent anesthesia complications

## 2013-02-26 NOTE — Plan of Care (Signed)
Problem: Discharge Progression Outcomes Goal: MMR given as ordered Outcome: Not Applicable Date Met:  02/26/13 Patient refused

## 2013-02-27 ENCOUNTER — Encounter: Payer: Self-pay | Admitting: Cardiology

## 2013-03-03 NOTE — Discharge Summary (Signed)
Attestation of Attending Supervision of Advanced Practitioner (CNM/NP): Evaluation and management procedures were performed by the Advanced Practitioner under my supervision and collaboration.  I have reviewed the Advanced Practitioner's note and chart, and I agree with the management and plan.  Lamberto Dinapoli 03/03/2013 12:20 PM   

## 2013-04-01 ENCOUNTER — Encounter: Payer: Self-pay | Admitting: *Deleted

## 2013-04-03 ENCOUNTER — Ambulatory Visit: Payer: Medicaid Other | Admitting: Obstetrics & Gynecology

## 2013-04-16 ENCOUNTER — Encounter: Payer: Self-pay | Admitting: *Deleted

## 2013-07-29 ENCOUNTER — Emergency Department (INDEPENDENT_AMBULATORY_CARE_PROVIDER_SITE_OTHER)
Admission: EM | Admit: 2013-07-29 | Discharge: 2013-07-29 | Disposition: A | Discharge status: Home / Self Care | Payer: Medicaid Other | Source: Home / Self Care | Attending: Family Medicine | Admitting: Family Medicine

## 2013-07-29 ENCOUNTER — Encounter (HOSPITAL_COMMUNITY): Payer: Self-pay | Admitting: Emergency Medicine

## 2013-07-29 ENCOUNTER — Emergency Department (INDEPENDENT_AMBULATORY_CARE_PROVIDER_SITE_OTHER): Payer: Medicaid Other

## 2013-07-29 DIAGNOSIS — J40 Bronchitis, not specified as acute or chronic: Secondary | ICD-10-CM

## 2013-07-29 MED ORDER — AZITHROMYCIN 250 MG PO TABS: ORAL_TABLET | ORAL | Status: DC

## 2013-07-29 MED ORDER — AZITHROMYCIN 250 MG PO TABS
ORAL_TABLET | ORAL | Status: AC
  Filled 2013-07-29: qty 2

## 2013-07-29 MED ORDER — HYDROCODONE-ACETAMINOPHEN 5-325 MG PO TABS: 1.0000 | ORAL_TABLET | Freq: Four times a day (QID) | ORAL | Status: DC | PRN

## 2013-07-29 MED ORDER — HYDROCODONE-ACETAMINOPHEN 5-325 MG PO TABS
ORAL_TABLET | ORAL | Status: AC
  Filled 2013-07-29: qty 1

## 2013-07-29 MED ORDER — AZITHROMYCIN 250 MG PO TABS
500.0000 mg | ORAL_TABLET | Freq: Once | ORAL | Status: AC
  Administered 2013-07-29: 500 mg via ORAL

## 2013-07-29 MED ORDER — PREDNISONE 10 MG PO TABS: 30.0000 mg | ORAL_TABLET | Freq: Every day | ORAL | Status: DC

## 2013-07-29 MED ORDER — PREDNISONE 20 MG PO TABS
40.0000 mg | ORAL_TABLET | Freq: Once | ORAL | Status: AC
  Administered 2013-07-29: 40 mg via ORAL

## 2013-07-29 MED ORDER — PREDNISONE 20 MG PO TABS
ORAL_TABLET | ORAL | Status: AC
  Filled 2013-07-29: qty 2

## 2013-07-29 MED ORDER — HYDROCODONE-ACETAMINOPHEN 5-325 MG PO TABS
1.0000 | ORAL_TABLET | Freq: Once | ORAL | Status: AC
  Administered 2013-07-29: 1 via ORAL

## 2013-07-29 NOTE — ED Notes (Signed)
Cough, congestion x 1 week; has 4 minor children at home

## 2013-07-29 NOTE — ED Provider Notes (Signed)
Eileen Tate is a 27 y.o. female who presents to Urgent Care today for cough congestion and pleuritic chest pain with deep inspiration. This is been present for one week. Patient notes back rehabilitation and chest pain. This is present with deep inspiration and coughing. It is moderate. She denies any central or exertional chest pain. She feels well otherwise. She denies any significant shortness of breath nausea vomiting or diarrhea. She is taking Aleve which does not help much.   Past Medical History  Diagnosis Date  . Abscess and cellulitis   . Asthma   . Tobacco use disorder   . UTI (lower urinary tract infection)   . Heart murmur    History  Substance Use Topics  . Smoking status: Current Some Day Smoker -- 0.50 packs/day for 9 years    Types: Cigarettes  . Smokeless tobacco: Never Used  . Alcohol Use: No     Comment: occasion   ROS as above Medications reviewed. No current facility-administered medications for this encounter.   Current Outpatient Prescriptions  Medication Sig Dispense Refill  . acetaminophen (TYLENOL) 325 MG tablet Take 2 tablets (650 mg total) by mouth every 6 (six) hours as needed.      Marland Kitchen azithromycin (ZITHROMAX) 250 MG tablet Take 1 tablet every day until finished.  4 tablet  0  . HYDROcodone-acetaminophen (NORCO/VICODIN) 5-325 MG per tablet Take 1 tablet by mouth every 6 (six) hours as needed.  15 tablet  0  . levalbuterol (XOPENEX HFA) 45 MCG/ACT inhaler Inhale 1 puff into the lungs daily as needed. For shortness of breath      . predniSONE (DELTASONE) 10 MG tablet Take 3 tablets (30 mg total) by mouth daily.  15 tablet  0  . Prenatal Vit-Fe Fumarate-FA (PRENATAL MULTIVITAMIN) TABS Take 1 tablet by mouth at bedtime.        Exam:  BP 123/78  Pulse 67  Temp(Src) 98.8 F (37.1 C) (Oral)  Resp 18  SpO2 97%  LMP 06/29/2013 Gen: Well NAD HEENT: EOMI,  MMM posterior pharynx with cobblestoning. Tympanic membranes are normal appearing  bilaterally Lungs: Normal work of breathing. CTABL,   Heart: RRR no murmurs rubs or gallops.  Abd: NABS, Soft. NT, ND Exts: Non edematous BL  LE, warm and well perfused.   No results found for this or any previous visit (from the past 24 hour(s)). Dg Chest 2 View  07/29/2013   CLINICAL DATA:  Cough, congestion, smoker.  EXAM: CHEST  2 VIEW  COMPARISON:  04/09/2012  FINDINGS: Mild peribronchial thickening. Heart and mediastinal contours are within normal limits. No focal opacities or effusions. No acute bony abnormality.  IMPRESSION: Mild bronchitic changes.   Electronically Signed   By: Charlett Nose M.D.   On: 07/29/2013 18:19    Assessment and Plan: 27 y.o. female with bronchitis. Plan to treat with azithromycin, prednisone, and hydrocodone for pain and cough medication. Will give first dose today. Recommend followup to the emergency room if worsening. Discussed warning signs or symptoms. Please see discharge instructions. Patient expresses understanding.      Rodolph Bong, MD 07/29/13 [phone]

## 2013-09-14 ENCOUNTER — Emergency Department (INDEPENDENT_AMBULATORY_CARE_PROVIDER_SITE_OTHER)
Admission: EM | Admit: 2013-09-14 | Discharge: 2013-09-14 | Disposition: A | Discharge status: Home / Self Care | Payer: Medicaid Other | Source: Home / Self Care

## 2013-09-14 ENCOUNTER — Encounter (HOSPITAL_COMMUNITY): Payer: Self-pay | Admitting: Emergency Medicine

## 2013-09-14 ENCOUNTER — Emergency Department (INDEPENDENT_AMBULATORY_CARE_PROVIDER_SITE_OTHER): Payer: Medicaid Other

## 2013-09-14 DIAGNOSIS — M791 Myalgia, unspecified site: Secondary | ICD-10-CM

## 2013-09-14 DIAGNOSIS — M25561 Pain in right knee: Secondary | ICD-10-CM

## 2013-09-14 DIAGNOSIS — M25569 Pain in unspecified knee: Secondary | ICD-10-CM

## 2013-09-14 DIAGNOSIS — IMO0001 Reserved for inherently not codable concepts without codable children: Secondary | ICD-10-CM

## 2013-09-14 MED ORDER — CYCLOBENZAPRINE HCL 5 MG PO TABS: 5.0000 mg | ORAL_TABLET | Freq: Three times a day (TID) | ORAL | Status: DC | PRN

## 2013-09-14 MED ORDER — PREDNISONE 5 MG PO TABS: ORAL_TABLET | ORAL | Status: AC

## 2013-09-14 NOTE — Discharge Instructions (Signed)
Musculoskeletal Pain Musculoskeletal pain is muscle and boney aches and pains. These pains can occur in any part of the body. Your caregiver may treat you without knowing the cause of the pain. They may treat you if blood or urine tests, X-rays, and other tests were normal.  CAUSES There is often not a definite cause or reason for these pains. These pains may be caused by a type of germ (virus). The discomfort may also come from overuse. Overuse includes working out too hard when your body is not fit. Boney aches also come from weather changes. Bone is sensitive to atmospheric pressure changes. HOME CARE INSTRUCTIONS   Ask when your test results will be ready. Make sure you get your test results.  Only take over-the-counter or prescription medicines for pain, discomfort, or fever as directed by your caregiver. If you were given medications for your condition, do not drive, operate machinery or power tools, or sign legal documents for 24 hours. Do not drink alcohol. Do not take sleeping pills or other medications that may interfere with treatment.  Continue all activities unless the activities cause more pain. When the pain lessens, slowly resume normal activities. Gradually increase the intensity and duration of the activities or exercise.  During periods of severe pain, bed rest may be helpful. Lay or sit in any position that is comfortable.  Putting ice on the injured area.  Put ice in a bag.  Place a towel between your skin and the bag.  Leave the ice on for 15 to 20 minutes, 3 to 4 times a day.  Follow up with your caregiver for continued problems and no reason can be found for the pain. If the pain becomes worse or does not go away, it may be necessary to repeat tests or do additional testing. Your caregiver may need to look further for a possible cause. SEEK IMMEDIATE MEDICAL CARE IF:  You have pain that is getting worse and is not relieved by medications.  You develop chest pain  that is associated with shortness or breath, sweating, feeling sick to your stomach (nauseous), or throw up (vomit).  Your pain becomes localized to the abdomen.  You develop any new symptoms that seem different or that concern you. MAKE SURE YOU:   Understand these instructions.  Will watch your condition.  Will get help right away if you are not doing well or get worse. Document Released: 07/17/2005 Document Revised: 10/09/2011 Document Reviewed: 03/21/2013 [facility] Patient Information 2014 [place], [place].  Myofascial Pain Syndrome  Myofascial pain (MP) is one of the most common causes of discomfort. This pain may be felt in the muscles. It may come and go. MP always has trigger or tender points in the muscle that will cause pain when pressed. HOME CARE   Learn to do gentle exercise. Stretch.  Change your position every hour.  Eat a healthy diet.  Find ways to relax and lessen stress.  Avoid things that make your pain worse.  Get a massage by someone trained to release trigger or tender points.  Only take medicine as told by your doctor.  Get follow-up care as needed. GET HELP RIGHT AWAY IF:   Your pain suddenly gets worse.  You are not able to move your arms or legs.  You have pain in your chest.  It is hard to breathe. MAKE SURE YOU:  Understand these instructions.  Will watch your condition.  Will get help right away if you are not doing well or  get worse. Document Released: 06/29/2008 Document Revised: 10/09/2011 Document Reviewed: 06/29/2008 [facility] Patient Information 2014 [place], [place].   Take full Prednisone Pack, this is for inflammation and pain. May use the flexeril as needed for muscle pain. F/U if worsens.

## 2013-09-14 NOTE — ED Provider Notes (Signed)
Medical screening examination/treatment/procedure(s) were performed by a resident physician or non-physician practitioner and as the supervising physician I was immediately available for consultation/collaboration.  Irineo Gaulin, MD   Donyetta Ogletree S Gizzelle Lacomb, MD 09/14/13 1941 

## 2013-09-14 NOTE — ED Notes (Signed)
Pt  Has  l  Knee  Pain  X  Several  Days    She  denys  Any  specefic  Injury   She  Ambulated  To  Room  With a  Slow steady  Gait          denys  Any  Recent travel  denys  Any  Chest pain or  Any  Shortness  Of  Breath

## 2013-09-14 NOTE — ED Provider Notes (Signed)
CSN: [id number]     Arrival date & time 09/14/13  1806 History   None    Chief Complaint  Patient presents with  . Knee Pain     (Consider location/radiation/quality/duration/timing/severity/associated sxs/prior Treatment) HPI Comments: Patient presents with an acute onset of left knee pain x 2 days. Pain with ambulating or at rest. No swelling, erythema or warmth. No known injury. No calf, hip or back pain is noted. No decrease in ROM.   Patient is a 28 y.o. female presenting with knee pain. The history is provided by the patient.  Knee Pain   Past Medical History  Diagnosis Date  . Abscess and cellulitis   . Asthma   . Tobacco use disorder   . UTI (lower urinary tract infection)   . Heart murmur    Past Surgical History  Procedure Laterality Date  . No past surgeries     Family History  Problem Relation Age of Onset  . Cancer Maternal Aunt 19    breast  . Cancer Paternal Aunt [id number]    breast  . Diabetes Paternal Grandmother   . Heart disease Paternal Grandmother    History  Substance Use Topics  . Smoking status: Current Some Day Smoker -- 0.50 packs/day for 9 years    Types: Cigarettes  . Smokeless tobacco: Never Used  . Alcohol Use: No     Comment: occasion   OB History   Grav Para Term Preterm Abortions TAB SAB Ect Mult Living   3 3 3       3      Review of Systems  All other systems reviewed and are negative.      Allergies  Ibuprofen  Home Medications   Current Outpatient Rx  Name  Route  Sig  Dispense  Refill  . acetaminophen (TYLENOL) 325 MG tablet   Oral   Take 2 tablets (650 mg total) by mouth every 6 (six) hours as needed.         Marland Kitchen. azithromycin (ZITHROMAX) 250 MG tablet      Take 1 tablet every day until finished.   4 tablet   0   . cyclobenzaprine (FLEXERIL) 5 MG tablet   Oral   Take 1 tablet (5 mg total) by mouth 3 (three) times daily as needed for muscle spasms.   20 tablet   0   . HYDROcodone-acetaminophen (NORCO/VICODIN)  5-325 MG per tablet   Oral   Take 1 tablet by mouth every 6 (six) hours as needed.   15 tablet   0   . levalbuterol (XOPENEX HFA) 45 MCG/ACT inhaler   Inhalation   Inhale 1 puff into the lungs daily as needed. For shortness of breath         . predniSONE (DELTASONE) 10 MG tablet   Oral   Take 3 tablets (30 mg total) by mouth daily.   15 tablet   0   . predniSONE (DELTASONE) 5 MG tablet      5mg  6 day Sterapred Dosepack   1 tablet   0   . Prenatal Vit-Fe Fumarate-FA (PRENATAL MULTIVITAMIN) TABS   Oral   Take 1 tablet by mouth at bedtime.          BP 114/71  Pulse 98  Temp(Src) 99.5 F (37.5 C) (Oral)  Resp 16  SpO2 100%  LMP 08/31/2013 Physical Exam  Constitutional: She is oriented to person, place, and time. She appears well-developed and well-nourished. No distress.  Musculoskeletal: Normal range of  motion. She exhibits tenderness. She exhibits no edema.  Left knee is cool to touch, no effusions, full ROM. Pain to light palpation along the knee and upper thigh. Full ROM  the hip and back.   Neurological: She is alert and oriented to person, place, and time.  Skin: Skin is warm and dry. No rash noted. She is not diaphoretic. No erythema.  Psychiatric: Her behavior is normal.    ED Course  Procedures (including critical care time) Labs Review Labs Reviewed - No data to display Imaging Review Dg Knee Complete 4 Views Left  09/14/2013   CLINICAL DATA:  Left knee pain without injury.  EXAM: LEFT KNEE - COMPLETE 4+ VIEW  COMPARISON:  None.  FINDINGS: There is no evidence of fracture, dislocation, or joint effusion. There is no evidence of arthropathy or other focal bone abnormality. Soft tissues are unremarkable.  IMPRESSION: Normal left knee.   Electronically Signed   By: Roque Lias M.D.   On: 09/14/2013 19:02      MDM   Final diagnoses:  Myalgia  Knee pain, right    No evidence of knee trauma, no evidence of infection, no signs of a DVT is noted.  Probable myalgias- Reassurance given. Will try a Pred pack and Flexeril as needed.     Riki Sheer, PA-C 09/14/13 [place]ina [place]

## 2013-10-27 ENCOUNTER — Emergency Department: Payer: Self-pay | Admitting: Emergency Medicine

## 2013-10-27 LAB — CBC WITH DIFFERENTIAL/PLATELET
Basophil #: 0.1 10*3/uL (ref 0.0–0.1)
Basophil %: 0.7 %
EOS ABS: 0.1 10*3/uL (ref 0.0–0.7)
EOS PCT: 1 %
HCT: 43.6 % (ref 35.0–47.0)
HGB: 14.4 g/dL (ref 12.0–16.0)
Lymphocyte #: 2.3 10*3/uL (ref 1.0–3.6)
Lymphocyte %: 21.7 %
MCH: 29.8 pg (ref 26.0–34.0)
MCHC: 33 g/dL (ref 32.0–36.0)
MCV: 90 fL (ref 80–100)
MONO ABS: 0.6 x10 3/mm (ref 0.2–0.9)
MONOS PCT: 5.4 %
NEUTROS PCT: 71.2 %
Neutrophil #: 7.4 10*3/uL — ABNORMAL HIGH (ref 1.4–6.5)
Platelet: 259 10*3/uL (ref 150–440)
RBC: 4.82 10*6/uL (ref 3.80–5.20)
RDW: 13.6 % (ref 11.5–14.5)
WBC: 10.4 10*3/uL (ref 3.6–11.0)

## 2013-10-27 LAB — URINALYSIS, COMPLETE
BLOOD: NEGATIVE
Bilirubin,UR: NEGATIVE
Glucose,UR: NEGATIVE mg/dL (ref 0–75)
Leukocyte Esterase: NEGATIVE
NITRITE: NEGATIVE
PH: 6 (ref 4.5–8.0)
Protein: 30
Specific Gravity: 1.023 (ref 1.003–1.030)

## 2013-10-27 LAB — COMPREHENSIVE METABOLIC PANEL
ALBUMIN: 3.9 g/dL (ref 3.4–5.0)
ANION GAP: 3 — AB (ref 7–16)
Alkaline Phosphatase: 90 U/L
BUN: 7 mg/dL (ref 7–18)
Bilirubin,Total: 0.5 mg/dL (ref 0.2–1.0)
CO2: 26 mmol/L (ref 21–32)
Calcium, Total: 9.1 mg/dL (ref 8.5–10.1)
Chloride: 105 mmol/L (ref 98–107)
Creatinine: 0.69 mg/dL (ref 0.60–1.30)
EGFR (African American): >60
Glucose: 74 mg/dL (ref 65–99)
OSMOLALITY: 265 (ref 275–301)
Potassium: 3.8 mmol/L (ref 3.5–5.1)
SGOT(AST): 26 U/L (ref 15–37)
SGPT (ALT): 24 U/L (ref 12–78)
Sodium: 134 mmol/L — ABNORMAL LOW (ref 136–145)
Total Protein: 8 g/dL (ref 6.4–8.2)

## 2013-10-27 LAB — HCG, QUANTITATIVE, PREGNANCY: BETA HCG, QUANT.: 36062 m[IU]/mL — AB

## 2013-10-27 LAB — LIPASE, BLOOD: Lipase: 87 U/L (ref 73–393)

## 2013-11-16 ENCOUNTER — Emergency Department: Payer: Self-pay | Admitting: Emergency Medicine

## 2013-11-16 LAB — CBC WITH DIFFERENTIAL/PLATELET
Basophil #: 0.1 10*3/uL (ref 0.0–0.1)
Basophil %: 0.5 %
EOS ABS: 0.1 10*3/uL (ref 0.0–0.7)
EOS PCT: 0.9 %
HCT: 40.5 % (ref 35.0–47.0)
HGB: 13.3 g/dL (ref 12.0–16.0)
LYMPHS ABS: 1.6 10*3/uL (ref 1.0–3.6)
LYMPHS PCT: 12.6 %
MCH: 29.3 pg (ref 26.0–34.0)
MCHC: 32.8 g/dL (ref 32.0–36.0)
MCV: 89 fL (ref 80–100)
MONOS PCT: 4.5 %
Monocyte #: 0.6 x10 3/mm (ref 0.2–0.9)
Neutrophil #: 10.6 10*3/uL — ABNORMAL HIGH (ref 1.4–6.5)
Neutrophil %: 81.5 %
Platelet: 243 10*3/uL (ref 150–440)
RBC: 4.54 10*6/uL (ref 3.80–5.20)
RDW: 13.6 % (ref 11.5–14.5)
WBC: 12.9 10*3/uL — ABNORMAL HIGH (ref 3.6–11.0)

## 2013-11-16 LAB — COMPREHENSIVE METABOLIC PANEL
ANION GAP: 7 (ref 7–16)
Albumin: 3.3 g/dL — ABNORMAL LOW (ref 3.4–5.0)
Alkaline Phosphatase: 66 U/L
BUN: 7 mg/dL (ref 7–18)
Bilirubin,Total: 0.2 mg/dL (ref 0.2–1.0)
CHLORIDE: 103 mmol/L (ref 98–107)
CO2: 23 mmol/L (ref 21–32)
CREATININE: 0.5 mg/dL — AB (ref 0.60–1.30)
Calcium, Total: 8.6 mg/dL (ref 8.5–10.1)
EGFR (African American): >60
GLUCOSE: 82 mg/dL (ref 65–99)
Osmolality: 263 (ref 275–301)
Potassium: 3.4 mmol/L — ABNORMAL LOW (ref 3.5–5.1)
SGOT(AST): 25 U/L (ref 15–37)
SGPT (ALT): 19 U/L (ref 12–78)
Sodium: 133 mmol/L — ABNORMAL LOW (ref 136–145)
Total Protein: 7.1 g/dL (ref 6.4–8.2)

## 2014-02-09 ENCOUNTER — Encounter: Payer: Self-pay | Admitting: Maternal & Fetal Medicine

## 2014-03-10 ENCOUNTER — Observation Stay: Payer: Self-pay | Admitting: Obstetrics and Gynecology

## 2014-03-10 LAB — URINALYSIS, COMPLETE
Bilirubin,UR: NEGATIVE
Blood: NEGATIVE
Glucose,UR: NEGATIVE mg/dL
Ketone: NEGATIVE
Leukocyte Esterase: NEGATIVE
Nitrite: NEGATIVE
Ph: 7
Protein: NEGATIVE
RBC,UR: NONE SEEN /HPF
Specific Gravity: 1.001
Squamous Epithelial: 6
WBC UR: 2 /HPF

## 2014-03-29 ENCOUNTER — Observation Stay: Payer: Self-pay

## 2014-03-30 LAB — URINALYSIS, COMPLETE
BLOOD: NEGATIVE
Bilirubin,UR: NEGATIVE
GLUCOSE, UR: NEGATIVE mg/dL (ref 0–75)
Ketone: NEGATIVE
Leukocyte Esterase: NEGATIVE
NITRITE: NEGATIVE
PH: 8 (ref 4.5–8.0)
PROTEIN: NEGATIVE
RBC,UR: 1 /HPF (ref 0–5)
Specific Gravity: 1.01 (ref 1.003–1.030)
Squamous Epithelial: 6

## 2014-03-30 LAB — GC/CHLAMYDIA PROBE AMP

## 2014-03-31 LAB — URINE CULTURE

## 2014-04-17 ENCOUNTER — Observation Stay: Payer: Self-pay | Admitting: Obstetrics and Gynecology

## 2014-04-17 LAB — URINALYSIS, COMPLETE
BLOOD: NEGATIVE
Bilirubin,UR: NEGATIVE
GLUCOSE, UR: NEGATIVE mg/dL (ref 0–75)
KETONE: NEGATIVE
LEUKOCYTE ESTERASE: NEGATIVE
NITRITE: NEGATIVE
PH: 7 (ref 4.5–8.0)
Protein: NEGATIVE
RBC, UR: NONE SEEN /HPF (ref 0–5)
Specific Gravity: 1.004 (ref 1.003–1.030)
Squamous Epithelial: 3

## 2014-04-18 LAB — WET PREP, GENITAL

## 2014-05-17 ENCOUNTER — Encounter (HOSPITAL_COMMUNITY): Payer: Self-pay | Admitting: *Deleted

## 2014-05-17 ENCOUNTER — Inpatient Hospital Stay (HOSPITAL_COMMUNITY)
Admission: AD | Admit: 2014-05-17 | Discharge: 2014-05-18 | Disposition: A | Discharge status: Home / Self Care | Payer: Medicaid Other | Source: Ambulatory Visit | Attending: Obstetrics & Gynecology | Admitting: Obstetrics & Gynecology

## 2014-05-17 DIAGNOSIS — R109 Unspecified abdominal pain: Secondary | ICD-10-CM | POA: Insufficient documentation

## 2014-05-17 DIAGNOSIS — N949 Unspecified condition associated with female genital organs and menstrual cycle: Secondary | ICD-10-CM | POA: Diagnosis not present

## 2014-05-17 DIAGNOSIS — O99333 Smoking (tobacco) complicating pregnancy, third trimester: Secondary | ICD-10-CM | POA: Diagnosis not present

## 2014-05-17 DIAGNOSIS — O9989 Other specified diseases and conditions complicating pregnancy, childbirth and the puerperium: Secondary | ICD-10-CM | POA: Diagnosis not present

## 2014-05-17 DIAGNOSIS — F17210 Nicotine dependence, cigarettes, uncomplicated: Secondary | ICD-10-CM | POA: Diagnosis not present

## 2014-05-17 DIAGNOSIS — Z3A35 35 weeks gestation of pregnancy: Secondary | ICD-10-CM | POA: Diagnosis not present

## 2014-05-17 LAB — URINALYSIS, ROUTINE W REFLEX MICROSCOPIC
BILIRUBIN URINE: NEGATIVE
Glucose, UA: NEGATIVE mg/dL
HGB URINE DIPSTICK: NEGATIVE
Ketones, ur: NEGATIVE mg/dL
Leukocytes, UA: NEGATIVE
Nitrite: NEGATIVE
Protein, ur: NEGATIVE mg/dL
SPECIFIC GRAVITY, URINE: 1.01 (ref 1.005–1.030)
UROBILINOGEN UA: 0.2 mg/dL (ref 0.0–1.0)
pH: 6.5 (ref 5.0–8.0)

## 2014-05-17 LAB — POCT FERN TEST: POCT Fern Test: NEGATIVE

## 2014-05-17 LAB — POCT PREGNANCY, URINE: PREG TEST UR: POSITIVE — AB

## 2014-05-17 LAB — WET PREP, GENITAL
CLUE CELLS WET PREP: NONE SEEN
TRICH WET PREP: NONE SEEN
WBC WET PREP: NONE SEEN
Yeast Wet Prep HPF POC: NONE SEEN

## 2014-05-17 NOTE — MAU Note (Signed)
Pt reports lower abd pain for the last 6 hours, off/on. Also reports watery discharge since 6 pm.

## 2014-05-17 NOTE — MAU Provider Note (Signed)
History     CSN: [id number]  Arrival date and time: 05/17/14 2136   First Provider Initiated Contact with Patient 05/17/14 2249      Chief Complaint  Patient presents with  . Rupture of Membranes  . Abdominal Pain   HPI  Eileen Tate is a 28 y.o. G4P3003 at 3243w6d by LMp who presents with mild intermittent abdominal and pelvic pain. Pain mostly located in groin area and around umbilicus. Pain is described as sharp. Also with watery vaginal discharge since around 3:30pm. Reports that she has had to change her pants twice. Discharge started yesterday and is clear with no malodor. No nausea or vomiting. No fevers or chills. No dysuria.  Denies gushes of fluid or vaginal bleeding. Denies contractions. Endorses good fetal movement.     OB History   Grav Para Term Preterm Abortions TAB SAB Ect Mult Living   4 3 3       3       Past Medical History  Diagnosis Date  . Abscess and cellulitis   . Asthma   . Tobacco use disorder   . UTI (lower urinary tract infection)   . Heart murmur     Past Surgical History  Procedure Laterality Date  . No past surgeries      Family History  Problem Relation Age of Onset  . Cancer Maternal Aunt 19    breast  . Cancer Paternal Aunt 3147    breast  . Diabetes Paternal Grandmother   . Heart disease Paternal Grandmother     History  Substance Use Topics  . Smoking status: Current Some Day Smoker -- 0.50 packs/day for 9 years    Types: Cigarettes  . Smokeless tobacco: Never Used  . Alcohol Use: No     Comment: occasion    Allergies:  Allergies  Allergen Reactions  . Ibuprofen Other (See Comments)    Causes blisters    Prescriptions prior to admission  Medication Sig Dispense Refill  . acetaminophen (TYLENOL) 325 MG tablet Take 2 tablets (650 mg total) by mouth every 6 (six) hours as needed.      Marland Kitchen. azithromycin (ZITHROMAX) 250 MG tablet Take 1 tablet every day until finished.  4 tablet  0  . cyclobenzaprine (FLEXERIL) 5  MG tablet Take 1 tablet (5 mg total) by mouth 3 (three) times daily as needed for muscle spasms.  20 tablet  0  . HYDROcodone-acetaminophen (NORCO/VICODIN) 5-325 MG per tablet Take 1 tablet by mouth every 6 (six) hours as needed.  15 tablet  0  . levalbuterol (XOPENEX HFA) 45 MCG/ACT inhaler Inhale 1 puff into the lungs daily as needed. For shortness of breath      . predniSONE (DELTASONE) 10 MG tablet Take 3 tablets (30 mg total) by mouth daily.  15 tablet  0  . Prenatal Vit-Fe Fumarate-FA (PRENATAL MULTIVITAMIN) TABS Take 1 tablet by mouth at bedtime.        Review of Systems  All other systems reviewed and are negative.  Physical Exam   Blood pressure 111/76, pulse 77, temperature 97.9 F (36.6 C), temperature source Oral, resp. rate 16, height 5' (1.524 m), weight 74.844 kg (165 lb), last menstrual period 08/31/2013, SpO2 99.00%.  Physical Exam  Nursing note and vitals reviewed. Constitutional: She is oriented to person, place, and time. She appears well-developed and well-nourished. No distress.  HENT:  Head: Normocephalic.  Eyes: EOM are normal.  Neck: Normal range of motion.  Cardiovascular: Normal rate, regular  rhythm and normal heart sounds.   No murmur heard. Respiratory: Effort normal. No respiratory distress. She has no wheezes.  GI: Soft. There is no tenderness.  Uterine size appropriate for gestational age  Musculoskeletal: Normal range of motion. She exhibits no edema.  Neurological: She is alert and oriented to person, place, and time.  Skin: Skin is warm and dry.  SSE: No pooling. No lesions. Scant amount of white discharge noted.  FHT:  FHR: 135 bpm, variability: moderate,  accelerations:  present,  decelerations:  none  Procedures-None  Results for orders placed during the hospital encounter of 05/17/14 (from the past 24 hour(s))  URINALYSIS, ROUTINE W REFLEX MICROSCOPIC     Status: None   Collection Time    05/17/14  9:48 PM      Result Value Ref Range    Color, Urine YELLOW  YELLOW   APPearance CLEAR  CLEAR   Specific Gravity, Urine 1.010  1.005 - 1.030   pH 6.5  5.0 - 8.0   Glucose, UA NEGATIVE  NEGATIVE mg/dL   Hgb urine dipstick NEGATIVE  NEGATIVE   Bilirubin Urine NEGATIVE  NEGATIVE   Ketones, ur NEGATIVE  NEGATIVE mg/dL   Protein, ur NEGATIVE  NEGATIVE mg/dL   Urobilinogen, UA 0.2  0.0 - 1.0 mg/dL   Nitrite NEGATIVE  NEGATIVE   Leukocytes, UA NEGATIVE  NEGATIVE  POCT PREGNANCY, URINE     Status: Abnormal   Collection Time    05/17/14  9:57 PM      Result Value Ref Range   Preg Test, Ur POSITIVE (*) NEGATIVE  POCT FERN TEST     Status: None   Collection Time    05/17/14 10:13 PM      Result Value Ref Range   POCT Fern Test Negative = intact amniotic membranes    WET PREP, GENITAL     Status: None   Collection Time    05/17/14 10:59 PM      Result Value Ref Range   Yeast Wet Prep HPF POC NONE SEEN  NONE SEEN   Trich, Wet Prep NONE SEEN  NONE SEEN   Clue Cells Wet Prep HPF POC NONE SEEN  NONE SEEN   WBC, Wet Prep HPF POC NONE SEEN  NONE SEEN   Assessment and Plan  A: 933w6d IUP, FHT category 1. UA, wet prep, fern test unremarkable. Likely urinary incontinence and round ligament pain.   P: Discharge home in stable condition Labor precautions and kickcounts reviewed.   Jacquiline Doearker, Larina Lieurance 05/17/2014, 11:16 PM

## 2014-05-18 NOTE — MAU Provider Note (Signed)
Agree with assessment. 

## 2014-05-18 NOTE — Discharge Instructions (Signed)
Third Trimester of Pregnancy The third trimester is from week 29 through week 42, months 7 through 9. The third trimester is a time when the fetus is growing rapidly. At the end of the ninth month, the fetus is about 20 inches in length and weighs 6-10 pounds.  BODY CHANGES Your body goes through many changes during pregnancy. The changes vary from woman to woman.   Your weight will continue to increase. You can expect to gain 25-35 pounds (11-16 kg) by the end of the pregnancy.  You may begin to get stretch marks on your hips, abdomen, and breasts.  You may urinate more often because the fetus is moving lower into your pelvis and pressing on your bladder.  You may develop or continue to have heartburn as a result of your pregnancy.  You may develop constipation because certain hormones are causing the muscles that push waste through your intestines to slow down.  You may develop hemorrhoids or swollen, bulging veins (varicose veins).  You may have pelvic pain because of the weight gain and pregnancy hormones relaxing your joints between the bones in your pelvis. Backaches may result from overexertion of the muscles supporting your posture.  You may have changes in your hair. These can include thickening of your hair, rapid growth, and changes in texture. Some women also have hair loss during or after pregnancy, or hair that feels dry or thin. Your hair will most likely return to normal after your baby is born.  Your breasts will continue to grow and be tender. A yellow discharge may leak from your breasts called colostrum.  Your belly button may stick out.  You may feel short of breath because of your expanding uterus.  You may notice the fetus "dropping," or moving lower in your abdomen.  You may have a bloody mucus discharge. This usually occurs a few days to a week before labor begins.  Your cervix becomes thin and soft (effaced) near your due date. WHAT TO EXPECT AT YOUR PRENATAL  EXAMS  You will have prenatal exams every 2 weeks until week 36. Then, you will have weekly prenatal exams. During a routine prenatal visit:  You will be weighed to make sure you and the fetus are growing normally.  Your blood pressure is taken.  Your abdomen will be measured to track your baby's growth.  The fetal heartbeat will be listened to.  Any test results from the previous visit will be discussed.  You may have a cervical check near your due date to see if you have effaced. At around 36 weeks, your caregiver will check your cervix. At the same time, your caregiver will also perform a test on the secretions of the vaginal tissue. This test is to determine if a type of bacteria, Group B streptococcus, is present. Your caregiver will explain this further. Your caregiver may ask you:  What your birth plan is.  How you are feeling.  If you are feeling the baby move.  If you have had any abnormal symptoms, such as leaking fluid, bleeding, severe headaches, or abdominal cramping.  If you have any questions. Other tests or screenings that may be performed during your third trimester include:  Blood tests that check for low iron levels (anemia).  Fetal testing to check the health, activity level, and growth of the fetus. Testing is done if you have certain medical conditions or if there are problems during the pregnancy. FALSE LABOR You may feel small, irregular contractions that   eventually go away. These are called Braxton Hicks contractions, or false labor. Contractions may last for hours, days, or even weeks before true labor sets in. If contractions come at regular intervals, intensify, or become painful, it is best to be seen by your caregiver.  SIGNS OF LABOR   Menstrual-like cramps.  Contractions that are 5 minutes apart or less.  Contractions that start on the top of the uterus and spread down to the lower abdomen and back.  A sense of increased pelvic pressure or back  pain.  A watery or bloody mucus discharge that comes from the vagina. If you have any of these signs before the 37th week of pregnancy, call your caregiver right away. You need to go to the hospital to get checked immediately. HOME CARE INSTRUCTIONS   Avoid all smoking, herbs, alcohol, and unprescribed drugs. These chemicals affect the formation and growth of the baby.  Follow your caregiver's instructions regarding medicine use. There are medicines that are either safe or unsafe to take during pregnancy.  Exercise only as directed by your caregiver. Experiencing uterine cramps is a good sign to stop exercising.  Continue to eat regular, healthy meals.  Wear a good support bra for breast tenderness.  Do not use hot tubs, steam rooms, or saunas.  Wear your seat belt at all times when driving.  Avoid raw meat, uncooked cheese, cat litter boxes, and soil used by cats. These carry germs that can cause birth defects in the baby.  Take your prenatal vitamins.  Try taking a stool softener (if your caregiver approves) if you develop constipation. Eat more high-fiber foods, such as fresh vegetables or fruit and whole grains. Drink plenty of fluids to keep your urine clear or pale yellow.  Take warm sitz baths to soothe any pain or discomfort caused by hemorrhoids. Use hemorrhoid cream if your caregiver approves.  If you develop varicose veins, wear support hose. Elevate your feet for 15 minutes, 3-4 times a day. Limit salt in your diet.  Avoid heavy lifting, wear low heal shoes, and practice good posture.  Rest a lot with your legs elevated if you have leg cramps or low back pain.  Visit your dentist if you have not gone during your pregnancy. Use a soft toothbrush to brush your teeth and be gentle when you floss.  A sexual relationship may be continued unless your caregiver directs you otherwise.  Do not travel far distances unless it is absolutely necessary and only with the approval  of your caregiver.  Take prenatal classes to understand, practice, and ask questions about the labor and delivery.  Make a trial run to the hospital.  Pack your hospital bag.  Prepare the baby's nursery.  Continue to go to all your prenatal visits as directed by your caregiver. SEEK MEDICAL CARE IF:  You are unsure if you are in labor or if your water has broken.  You have dizziness.  You have mild pelvic cramps, pelvic pressure, or nagging pain in your abdominal area.  You have persistent nausea, vomiting, or diarrhea.  You have a bad smelling vaginal discharge.  You have pain with urination. SEEK IMMEDIATE MEDICAL CARE IF:   You have a fever.  You are leaking fluid from your vagina.  You have spotting or bleeding from your vagina.  You have severe abdominal cramping or pain.  You have rapid weight loss or gain.  You have shortness of breath with chest pain.  You notice sudden or extreme swelling   of your face, hands, ankles, feet, or legs.  You have not felt your baby move in over an hour.  You have severe headaches that do not go away with medicine.  You have vision changes. Document Released: 07/11/2001 Document Revised: 07/22/2013 Document Reviewed: 09/17/2012 [employer] Patient Information 2015 [employer]. This information is not intended to replace advice given to you by your health care provider. Make sure you discuss any questions you have with your health care provider.  

## 2014-05-21 ENCOUNTER — Encounter (HOSPITAL_COMMUNITY): Payer: Self-pay

## 2014-05-21 ENCOUNTER — Inpatient Hospital Stay (HOSPITAL_COMMUNITY)
Admission: AD | Admit: 2014-05-21 | Discharge: 2014-05-21 | Disposition: A | Discharge status: Home / Self Care | Payer: Medicaid Other | Source: Ambulatory Visit | Attending: Obstetrics & Gynecology | Admitting: Obstetrics & Gynecology

## 2014-05-21 DIAGNOSIS — O26893 Other specified pregnancy related conditions, third trimester: Secondary | ICD-10-CM | POA: Diagnosis not present

## 2014-05-21 DIAGNOSIS — R102 Pelvic and perineal pain: Secondary | ICD-10-CM | POA: Insufficient documentation

## 2014-05-21 DIAGNOSIS — O99333 Smoking (tobacco) complicating pregnancy, third trimester: Secondary | ICD-10-CM | POA: Insufficient documentation

## 2014-05-21 DIAGNOSIS — Z3A36 36 weeks gestation of pregnancy: Secondary | ICD-10-CM | POA: Diagnosis not present

## 2014-05-21 DIAGNOSIS — F17210 Nicotine dependence, cigarettes, uncomplicated: Secondary | ICD-10-CM | POA: Insufficient documentation

## 2014-05-21 LAB — WET PREP, GENITAL
Clue Cells Wet Prep HPF POC: NONE SEEN
TRICH WET PREP: NONE SEEN
YEAST WET PREP: NONE SEEN

## 2014-05-21 LAB — POCT FERN TEST: POCT FERN TEST: NEGATIVE

## 2014-05-21 MED ORDER — TRAMADOL HCL 50 MG PO TABS: 50.0000 mg | ORAL_TABLET | Freq: Four times a day (QID) | ORAL | Status: DC | PRN

## 2014-05-21 MED ORDER — TRAMADOL HCL 50 MG PO TABS: 50.0000 mg | ORAL_TABLET | Freq: Once | ORAL | Status: DC

## 2014-05-21 NOTE — MAU Provider Note (Signed)
History     CSN: [id number]  Arrival date and time: 05/21/14 0234   None     Chief Complaint  Patient presents with  . Labor Eval   HPI  Eileen Tate is a 28 y.o. [phone] at 5957w3d who presents with leaking fluid for 4 days and mild intermittent abdominal pain. Patient was seen in the MAU 4 days ago for the same complaints.  Pain located mostly in groin and below umbilicus, described as sharp. Has tried tylenol without significant relief. Reports that she has been continually leaking fluid for the past 4 days. Reports wearing pull up diapers that have been soaked.   Denies fevers or chills. No dysuria. No malodorous discharge.   Denies large gushes of fluid. Some small spotting earlier in the day, otherwise no vaginal bleeding. No contractions. Good fetal movement.     OB History   Grav Para Term Preterm Abortions TAB SAB Ect Mult Living   4 3 3       3       Past Medical History  Diagnosis Date  . Abscess and cellulitis   . Asthma   . Tobacco use disorder   . UTI (lower urinary tract infection)   . Heart murmur     Past Surgical History  Procedure Laterality Date  . No past surgeries      Family History  Problem Relation Age of Onset  . Cancer Maternal Aunt 19    breast  . Cancer Paternal Aunt 3047    breast  . Diabetes Paternal Grandmother   . Heart disease Paternal Grandmother     History  Substance Use Topics  . Smoking status: Current Some Day Smoker -- 0.50 packs/day for 9 years    Types: Cigarettes  . Smokeless tobacco: Never Used  . Alcohol Use: No     Comment: occasion    Allergies:  Allergies  Allergen Reactions  . Ibuprofen Other (See Comments)    Causes blisters    Prescriptions prior to admission  Medication Sig Dispense Refill  . acetaminophen (TYLENOL) 325 MG tablet Take 2 tablets (650 mg total) by mouth every 6 (six) hours as needed.      . Prenatal Vit-Fe Fumarate-FA (PRENATAL MULTIVITAMIN) TABS Take 1 tablet by mouth at  bedtime.      Marland Kitchen. azithromycin (ZITHROMAX) 250 MG tablet Take 1 tablet every day until finished.  4 tablet  0  . cyclobenzaprine (FLEXERIL) 5 MG tablet Take 1 tablet (5 mg total) by mouth 3 (three) times daily as needed for muscle spasms.  20 tablet  0  . HYDROcodone-acetaminophen (NORCO/VICODIN) 5-325 MG per tablet Take 1 tablet by mouth every 6 (six) hours as needed.  15 tablet  0  . levalbuterol (XOPENEX HFA) 45 MCG/ACT inhaler Inhale 1 puff into the lungs daily as needed. For shortness of breath      . predniSONE (DELTASONE) 10 MG tablet Take 3 tablets (30 mg total) by mouth daily.  15 tablet  0  . PRESCRIPTION MEDICATION Ventolin Inhaler        Review of Systems  All other systems reviewed and are negative.  Physical Exam   Blood pressure 110/67, pulse 79, temperature 98.1 F (36.7 C), temperature source Oral, resp. rate 16, last menstrual period 08/31/2013.  Physical Exam  Nursing note and vitals reviewed. Constitutional: She is oriented to person, place, and time. She appears well-developed and well-nourished. No distress.  HENT:  Head: Normocephalic and atraumatic.  Eyes: EOM are  normal.  Neck: Normal range of motion.  Cardiovascular: Normal rate, regular rhythm and intact distal pulses.   No murmur heard. Respiratory: Effort normal. No respiratory distress.  GI: Soft. There is no tenderness.  Uterine size appropriate for gestational age  Musculoskeletal: Normal range of motion. She exhibits no edema.  Neurological: She is oriented to person, place, and time. She has normal reflexes.  Skin: Skin is warm and dry.  SSE: No pooling. No lesions. Scant amount of white discharge noted. POC fern test negative. (Of note, pelvic area with odor of urine) FHT:  FHR: 130 bpm, variability: moderate,  accelerations:  present,  decelerations:  none  Procedures-None  Results for orders placed during the hospital encounter of 05/21/14 (from the past 24 hour(s))  WET PREP, GENITAL      Status: Abnormal   Collection Time    05/21/14  3:52 AM      Result Value Ref Range   Yeast Wet Prep HPF POC NONE SEEN  NONE SEEN   Trich, Wet Prep NONE SEEN  NONE SEEN   Clue Cells Wet Prep HPF POC NONE SEEN  NONE SEEN   WBC, Wet Prep HPF POC FEW (*) NONE SEEN     Assessment and Plan  A: 3488w3d IUP. Wet prep unremarkable. Fern test negative. FHT category 1. Likely urinary incontinence and round ligament pain.  P: Discharge home in stable condition. Tramadol 50mg  #30 given. Labor precautions and kickcounts reviewed.   Jacquiline Doearker, Caleb 05/21/2014, 4:08 AM   I have participated in the care of this patient and I agree with the above. Cam HaiSHAW, Josely Moffat CNM 9:33 AM 05/21/2014

## 2014-05-21 NOTE — Discharge Instructions (Signed)
Third Trimester of Pregnancy The third trimester is from week 29 through week 42, months 7 through 9. The third trimester is a time when the fetus is growing rapidly. At the end of the ninth month, the fetus is about 20 inches in length and weighs 6-10 pounds.  BODY CHANGES Your body goes through many changes during pregnancy. The changes vary from woman to woman.   Your weight will continue to increase. You can expect to gain 25-35 pounds (11-16 kg) by the end of the pregnancy.  You may begin to get stretch marks on your hips, abdomen, and breasts.  You may urinate more often because the fetus is moving lower into your pelvis and pressing on your bladder.  You may develop or continue to have heartburn as a result of your pregnancy.  You may develop constipation because certain hormones are causing the muscles that push waste through your intestines to slow down.  You may develop hemorrhoids or swollen, bulging veins (varicose veins).  You may have pelvic pain because of the weight gain and pregnancy hormones relaxing your joints between the bones in your pelvis. Backaches may result from overexertion of the muscles supporting your posture.  You may have changes in your hair. These can include thickening of your hair, rapid growth, and changes in texture. Some women also have hair loss during or after pregnancy, or hair that feels dry or thin. Your hair will most likely return to normal after your baby is born.  Your breasts will continue to grow and be tender. A yellow discharge may leak from your breasts called colostrum.  Your belly button may stick out.  You may feel short of breath because of your expanding uterus.  You may notice the fetus "dropping," or moving lower in your abdomen.  You may have a bloody mucus discharge. This usually occurs a few days to a week before labor begins.  Your cervix becomes thin and soft (effaced) near your due date. WHAT TO EXPECT AT YOUR PRENATAL  EXAMS  You will have prenatal exams every 2 weeks until week 36. Then, you will have weekly prenatal exams. During a routine prenatal visit:  You will be weighed to make sure you and the fetus are growing normally.  Your blood pressure is taken.  Your abdomen will be measured to track your baby's growth.  The fetal heartbeat will be listened to.  Any test results from the previous visit will be discussed.  You may have a cervical check near your due date to see if you have effaced. At around 36 weeks, your caregiver will check your cervix. At the same time, your caregiver will also perform a test on the secretions of the vaginal tissue. This test is to determine if a type of bacteria, Group B streptococcus, is present. Your caregiver will explain this further. Your caregiver may ask you:  What your birth plan is.  How you are feeling.  If you are feeling the baby move.  If you have had any abnormal symptoms, such as leaking fluid, bleeding, severe headaches, or abdominal cramping.  If you have any questions. Other tests or screenings that may be performed during your third trimester include:  Blood tests that check for low iron levels (anemia).  Fetal testing to check the health, activity level, and growth of the fetus. Testing is done if you have certain medical conditions or if there are problems during the pregnancy. FALSE LABOR You may feel small, irregular contractions that   eventually go away. These are called Braxton Hicks contractions, or false labor. Contractions may last for hours, days, or even weeks before true labor sets in. If contractions come at regular intervals, intensify, or become painful, it is best to be seen by your caregiver.  SIGNS OF LABOR   Menstrual-like cramps.  Contractions that are 5 minutes apart or less.  Contractions that start on the top of the uterus and spread down to the lower abdomen and back.  A sense of increased pelvic pressure or back  pain.  A watery or bloody mucus discharge that comes from the vagina. If you have any of these signs before the 37th week of pregnancy, call your caregiver right away. You need to go to the hospital to get checked immediately. HOME CARE INSTRUCTIONS   Avoid all smoking, herbs, alcohol, and unprescribed drugs. These chemicals affect the formation and growth of the baby.  Follow your caregiver's instructions regarding medicine use. There are medicines that are either safe or unsafe to take during pregnancy.  Exercise only as directed by your caregiver. Experiencing uterine cramps is a good sign to stop exercising.  Continue to eat regular, healthy meals.  Wear a good support bra for breast tenderness.  Do not use hot tubs, steam rooms, or saunas.  Wear your seat belt at all times when driving.  Avoid raw meat, uncooked cheese, cat litter boxes, and soil used by cats. These carry germs that can cause birth defects in the baby.  Take your prenatal vitamins.  Try taking a stool softener (if your caregiver approves) if you develop constipation. Eat more high-fiber foods, such as fresh vegetables or fruit and whole grains. Drink plenty of fluids to keep your urine clear or pale yellow.  Take warm sitz baths to soothe any pain or discomfort caused by hemorrhoids. Use hemorrhoid cream if your caregiver approves.  If you develop varicose veins, wear support hose. Elevate your feet for 15 minutes, 3-4 times a day. Limit salt in your diet.  Avoid heavy lifting, wear low heal shoes, and practice good posture.  Rest a lot with your legs elevated if you have leg cramps or low back pain.  Visit your dentist if you have not gone during your pregnancy. Use a soft toothbrush to brush your teeth and be gentle when you floss.  A sexual relationship may be continued unless your caregiver directs you otherwise.  Do not travel far distances unless it is absolutely necessary and only with the approval  of your caregiver.  Take prenatal classes to understand, practice, and ask questions about the labor and delivery.  Make a trial run to the hospital.  Pack your hospital bag.  Prepare the baby's nursery.  Continue to go to all your prenatal visits as directed by your caregiver. SEEK MEDICAL CARE IF:  You are unsure if you are in labor or if your water has broken.  You have dizziness.  You have mild pelvic cramps, pelvic pressure, or nagging pain in your abdominal area.  You have persistent nausea, vomiting, or diarrhea.  You have a bad smelling vaginal discharge.  You have pain with urination. SEEK IMMEDIATE MEDICAL CARE IF:   You have a fever.  You are leaking fluid from your vagina.  You have spotting or bleeding from your vagina.  You have severe abdominal cramping or pain.  You have rapid weight loss or gain.  You have shortness of breath with chest pain.  You notice sudden or extreme swelling   of your face, hands, ankles, feet, or legs.  You have not felt your baby move in over an hour.  You have severe headaches that do not go away with medicine.  You have vision changes. Document Released: 07/11/2001 Document Revised: 07/22/2013 Document Reviewed: 09/17/2012 [employer] Patient Information 2015 [employer]. This information is not intended to replace advice given to you by your health care provider. Make sure you discuss any questions you have with your health care provider.  

## 2014-05-21 NOTE — MAU Note (Signed)
Leaking clear fluid - started since Sunday but today was worse it has been.  Spotting earlier today.  Baby moving well. Hurts in chest when coughs. [facility] but [facility] due to [facility]  But wants to come here in Clinic because delivered here in past.

## 2014-06-01 ENCOUNTER — Encounter (HOSPITAL_COMMUNITY): Payer: Self-pay

## 2014-10-21 ENCOUNTER — Telehealth (HOSPITAL_COMMUNITY): Payer: Self-pay | Admitting: *Deleted

## 2014-10-21 NOTE — Telephone Encounter (Signed)
Liborio NixonJanice [facility] returned call. Advised patient had pap smear 01/27/2014 and was negative.

## 2014-12-07 ENCOUNTER — Encounter: Payer: Self-pay | Admitting: Emergency Medicine

## 2014-12-07 ENCOUNTER — Emergency Department: Payer: Medicaid Other

## 2014-12-07 ENCOUNTER — Other Ambulatory Visit: Payer: Self-pay

## 2014-12-07 ENCOUNTER — Emergency Department
Admission: EM | Admit: 2014-12-07 | Discharge: 2014-12-07 | Disposition: A | Discharge status: Home / Self Care | Payer: Medicaid Other | Attending: Emergency Medicine | Admitting: Emergency Medicine

## 2014-12-07 DIAGNOSIS — Z7952 Long term (current) use of systemic steroids: Secondary | ICD-10-CM | POA: Insufficient documentation

## 2014-12-07 DIAGNOSIS — R079 Chest pain, unspecified: Secondary | ICD-10-CM | POA: Diagnosis not present

## 2014-12-07 DIAGNOSIS — Z79899 Other long term (current) drug therapy: Secondary | ICD-10-CM | POA: Insufficient documentation

## 2014-12-07 DIAGNOSIS — Z720 Tobacco use: Secondary | ICD-10-CM | POA: Diagnosis not present

## 2014-12-07 LAB — COMPREHENSIVE METABOLIC PANEL
ALK PHOS: 72 U/L (ref 38–126)
ALT: 17 U/L (ref 14–54)
AST: 21 U/L (ref 15–41)
Albumin: 4.3 g/dL (ref 3.5–5.0)
Anion gap: 6 (ref 5–15)
BUN: 13 mg/dL (ref 6–20)
CO2: 27 mmol/L (ref 22–32)
Calcium: 9.4 mg/dL (ref 8.9–10.3)
Chloride: 106 mmol/L (ref 101–111)
Creatinine, Ser: 0.81 mg/dL (ref 0.44–1.00)
GFR calc non Af Amer: >60 mL/min (ref >60)
GLUCOSE: 81 mg/dL (ref 65–99)
POTASSIUM: 4 mmol/L (ref 3.5–5.1)
Sodium: 139 mmol/L (ref 135–145)
Total Bilirubin: 0.5 mg/dL (ref 0.3–1.2)
Total Protein: 7.3 g/dL (ref 6.5–8.1)

## 2014-12-07 LAB — CBC
HCT: 45.5 % (ref 35.0–47.0)
Hemoglobin: 15 g/dL (ref 12.0–16.0)
MCH: 30.1 pg (ref 26.0–34.0)
MCHC: 33 g/dL (ref 32.0–36.0)
MCV: 91.2 fL (ref 80.0–100.0)
PLATELETS: 281 10*3/uL (ref 150–440)
RBC: 4.98 MIL/uL (ref 3.80–5.20)
RDW: 15.2 % — AB (ref 11.5–14.5)
WBC: 6.2 10*3/uL (ref 3.6–11.0)

## 2014-12-07 LAB — TROPONIN I: Troponin I: <0.03 ng/mL (ref <0.031)

## 2014-12-07 MED ORDER — TRAMADOL HCL 50 MG PO TABS: 50.0000 mg | ORAL_TABLET | Freq: Four times a day (QID) | ORAL | Status: DC | PRN

## 2014-12-07 NOTE — Discharge Instructions (Signed)

## 2014-12-07 NOTE — ED Provider Notes (Signed)
[facility] Emergency Department Provider Note  ____________________________________________  Time seen: 1:30 PM  I have reviewed the triage vital signs and the nursing notes.   HISTORY  Chief Complaint Chest Pain      HPI Eileen Tate is a 29 y.o. female who presents with a variety of complaints: Occasional chest discomfort, left anterior thigh pain with movement, and intermittent headaches. She reports the chest discomfort is aching in nature and has been associated with a cough but she denies fever or chills. No diaphoresis, nausea or vomiting. No recent travel. No OCP. Chest discomfort has been occurring for approximately 3 days      Past Medical History  Diagnosis Date  . Abscess and cellulitis   . Asthma   . Tobacco use disorder   . UTI (lower urinary tract infection)   . Heart murmur     Patient Active Problem List   Diagnosis Date Noted  . Infections of genitourinary tract antepartum 01/13/2013  . LSIL (low grade squamous intraepithelial lesion) on Pap smear 12/04/2012  . Supervision of normal pregnancy 09/18/2012  . Discharge of breast 07/05/2012    Past Surgical History  Procedure Laterality Date  . No past surgeries      Current Outpatient Rx  Name  Route  Sig  Dispense  Refill  . acetaminophen (TYLENOL) 325 MG tablet   Oral   Take 2 tablets (650 mg total) by mouth every 6 (six) hours as needed.         Marland Kitchen. azithromycin (ZITHROMAX) 250 MG tablet      Take 1 tablet every day until finished.   4 tablet   0   . cyclobenzaprine (FLEXERIL) 5 MG tablet   Oral   Take 1 tablet (5 mg total) by mouth 3 (three) times daily as needed for muscle spasms.   20 tablet   0   . HYDROcodone-acetaminophen (NORCO/VICODIN) 5-325 MG per tablet   Oral   Take 1 tablet by mouth every 6 (six) hours as needed.   15 tablet   0   . levalbuterol (XOPENEX HFA) 45 MCG/ACT inhaler   Inhalation   Inhale 1 puff into the lungs daily as  needed. For shortness of breath         . predniSONE (DELTASONE) 10 MG tablet   Oral   Take 3 tablets (30 mg total) by mouth daily.   15 tablet   0   . Prenatal Vit-Fe Fumarate-FA (PRENATAL MULTIVITAMIN) TABS   Oral   Take 1 tablet by mouth at bedtime.         Marland Kitchen. PRESCRIPTION MEDICATION      Ventolin Inhaler         . traMADol (ULTRAM) 50 MG tablet   Oral   Take 1 tablet (50 mg total) by mouth every 6 (six) hours as needed for moderate pain.   30 tablet   0     Allergies Ibuprofen  Family History  Problem Relation Age of Onset  . Cancer Maternal Aunt 19    breast  . Cancer Paternal Aunt [id number]    breast  . Diabetes Paternal Grandmother   . Heart disease Paternal Grandmother     Social History History  Substance Use Topics  . Smoking status: Current Some Day Smoker -- 0.50 packs/day for 9 years    Types: Cigarettes  . Smokeless tobacco: Never Used  . Alcohol Use: 0.6 oz/week    1 Cans of beer per week  Comment: occasion    Review of Systems  Constitutional: Negative for fever. Eyes: Negative for visual changes. ENT: Negative for sore throat. Cardiovascular: Positive for chest discomfort Respiratory: Negative for shortness of breath. Gastrointestinal: Negative for abdominal pain, vomiting and diarrhea. Genitourinary: Negative for dysuria. Musculoskeletal: Negative for back pain. Positive for left anterior thigh pain Skin: Negative for rash. Neurological: Positive for headaches Psychiatric: Positive for anxiety  10-point ROS otherwise negative.  ____________________________________________   PHYSICAL EXAM:  VITAL SIGNS: ED Triage Vitals  Enc Vitals Group     BP 12/07/14 1220 122/79 mmHg     Pulse Rate 12/07/14 1220 75     Resp 12/07/14 1220 18     Temp 12/07/14 1220 98 F (36.7 C)     Temp Source 12/07/14 1220 Oral     SpO2 12/07/14 1220 100 %     Weight 12/07/14 1220 125 lb (56.7 kg)     Height 12/07/14 1220 4\' 11"  (1.499 m)     Head  Cir --      Peak Flow --      Pain Score 12/07/14 1223 8     Pain Loc --      Pain Edu? --      Excl. in GC? --      Constitutional: Alert and oriented. Well appearing and in no distress. Eyes: Conjunctivae are normal. PERRL. Normal extraocular movements. ENT   Head: Normocephalic and atraumatic.   Nose: No congestion/rhinnorhea.   Mouth/Throat: Mucous membranes are moist.   Neck: No stridor. Hematological/Lymphatic/Immunilogical: No cervical lymphadenopathy. Cardiovascular: Normal rate, regular rhythm. Normal and symmetric distal pulses are present in all extremities. No murmurs, rubs, or gallops. Respiratory: Normal respiratory effort without tachypnea nor retractions. Breath sounds are clear and equal bilaterally. No wheezes/rales/rhonchi. Gastrointestinal: Soft and nontender. No distention. There is no CVA tenderness. Genitourinary: deferred Musculoskeletal: Nontender with normal range of motion in all extremities. No joint effusions.  No lower extremity tenderness nor edema. Neurologic:  Normal speech and language. No gross focal neurologic deficits are appreciated. Speech is normal.  Skin:  Skin is warm, dry and intact. No rash noted. Psychiatric: Mood and affect are normal. Speech and behavior are normal. Patient exhibits appropriate insight and judgment.  ____________________________________________    LABS (pertinent positives/negatives)  Labs normal  ____________________________________________   EKG   Date: 12/07/2014  Rate: 72  Rhythm: normal sinus rhythm  QRS Axis: normal  Intervals: normal  ST/T Wave abnormalities: normal  Conduction Disutrbances: none  Narrative Interpretation: unremarkable      ____________________________________________    RADIOLOGY  Normal chest x-ray  ____________________________________________   PROCEDURES  Procedure(s) performed: None  Critical Care performed: None     ____________________________________________   INITIAL IMPRESSION / ASSESSMENT AND PLAN / ED COURSE  Pertinent labs & imaging results that were available during my care of the patient were reviewed by me and considered in my medical decision making (see chart for details).  Patient well appearing with a normal EKG and normal labs as well as a normal chest x-ray. Benign exam multiple unrelated complaints. Perkc negative, low risk for ACS given labs and EKG. Suspect muscular skeletal complaints primarily we'll discharge with NSAIDs and PCP follow-up.  ____________________________________________   FINAL CLINICAL IMPRESSION(S) / ED DIAGNOSES  Final diagnoses:  Chest pain, unspecified chest pain type     Jene Everyobert Justus Duerr, MD 12/07/14 1418

## 2014-12-07 NOTE — ED Notes (Signed)
States has intermittent sharp chest pains, hurts to take deep breath

## 2014-12-08 NOTE — H&P (Signed)
L&D Evaluation:  History Expanded:  HPI 29 year old G4P3003 at 3166w4d gestation by 7 week US derived EDC of 06/15/2014 presenting with a "large gush of fluid" at about 430 or 5pm yesterday afternoon.  Notes positive fetal movement, no vaginal bleeding, no contractions.  Denies fevers, chills, dysuria, hematuria, frequency. She has had intermittent nausea and emesis throughout this pregnancy and states that she has kept nothing down for the last 4-5 days.  BM are regular no constipation or diarrhea.    History of recurrent E. Coli UTI previously this pregnancy treated with amoxicillin.  Poor dentition and tobacco use.  Prior pregnancies uncomplicated vaginal deliveriespelvis tested to 6lbs 8oz.   Blood Type (Maternal) A negative   Group B Strep Results Maternal (Result >5wks must be treated as unknown) unknown/result > 5 weeks ago   Maternal HIV Negative   Maternal Syphilis Ab Nonreactive   Maternal Varicella Non-Immune   Rubella Results (Maternal) nonimmune   [facility]   Presents with cramping and green vaginal discharge   Patient's Medical History Asthma  Heart Disease   Patient's Surgical History none   Medications Pre Natal Vitamins  Benadryl and ?sleeping pill   Allergies ibuprofen and sudafed   Social History tobacco  1/2 PPD   Family History Non-Contributory   ROS:  ROS see HPI   Exam:  Vital Signs stable  98.9. 106/55, P83,   Urine Protein negative dipstick   General no apparent distress   Mental Status clear   Chest clear   Heart normal sinus rhythm   Abdomen gravid, non-tender   Back no CVAT   Edema no edema   Pelvic SVE: neg pooling, negative nitrazine, no inflammation, cervix closed, thick, and high   Mebranes Intact   FHT normal rate with no decels, 135 baseline/mod var/+accels/no decels   Ucx absent   Other negative vaginal pooling, negative nitrazine, negative ferning   Impression:  Impression 1) Intrauterine pregnancy at 7666w4d  gestational age, 2) no evidence of ROM, 3) nausea of pregnancy   Plan:  Plan EFM/NST, monitor contractions and for cervical change   Comments 1) question of ROM: negative testing.   Reassured patient.  This is not her first episode of this. Strict precautions given. 2) nausea: offered zofran. patient refused 3) Fetal well being reassuring given reactive NST (See characteristics above). Observed for >5830min   Follow Up Appointment already scheduled   Labs:  Lab Results:  Routine Micro:  19-Sep-15 22:41   Micro Text Report WET PREP   COMMENT                   FEW WHITE BLOOD CELLS SEEN   COMMENT                   NO TRICHOMONAS,SPERMATOZOA,YEAST,OR CLUE CELLS SEEN   ANTIBIOTIC                       Comment 1. FEW WHITE BLOOD CELLS SEEN  Comment 2. NO TRICHOMONAS,SPERMATOZOA,YEAST,OR CLUE CELLS SEEN  Result(s) reported on 18 Apr 2014 at 02:16AM.  Routine UA:  18-Sep-15 22:41   Color (UA) Straw  Clarity (UA) Clear  Glucose (UA) Negative  Bilirubin (UA) Negative  Ketones (UA) Negative  Specific Gravity (UA) 1.004  Blood (UA) Negative  pH (UA) 7.0  Protein (UA) Negative  Nitrite (UA) Negative  Leukocyte Esterase (UA) Negative (Result(s) reported on 17 Apr 2014 at 11:52PM.)  RBC (UA) NONE SEEN  WBC (  UA) 1 /HPF  Bacteria (UA) TRACE  Epithelial Cells (UA) 3 /HPF (Result(s) reported on 17 Apr 2014 at 11:52PM.)   Electronic Signatures: Conard NovakJackson, Shawnell Dykes D (MD)  (Signed 19-Sep-15 04:26)  Authored: L&D Evaluation, Labs   Last Updated: 19-Sep-15 04:26 by Conard NovakJackson, Leilanny Fluitt D (MD)

## 2014-12-08 NOTE — H&P (Signed)
Tate&D Evaluation:  History:  HPI 29 year old G4P3003 at [redacted]wks gestation by 7 week US derived EDC of 06/15/2014 presenting with green vaginal discharge,cramping, and right sided back pain. Noticed the green discharge today, spotting when wiping on a couple of occasions today,a nd vulvar irritation. Has had cramping in the lower abdomen and in right side of back off and on since last night. Denies dysuria, hematuria, frequency. "feels warm" but has not taken temperature. Has lower abdominal pressure.  She has had intermittent nausea and emesis throughout this pregnancy and could not keep down solid foods yesterday, but has kept down liquids today. BM are regular no constipation or diarrhea.    History of recurrent E. Coli UTI previously this pregnancy treated with amoxicillin.  Poor dentition and tobacco use.  Prior pregnancies uncomplicated vaginal deliveriespelvis tested to 6lbs 8oz.  +FM   Presents with back pain, cramping and green vaginal discharge   Patient's Medical History No Chronic Illness  Heart Disease   Patient's Surgical History none   Medications Pre Natal Vitamins  Benadryl and ?sleeping pill   Allergies ibuprofen and sudafed   Social History tobacco  1/2 PPD   Family History Non-Contributory   ROS:  ROS see HPI   Exam:  Vital Signs stable  98.8. 106/67   Urine Protein pending   General no apparent distress   Mental Status clear   Back +/- right CVAT   Pelvic inflammed introitus, wet prep positive for hyphae, neg Trich and clue cells. CX: closed/long/OOP   Mebranes Intact   FHT normal rate with no decels, 145 with accels to 160s to 170s   Ucx absent   Other 4 cm plaque on left inner buttock/lower labial area   Impression:  Impression IUP at 29 weeks with monilial vulvovaginitis  R/O UTI. No evidence of labor   Plan:  Plan EFM/NST, monitor contractions and for cervical change, Diflucan 150 mgm po. Mycolog II tid. UA pending   Comments UA with sp  grav 1.010 and ess negative. DC home. FU at [facility] this week as scheduled.   Electronic Signatures: Eileen Tate, Eileen Tate (CNM)  (Signed 31-Aug-15 01:57)  Authored: Tate&D Evaluation   Last Updated: 31-Aug-15 01:57 by Eileen Tate, Joziyah Roblero Tate (CNM)

## 2014-12-08 NOTE — H&P (Signed)
L&D Evaluation:  History:  HPI 29 year old G4P3003 at 4023w1d by 7 week US derived EDC of 06/15/2014 presenting with cramping. States feels like menstrual cramps.  No tightening noted by patient.  Also has had some LOF.  She has had intermitten nausea and emesis throughout this pregnacy one epsisode of emesis in triage.  She denies dysuria.  BM are regular no constipation or diarrhea, last BM this morning.  No other sick contacts.  SHe also noted some light pink when whipping after urinating today.  No recent intercourse of cervical checks    History of recurrent E. Coli UTI previously this pregnancy treated with amoxicillin.  Poor dentition and tobacco use.  Prior pregnancies uncomplicated vaginal deliveriespelvis tested to 6lbs 8oz.  +FM   Presents with abdominal pain   Patient's Medical History No Chronic Illness  Asthma   Social History tobacco  1/2 PPD   Family History mucinex, ibuprofen, PCN, sudafed    ROS:  ROS negative unless otherwise noted in HPI   Exam:  Vital Signs stable   Urine Protein UA pending   General no apparent distress   Chest clear    Abdomen gravid, non-tender   Estimated Fetal Weight Average for gestational age   Back no CVAT   Edema no edema    Pelvic no external lesions, cervix closed and thick   Mebranes Intact, negative nitrazine and pooling   FHT normal rate with no decels, 140, moderate, +accels, no decels   Ucx absent   Other Wet mount: negative hyphea, yeast, or trichomonas   Impression:  Impression 26 weeks multiple complaints including cramping, spotting, and discharge   Plan:  Comments 1) Cramping       - UA negative (also appears well hydrated by negative ketones, low specific gravity).   2) Vaginal bleeding - reassuring fetal surveillance for 26 weeks, no blood on exam, no blood on UA  3) Disposition - discharge home follow up 8/12, no evidence of PTL, no risk factors for PTL, no evidence of contractions, negative wet  mount (likely physiologic discharge) Routine labor precautions   Electronic Signatures: Lorrene ReidStaebler, Garwood Wentzell M (MD)  (Signed 11-Aug-15 19:44)  Authored: L&D Evaluation   Last Updated: 11-Aug-15 19:44 by Lorrene ReidStaebler, Orvell Careaga M (MD)

## 2015-02-24 ENCOUNTER — Encounter: Payer: Self-pay | Admitting: Emergency Medicine

## 2015-02-24 ENCOUNTER — Emergency Department
Admission: EM | Admit: 2015-02-24 | Discharge: 2015-02-24 | Disposition: A | Discharge status: Home / Self Care | Payer: Medicaid Other | Attending: Emergency Medicine | Admitting: Emergency Medicine

## 2015-02-24 DIAGNOSIS — X58XXXA Exposure to other specified factors, initial encounter: Secondary | ICD-10-CM | POA: Diagnosis not present

## 2015-02-24 DIAGNOSIS — Z3202 Encounter for pregnancy test, result negative: Secondary | ICD-10-CM | POA: Insufficient documentation

## 2015-02-24 DIAGNOSIS — N898 Other specified noninflammatory disorders of vagina: Secondary | ICD-10-CM | POA: Insufficient documentation

## 2015-02-24 DIAGNOSIS — T148XXA Other injury of unspecified body region, initial encounter: Secondary | ICD-10-CM

## 2015-02-24 DIAGNOSIS — Z720 Tobacco use: Secondary | ICD-10-CM | POA: Insufficient documentation

## 2015-02-24 DIAGNOSIS — N72 Inflammatory disease of cervix uteri: Secondary | ICD-10-CM | POA: Insufficient documentation

## 2015-02-24 DIAGNOSIS — Y9389 Activity, other specified: Secondary | ICD-10-CM | POA: Diagnosis not present

## 2015-02-24 DIAGNOSIS — S76111A Strain of right quadriceps muscle, fascia and tendon, initial encounter: Secondary | ICD-10-CM | POA: Insufficient documentation

## 2015-02-24 DIAGNOSIS — Z792 Long term (current) use of antibiotics: Secondary | ICD-10-CM | POA: Diagnosis not present

## 2015-02-24 DIAGNOSIS — Y9289 Other specified places as the place of occurrence of the external cause: Secondary | ICD-10-CM | POA: Insufficient documentation

## 2015-02-24 DIAGNOSIS — Y998 Other external cause status: Secondary | ICD-10-CM | POA: Insufficient documentation

## 2015-02-24 DIAGNOSIS — N3090 Cystitis, unspecified without hematuria: Secondary | ICD-10-CM | POA: Diagnosis not present

## 2015-02-24 DIAGNOSIS — R1031 Right lower quadrant pain: Secondary | ICD-10-CM | POA: Diagnosis present

## 2015-02-24 LAB — COMPREHENSIVE METABOLIC PANEL
ALT: 17 U/L (ref 14–54)
ANION GAP: 9 (ref 5–15)
AST: 20 U/L (ref 15–41)
Albumin: 4.1 g/dL (ref 3.5–5.0)
Alkaline Phosphatase: 67 U/L (ref 38–126)
BUN: 9 mg/dL (ref 6–20)
CO2: 23 mmol/L (ref 22–32)
CREATININE: 0.84 mg/dL (ref 0.44–1.00)
Calcium: 9.4 mg/dL (ref 8.9–10.3)
Chloride: 104 mmol/L (ref 101–111)
GFR calc Af Amer: >60 mL/min (ref >60)
Glucose, Bld: 102 mg/dL — ABNORMAL HIGH (ref 65–99)
Potassium: 3.5 mmol/L (ref 3.5–5.1)
SODIUM: 136 mmol/L (ref 135–145)
Total Bilirubin: 0.7 mg/dL (ref 0.3–1.2)
Total Protein: 7.5 g/dL (ref 6.5–8.1)

## 2015-02-24 LAB — CBC WITH DIFFERENTIAL/PLATELET
Basophils Absolute: 0 10*3/uL (ref 0–0.1)
Basophils Relative: 1 %
EOS ABS: 0.1 10*3/uL (ref 0–0.7)
EOS PCT: 1 %
HEMATOCRIT: 44.4 % (ref 35.0–47.0)
HEMOGLOBIN: 15.1 g/dL (ref 12.0–16.0)
LYMPHS ABS: 1.5 10*3/uL (ref 1.0–3.6)
LYMPHS PCT: 17 %
MCH: 31.2 pg (ref 26.0–34.0)
MCHC: 34 g/dL (ref 32.0–36.0)
MCV: 91.6 fL (ref 80.0–100.0)
MONO ABS: 0.4 10*3/uL (ref 0.2–0.9)
Monocytes Relative: 5 %
NEUTROS ABS: 6.5 10*3/uL (ref 1.4–6.5)
NEUTROS PCT: 76 %
Platelets: 232 10*3/uL (ref 150–440)
RBC: 4.85 MIL/uL (ref 3.80–5.20)
RDW: 14 % (ref 11.5–14.5)
WBC: 8.6 10*3/uL (ref 3.6–11.0)

## 2015-02-24 LAB — URINALYSIS COMPLETE WITH MICROSCOPIC (ARMC ONLY)
BACTERIA UA: NONE SEEN
Bilirubin Urine: NEGATIVE
GLUCOSE, UA: NEGATIVE mg/dL
Hgb urine dipstick: NEGATIVE
Ketones, ur: NEGATIVE mg/dL
Nitrite: NEGATIVE
Protein, ur: NEGATIVE mg/dL
Specific Gravity, Urine: 1.017 (ref 1.005–1.030)
pH: 6 (ref 5.0–8.0)

## 2015-02-24 LAB — CHLAMYDIA/NGC RT PCR (ARMC ONLY)
Chlamydia Tr: NOT DETECTED
N gonorrhoeae: NOT DETECTED

## 2015-02-24 LAB — WET PREP, GENITAL
Clue Cells Wet Prep HPF POC: NONE SEEN
TRICH WET PREP: NONE SEEN
YEAST WET PREP: NONE SEEN

## 2015-02-24 LAB — POCT PREGNANCY, URINE: PREG TEST UR: NEGATIVE

## 2015-02-24 MED ORDER — CEFTRIAXONE SODIUM 250 MG IJ SOLR
250.0000 mg | INTRAMUSCULAR | Status: DC
  Administered 2015-02-24: 250 mg via INTRAMUSCULAR

## 2015-02-24 MED ORDER — CIPROFLOXACIN HCL 500 MG PO TABS: 500.0000 mg | ORAL_TABLET | Freq: Two times a day (BID) | ORAL | Status: AC

## 2015-02-24 MED ORDER — AZITHROMYCIN 250 MG PO TABS
ORAL_TABLET | ORAL | Status: AC
  Filled 2015-02-24: qty 4

## 2015-02-24 MED ORDER — IBUPROFEN 800 MG PO TABS: 800.0000 mg | ORAL_TABLET | Freq: Three times a day (TID) | ORAL | Status: DC | PRN

## 2015-02-24 MED ORDER — CEFTRIAXONE SODIUM 250 MG IJ SOLR
INTRAMUSCULAR | Status: AC
  Filled 2015-02-24: qty 250

## 2015-02-24 MED ORDER — AZITHROMYCIN 250 MG PO TABS
1000.0000 mg | ORAL_TABLET | Freq: Once | ORAL | Status: AC
  Administered 2015-02-24: 1000 mg via ORAL

## 2015-02-24 NOTE — ED Provider Notes (Signed)
[facility] Emergency Department Provider Note     Time seen: ----------------------------------------- 4:31 PM on 02/24/2015 -----------------------------------------    I have reviewed the triage vital signs and the nursing notes.   HISTORY  Chief Complaint Abdominal Pain    HPI Eileen Tate is a 29 y.o. female who presents with right-sided lower abdominal pain. She also complaining of bilateral leg pain and vaginal discharge. Patient states she took a home pregnancy test 2 days ago and was positive. Was seen in the past at [facility] in [place] for leg pain and ultrasound was done which was negative for DVT. She states movement makes his her leg pain worse. Nothing changes the abdominal pain. Currently not using protection to prevent pregnancy   Past Medical History  Diagnosis Date  . Abscess and cellulitis   . Asthma   . Tobacco use disorder   . UTI (lower urinary tract infection)   . Heart murmur     Patient Active Problem List   Diagnosis Date Noted  . Infections of genitourinary tract antepartum 01/13/2013  . LSIL (low grade squamous intraepithelial lesion) on Pap smear 12/04/2012  . Supervision of normal pregnancy 09/18/2012  . Discharge of breast 07/05/2012    Past Surgical History  Procedure Laterality Date  . No past surgeries      Allergies Ibuprofen  Social History History  Substance Use Topics  . Smoking status: Current Every Day Smoker -- 0.50 packs/day for 9 years    Types: Cigarettes  . Smokeless tobacco: Never Used  . Alcohol Use: 0.6 oz/week    1 Cans of beer per week     Comment: occasion   Review of Systems Constitutional: Negative for fever. Eyes: Negative for visual changes. ENT: Negative for sore throat. Cardiovascular: Negative for chest pain. Respiratory: Negative for shortness of breath. Gastrointestinal: Positive for abdominal pain Genitourinary: Negative for dysuria. Positive vaginal  discharge Musculoskeletal: Negative for back pain. Positive for leg pain Skin: Negative for rash. Neurological: Negative for headaches, focal weakness or numbness.  10-point ROS otherwise negative.  ____________________________________________   PHYSICAL EXAM:  VITAL SIGNS: ED Triage Vitals  Enc Vitals Group     BP 02/24/15 1509 95/59 mmHg     Pulse Rate 02/24/15 1509 81     Resp 02/24/15 1509 18     Temp 02/24/15 1509 98.4 F (36.9 C)     Temp Source 02/24/15 1509 Oral     SpO2 02/24/15 1509 99 %     Weight 02/24/15 1509 145 lb (65.772 kg)     Height 02/24/15 1509 5' (1.524 m)     Head Cir --      Peak Flow --      Pain Score 02/24/15 1509 7     Pain Loc --      Pain Edu? --      Excl. in GC? --     Constitutional: Alert and oriented. Well appearing and in no distress. Eyes: Conjunctivae are normal. PERRL. Normal extraocular movements. ENT   Head: Normocephalic and atraumatic.   Nose: No congestion/rhinnorhea.   Mouth/Throat: Mucous membranes are moist.   Neck: No stridor. Cardiovascular: Normal rate, regular rhythm. Normal and symmetric distal pulses are present in all extremities. No murmurs, rubs, or gallops. Respiratory: Normal respiratory effort without tachypnea nor retractions. Breath sounds are clear and equal bilaterally. No wheezes/rales/rhonchi. Gastrointestinal: Mild lower abdominal tenderness, no rebound or guarding. Musculoskeletal: There appears to be some quadriceps femoris tenderness in the right  leg. No joint effusions.  No lower extremity tenderness nor edema. Genitourinary: There is cervical motion tenderness, thin vaginal discharge with mild inflammation. Neurologic:  Normal speech and language. No gross focal neurologic deficits are appreciated. Speech is normal. No gait instability. Skin:  Skin is warm, dry and intact. No rash noted. Psychiatric: Mood and affect are normal. Speech and behavior are normal. Patient exhibits appropriate  insight and judgment.  ____________________________________________  ED COURSE:  Pertinent labs & imaging results that were available during my care of the patient were reviewed by me and considered in my medical decision making (see chart for details). Likely pelvic inflammatory disease with muscle strength in the leg. We'll check labs and reevaluate. ____________________________________________    LABS (pertinent positives/negatives)  Labs Reviewed  WET PREP, GENITAL - Abnormal; Notable for the following:    WBC, Wet Prep HPF POC FEW (*)    All other components within normal limits  COMPREHENSIVE METABOLIC PANEL - Abnormal; Notable for the following:    Glucose, Bld 102 (*)    All other components within normal limits  URINALYSIS COMPLETEWITH MICROSCOPIC ([facility] ONLY) - Abnormal; Notable for the following:    Color, Urine YELLOW (*)    APPearance CLOUDY (*)    Leukocytes, UA 3+ (*)    Squamous Epithelial / LPF 6-30 (*)    All other components within normal limits  CHLAMYDIA/NGC RT PCR ([facility] ONLY)  CBC WITH DIFFERENTIAL/PLATELET  POCT PREGNANCY, URINE   ____________________________________________  FINAL ASSESSMENT AND PLAN  Cervicitis, quadriceps strain  Plan: Patient with labs and imaging as dictated above. She was given Rocephin and Zithromax here. Will be discharged home with prescription for Cipro for several days and Motrin.   Emily Filbert, MD   Emily Filbert, MD 02/24/15 [id number]

## 2015-02-24 NOTE — Discharge Instructions (Signed)
Cervicitis Cervicitis is a soreness and swelling (inflammation) of the cervix. Your cervix is located at the bottom of your uterus. It opens up to the vagina. CAUSES   Sexually transmitted infections (STIs).   Allergic reaction.   Medicines or birth control devices that are put in the vagina.   Injury to the cervix.   Bacterial infections.  RISK FACTORS You are at greater risk if you:  Have unprotected sexual intercourse.  Have sexual intercourse with many partners.  Began sexual intercourse at an early age.  Have a history of STIs. SYMPTOMS  There may be no symptoms. If symptoms occur, they may include:   Gray, white, yellow, or bad-smelling vaginal discharge.   Pain or itching of the area outside the vagina.   Painful sexual intercourse.   Lower abdominal or lower back pain, especially during intercourse.   Frequent urination.   Abnormal vaginal bleeding between periods, after sexual intercourse, or after menopause.   Pressure or a heavy feeling in the pelvis.  DIAGNOSIS  Diagnosis is made after a pelvic exam. Other tests may include:   Examination of any discharge under a microscope (wet prep).   A Pap test.  TREATMENT  Treatment will depend on the cause of cervicitis. If it is caused by an STI, both you and your partner will need to be treated. Antibiotic medicines will be given.  HOME CARE INSTRUCTIONS   Do not have sexual intercourse until your health care provider says it is okay.   Do not have sexual intercourse until your partner has been treated, if your cervicitis is caused by an STI.   Take your antibiotics as directed. Finish them even if you start to feel better.  SEEK MEDICAL CARE IF:  Your symptoms come back.   You have a fever.  MAKE SURE YOU:   Understand these instructions.  Will watch your condition.  Will get help right away if you are not doing well or get worse. Document Released: 07/17/2005 Document Revised:  07/22/2013 Document Reviewed: 01/08/2013 [facility] Patient Information 2015 [place], [place]. This information is not intended to replace advice given to you by your health care provider. Make sure you discuss any questions you have with your health care provider.  Muscle Strain A muscle strain is an injury that occurs when a muscle is stretched beyond its normal length. Usually a small number of muscle fibers are torn when this happens. Muscle strain is rated in degrees. First-degree strains have the least amount of muscle fiber tearing and pain. Second-degree and third-degree strains have increasingly more tearing and pain.  Usually, recovery from muscle strain takes 1-2 weeks. Complete healing takes 5-6 weeks.  CAUSES  Muscle strain happens when a sudden, violent force placed on a muscle stretches it too far. This may occur with lifting, sports, or a fall.  RISK FACTORS Muscle strain is especially common in athletes.  SIGNS AND SYMPTOMS At the site of the muscle strain, there may be:  Pain.  Bruising.  Swelling.  Difficulty using the muscle due to pain or lack of normal function. DIAGNOSIS  Your health care provider will perform a physical exam and ask about your medical history. TREATMENT  Often, the best treatment for a muscle strain is resting, icing, and applying cold compresses to the injured area.  HOME CARE INSTRUCTIONS   Use the PRICE method of treatment to promote muscle healing during the first 2-3 days after your injury. The PRICE method involves:  Protecting the muscle from being  injured again.  Restricting your activity and resting the injured body part.  Icing your injury. To do this, put ice in a plastic bag. Place a towel between your skin and the bag. Then, apply the ice and leave it on from 15-20 minutes each hour. After the third day, switch to moist heat packs.  Apply compression to the injured area with a splint or elastic bandage. Be careful not to wrap it  too tightly. This may interfere with blood circulation or increase swelling.  Elevate the injured body part above the level of your heart as often as you can.  Only take over-the-counter or prescription medicines for pain, discomfort, or fever as directed by your health care provider.  Warming up prior to exercise helps to prevent future muscle strains. SEEK MEDICAL CARE IF:   You have increasing pain or swelling in the injured area.  You have numbness, tingling, or a significant loss of strength in the injured area. MAKE SURE YOU:   Understand these instructions.  Will watch your condition.  Will get help right away if you are not doing well or get worse. Document Released: 07/17/2005 Document Revised: 05/07/2013 Document Reviewed: 02/13/2013 [facility] Patient Information 2015 [place], [place]. This information is not intended to replace advice given to you by your health care provider. Make sure you discuss any questions you have with your health care provider.  Urinary Tract Infection Urinary tract infections (UTIs) can develop anywhere along your urinary tract. Your urinary tract is your body's drainage system for removing wastes and extra water. Your urinary tract includes two kidneys, two ureters, a bladder, and a urethra. Your kidneys are a pair of bean-shaped organs. Each kidney is about the size of your fist. They are located below your ribs, one on each side of your spine. CAUSES Infections are caused by microbes, which are microscopic organisms, including fungi, viruses, and bacteria. These organisms are so small that they can only be seen through a microscope. Bacteria are the microbes that most commonly cause UTIs. SYMPTOMS  Symptoms of UTIs may vary by age and gender of the patient and by the location of the infection. Symptoms in young women typically include a frequent and intense urge to urinate and a painful, burning feeling in the bladder or urethra during urination. Older  women and men are more likely to be tired, shaky, and weak and have muscle aches and abdominal pain. A fever may mean the infection is in your kidneys. Other symptoms of a kidney infection include pain in your back or sides below the ribs, nausea, and vomiting. DIAGNOSIS To diagnose a UTI, your caregiver will ask you about your symptoms. Your caregiver also will ask to provide a urine sample. The urine sample will be tested for bacteria and white blood cells. White blood cells are made by your body to help fight infection. TREATMENT  Typically, UTIs can be treated with medication. Because most UTIs are caused by a bacterial infection, they usually can be treated with the use of antibiotics. The choice of antibiotic and length of treatment depend on your symptoms and the type of bacteria causing your infection. HOME CARE INSTRUCTIONS  If you were prescribed antibiotics, take them exactly as your caregiver instructs you. Finish the medication even if you feel better after you have only taken some of the medication.  Drink enough water and fluids to keep your urine clear or pale yellow.  Avoid caffeine, tea, and carbonated beverages. They tend to irritate your bladder.  Empty your bladder often. Avoid holding urine for long periods of time.  Empty your bladder before and after sexual intercourse.  After a bowel movement, women should cleanse from front to back. Use each tissue only once. SEEK MEDICAL CARE IF:   You have back pain.  You develop a fever.  Your symptoms do not begin to resolve within 3 days. SEEK IMMEDIATE MEDICAL CARE IF:   You have severe back pain or lower abdominal pain.  You develop chills.  You have nausea or vomiting.  You have continued burning or discomfort with urination. MAKE SURE YOU:   Understand these instructions.  Will watch your condition.  Will get help right away if you are not doing well or get worse. Document Released: 04/26/2005 Document  Revised: 01/16/2012 Document Reviewed: 08/25/2011 [facility] Patient Information 2015 [place], [place]. This information is not intended to replace advice given to you by your health care provider. Make sure you discuss any questions you have with your health care provider.

## 2015-02-24 NOTE — ED Notes (Signed)
C/o RLQ abd. Pain and bilateral leg pain, states she took a pregnancy test 2 days ago and it was positive, states abd. Pain and leg pain started today, denies any vaginal bleeding

## 2015-03-02 ENCOUNTER — Emergency Department (INDEPENDENT_AMBULATORY_CARE_PROVIDER_SITE_OTHER)
Admission: EM | Admit: 2015-03-02 | Discharge: 2015-03-02 | Disposition: A | Discharge status: Home / Self Care | Payer: Medicaid Other | Source: Home / Self Care | Attending: Emergency Medicine | Admitting: Emergency Medicine

## 2015-03-02 ENCOUNTER — Encounter (HOSPITAL_COMMUNITY): Payer: Self-pay | Admitting: Emergency Medicine

## 2015-03-02 DIAGNOSIS — G44209 Tension-type headache, unspecified, not intractable: Secondary | ICD-10-CM

## 2015-03-02 DIAGNOSIS — Z331 Pregnant state, incidental: Secondary | ICD-10-CM | POA: Diagnosis not present

## 2015-03-02 DIAGNOSIS — Z3201 Encounter for pregnancy test, result positive: Secondary | ICD-10-CM | POA: Diagnosis not present

## 2015-03-02 LAB — POCT I-STAT, CHEM 8
BUN: 9 mg/dL (ref 6–20)
Calcium, Ion: 1.2 mmol/L (ref 1.12–1.23)
Chloride: 103 mmol/L (ref 101–111)
Creatinine, Ser: 0.7 mg/dL (ref 0.44–1.00)
GLUCOSE: 73 mg/dL (ref 65–99)
HCT: 48 % — ABNORMAL HIGH (ref 36.0–46.0)
HEMOGLOBIN: 16.3 g/dL — AB (ref 12.0–15.0)
Potassium: 3.7 mmol/L (ref 3.5–5.1)
Sodium: 136 mmol/L (ref 135–145)
TCO2: 20 mmol/L (ref 0–100)

## 2015-03-02 LAB — POCT URINALYSIS DIP (DEVICE)
Bilirubin Urine: NEGATIVE
GLUCOSE, UA: NEGATIVE mg/dL
Hgb urine dipstick: NEGATIVE
Ketones, ur: NEGATIVE mg/dL
NITRITE: NEGATIVE
Protein, ur: NEGATIVE mg/dL
Specific Gravity, Urine: 1.015 (ref 1.005–1.030)
UROBILINOGEN UA: 0.2 mg/dL (ref 0.0–1.0)
pH: 7 (ref 5.0–8.0)

## 2015-03-02 LAB — POCT PREGNANCY, URINE: Preg Test, Ur: POSITIVE — AB

## 2015-03-02 MED ORDER — TRAMADOL HCL 50 MG PO TABS: 50.0000 mg | ORAL_TABLET | Freq: Four times a day (QID) | ORAL | Status: DC | PRN

## 2015-03-02 MED ORDER — CYCLOBENZAPRINE HCL 5 MG PO TABS: 5.0000 mg | ORAL_TABLET | Freq: Three times a day (TID) | ORAL | Status: DC | PRN

## 2015-03-02 NOTE — ED Notes (Signed)
Patient provided a urine specimen while in the waiting room. Told the registrar that she has taken three pregnancy tests, two positive, one negative.

## 2015-03-02 NOTE — ED Notes (Signed)
C/o headache associated dizziness onset 2 days Has taken multiple preg tests at home and all have come back positive LMP = 12/2014 Alert, no signs of acute distress.

## 2015-03-02 NOTE — Discharge Instructions (Signed)
Your headache and dizziness is likely coming from muscle tension. Use the Flexeril 3 times a day as needed. Take tramadol every 8 hours as needed for severe pain. You can take Tylenol as needed. Please make an appointment with an OB/GYN to establish care for pregnancy.

## 2015-03-02 NOTE — ED Provider Notes (Signed)
CSN: [id number]     Arrival date & time 03/02/15  1745 History   First MD Initiated Contact with Patient 03/02/15 1835     Chief Complaint  Patient presents with  . Headache   (Consider location/radiation/quality/duration/timing/severity/associated sxs/prior Treatment) HPI  She is a 29 year old woman here for evaluation of headaches.  She states the headache started about 2-3 days ago. It was initially in the frontal area, but now wraps around to the occiput. She also reports the pain goes down her neck. Today, she has had some dizziness which is described as feeling off balance and spinning. This occurs randomly. She also reports diffuse body aches for the last week or so. She also reports feeling cold constantly. She also states she drinks a lot of water, but always feels dehydrated. She denies any fevers. No cough. No diarrhea. She did have one episode of vomiting this morning. She has had several positive pregnancy test at home.  Past Medical History  Diagnosis Date  . Abscess and cellulitis   . Asthma   . Tobacco use disorder   . UTI (lower urinary tract infection)   . Heart murmur    Past Surgical History  Procedure Laterality Date  . No past surgeries     Family History  Problem Relation Age of Onset  . Cancer Maternal Aunt 19    breast  . Cancer Paternal Aunt 62    breast  . Diabetes Paternal Grandmother   . Heart disease Paternal Grandmother    History  Substance Use Topics  . Smoking status: Current Every Day Smoker -- 0.50 packs/day for 9 years    Types: Cigarettes  . Smokeless tobacco: Never Used  . Alcohol Use: 0.6 oz/week    1 Cans of beer per week     Comment: occasion   OB History    Gravida Para Term Preterm AB TAB SAB Ectopic Multiple Living   Review of Systems As in history of present illness Allergies  Ibuprofen  Home Medications   Prior to Admission medications   Medication Sig Start Date End Date Taking? Authorizing Provider   acetaminophen (TYLENOL) 325 MG tablet Take 2 tablets (650 mg total) by mouth every 6 (six) hours as needed. 02/26/13   Arabella Merles, CNM  ciprofloxacin (CIPRO) 500 MG tablet Take 1 tablet (500 mg total) by mouth 2 (two) times daily. 02/24/15 03/06/15  Emily Filbert, MD  cyclobenzaprine (FLEXERIL) 5 MG tablet Take 1 tablet (5 mg total) by mouth 3 (three) times daily as needed for muscle spasms (headache). 03/02/15   Charm Rings, MD  levalbuterol [facility] HFA) 45 MCG/ACT inhaler Inhale 1 puff into the lungs daily as needed. For shortness of breath    Historical Provider, MD  Prenatal Vit-Fe Fumarate-FA (PRENATAL MULTIVITAMIN) TABS Take 1 tablet by mouth at bedtime.    Historical Provider, MD  PRESCRIPTION MEDICATION Ventolin Inhaler    Historical Provider, MD  traMADol (ULTRAM) 50 MG tablet Take 1 tablet (50 mg total) by mouth every 6 (six) hours as needed for severe pain. 03/02/15   Charm Rings, MD   BP 99/78 mmHg  Pulse 77  Temp(Src) 97.9 F (36.6 C) (Oral)  Resp 16  SpO2 100%  LMP 12/30/2014 Physical Exam  Constitutional: She is oriented to person, place, and time. She appears well-developed and well-nourished. No distress.  Neck: Neck supple.  Tender along bilateral trapezii, worse  at origin on skull  Cardiovascular: Normal rate, regular rhythm and normal heart sounds.   No murmur heard. Pulmonary/Chest: Effort normal and breath sounds normal. No respiratory distress. She has no wheezes. She has no rales.  Neurological: She is alert and oriented to person, place, and time. She exhibits normal muscle tone.    ED Course  Procedures (including critical care time) Labs Review Labs Reviewed  POCT URINALYSIS DIP (DEVICE) - Abnormal; Notable for the following:    Leukocytes, UA TRACE (*)    All other components within normal limits  POCT PREGNANCY, URINE - Abnormal; Notable for the following:    Preg Test, Ur POSITIVE (*)    All other components within normal limits  POCT I-STAT,  CHEM 8 - Abnormal; Notable for the following:    Hemoglobin 16.3 (*)    HCT 48.0 (*)    All other components within normal limits    Imaging Review No results found.   MDM   1. Tension headache   2. Pregnancy as incidental finding    Headache is likely a tension headache. Medication options are limited given her pregnancy. Will treat with Flexeril and tramadol. I do not have a good explanation for her thirst and muscle aches.  Recommended follow-up with OB/GYN to establish care.    Charm Rings, MD 03/02/15 [phone]

## 2015-03-31 ENCOUNTER — Emergency Department: Payer: Medicaid Other

## 2015-03-31 ENCOUNTER — Emergency Department
Admission: EM | Admit: 2015-03-31 | Discharge: 2015-03-31 | Disposition: A | Discharge status: Home / Self Care | Payer: Medicaid Other | Attending: Emergency Medicine | Admitting: Emergency Medicine

## 2015-03-31 ENCOUNTER — Encounter: Payer: Self-pay | Admitting: *Deleted

## 2015-03-31 DIAGNOSIS — O99331 Smoking (tobacco) complicating pregnancy, first trimester: Secondary | ICD-10-CM | POA: Diagnosis not present

## 2015-03-31 DIAGNOSIS — O2341 Unspecified infection of urinary tract in pregnancy, first trimester: Secondary | ICD-10-CM | POA: Diagnosis not present

## 2015-03-31 DIAGNOSIS — Z3A08 8 weeks gestation of pregnancy: Secondary | ICD-10-CM | POA: Insufficient documentation

## 2015-03-31 DIAGNOSIS — R319 Hematuria, unspecified: Secondary | ICD-10-CM

## 2015-03-31 DIAGNOSIS — Z79899 Other long term (current) drug therapy: Secondary | ICD-10-CM | POA: Diagnosis not present

## 2015-03-31 DIAGNOSIS — F17210 Nicotine dependence, cigarettes, uncomplicated: Secondary | ICD-10-CM | POA: Diagnosis not present

## 2015-03-31 DIAGNOSIS — N390 Urinary tract infection, site not specified: Secondary | ICD-10-CM

## 2015-03-31 DIAGNOSIS — O9989 Other specified diseases and conditions complicating pregnancy, childbirth and the puerperium: Secondary | ICD-10-CM | POA: Diagnosis present

## 2015-03-31 LAB — CBC WITH DIFFERENTIAL/PLATELET
Basophils Absolute: 0 10*3/uL (ref 0–0.1)
Basophils Relative: 0 %
Eosinophils Absolute: 0.2 10*3/uL (ref 0–0.7)
Eosinophils Relative: 3 %
HEMATOCRIT: 42 % (ref 35.0–47.0)
HEMOGLOBIN: 14.4 g/dL (ref 12.0–16.0)
LYMPHS ABS: 1.6 10*3/uL (ref 1.0–3.6)
LYMPHS PCT: 18 %
MCH: 31.3 pg (ref 26.0–34.0)
MCHC: 34.3 g/dL (ref 32.0–36.0)
MCV: 91.3 fL (ref 80.0–100.0)
MONOS PCT: 4 %
Monocytes Absolute: 0.4 10*3/uL (ref 0.2–0.9)
NEUTROS ABS: 6.7 10*3/uL — AB (ref 1.4–6.5)
NEUTROS PCT: 75 %
Platelets: 219 10*3/uL (ref 150–440)
RBC: 4.6 MIL/uL (ref 3.80–5.20)
RDW: 13.7 % (ref 11.5–14.5)
WBC: 9 10*3/uL (ref 3.6–11.0)

## 2015-03-31 LAB — COMPREHENSIVE METABOLIC PANEL
ALBUMIN: 3.6 g/dL (ref 3.5–5.0)
ALK PHOS: 51 U/L (ref 38–126)
ALT: 14 U/L (ref 14–54)
AST: 23 U/L (ref 15–41)
Anion gap: 6 (ref 5–15)
BILIRUBIN TOTAL: 0.5 mg/dL (ref 0.3–1.2)
BUN: 6 mg/dL (ref 6–20)
CALCIUM: 9.3 mg/dL (ref 8.9–10.3)
CO2: 23 mmol/L (ref 22–32)
Chloride: 107 mmol/L (ref 101–111)
Creatinine, Ser: 0.64 mg/dL (ref 0.44–1.00)
GFR calc Af Amer: >60 mL/min (ref >60)
GFR calc non Af Amer: >60 mL/min (ref >60)
GLUCOSE: 112 mg/dL — AB (ref 65–99)
Potassium: 3.4 mmol/L — ABNORMAL LOW (ref 3.5–5.1)
Sodium: 136 mmol/L (ref 135–145)
Total Protein: 6.7 g/dL (ref 6.5–8.1)

## 2015-03-31 LAB — HCG, QUANTITATIVE, PREGNANCY: hCG, Beta Chain, Quant, S: 53056 m[IU]/mL — ABNORMAL HIGH (ref <5)

## 2015-03-31 LAB — URINALYSIS COMPLETE WITH MICROSCOPIC (ARMC ONLY)
Bilirubin Urine: NEGATIVE
GLUCOSE, UA: NEGATIVE mg/dL
Hgb urine dipstick: NEGATIVE
Ketones, ur: NEGATIVE mg/dL
Nitrite: NEGATIVE
Protein, ur: NEGATIVE mg/dL
Specific Gravity, Urine: 1.017 (ref 1.005–1.030)
pH: 6 (ref 5.0–8.0)

## 2015-03-31 LAB — WET PREP, GENITAL
Trich, Wet Prep: NONE SEEN
WBC WET PREP: NONE SEEN
YEAST WET PREP: NONE SEEN

## 2015-03-31 LAB — CHLAMYDIA/NGC RT PCR (ARMC ONLY)
Chlamydia Tr: NOT DETECTED
N gonorrhoeae: NOT DETECTED

## 2015-03-31 MED ORDER — ACETAMINOPHEN-CODEINE #2 300-15 MG PO TABS: 2.0000 | ORAL_TABLET | ORAL | Status: DC | PRN

## 2015-03-31 MED ORDER — CEPHALEXIN 500 MG PO CAPS: 500.0000 mg | ORAL_CAPSULE | Freq: Four times a day (QID) | ORAL | Status: AC

## 2015-03-31 MED ORDER — CYCLOBENZAPRINE HCL 5 MG PO TABS: 5.0000 mg | ORAL_TABLET | Freq: Three times a day (TID) | ORAL | Status: DC | PRN

## 2015-03-31 NOTE — Discharge Instructions (Signed)
Pregnancy and Urinary Tract Infection °A urinary tract infection (UTI) is a bacterial infection of the urinary tract. Infection of the urinary tract can include the ureters, kidneys (pyelonephritis), bladder (cystitis), and urethra (urethritis). All pregnant women should be screened for bacteria in the urinary tract. Identifying and treating a UTI will decrease the risk of preterm labor and developing more serious infections in both the mother and baby. °CAUSES °Bacteria germs cause almost all UTIs.  °RISK FACTORS °Many factors can increase your chances of getting a UTI during pregnancy. These include: °· Having a short urethra. °· Poor toilet and hygiene habits. °· Sexual intercourse. °· Blockage of urine along the urinary tract. °· Problems with the pelvic muscles or nerves. °· Diabetes. °· Obesity. °· Bladder problems after having several children. °· Previous history of UTI. °SIGNS AND SYMPTOMS  °· Pain, burning, or a stinging feeling when urinating. °· Suddenly feeling the need to urinate right away (urgency). °· Loss of bladder control (urinary incontinence). °· Frequent urination, more than is common with pregnancy. °· Lower abdominal or back discomfort. °· Cloudy urine. °· Blood in the urine (hematuria). °· Fever.  °When the kidneys are infected, the symptoms may be: °· Back pain. °· Flank pain on the right side more so than the left. °· Fever. °· Chills. °· Nausea. °· Vomiting. °DIAGNOSIS  °A urinary tract infection is usually diagnosed through urine tests. Additional tests and procedures are sometimes done. These may include: °· Ultrasound exam of the kidneys, ureters, bladder, and urethra. °· Looking in the bladder with a lighted tube (cystoscopy). °TREATMENT °Typically, UTIs can be treated with antibiotic medicines.  °HOME CARE INSTRUCTIONS  °· Only take over-the-counter or prescription medicines as directed by your health care provider. If you were prescribed antibiotics, take them as directed. Finish  them even if you start to feel better. °· Drink enough fluids to keep your urine clear or pale yellow. °· Do not have sexual intercourse until the infection is gone and your health care provider says it is okay. °· Make sure you are tested for UTIs throughout your pregnancy. These infections often come back.  °Preventing a UTI in the Future °· Practice good toilet habits. Always wipe from front to back. Use the tissue only once. °· Do not hold your urine. Empty your bladder as soon as possible when the urge comes. °· Do not douche or use deodorant sprays. °· Wash with soap and warm water around the genital area and the anus. °· Empty your bladder before and after sexual intercourse. °· Wear underwear with a cotton crotch. °· Avoid caffeine and carbonated drinks. They can irritate the bladder. °· Drink cranberry juice or take cranberry pills. This may decrease the risk of getting a UTI. °· Do not drink alcohol. °· Keep all your appointments and tests as scheduled.  °SEEK MEDICAL CARE IF:  °· Your symptoms get worse. °· You are still having fevers 2 or more days after treatment begins. °· You have a rash. °· You feel that you are having problems with medicines prescribed. °· You have abnormal vaginal discharge. °SEEK IMMEDIATE MEDICAL CARE IF:  °· You have back or flank pain. °· You have chills. °· You have blood in your urine. °· You have nausea and vomiting. °· You have contractions of your uterus. °· You have a gush of fluid from the vagina. °MAKE SURE YOU: °· Understand these instructions.   °· Will watch your condition.   °· Will get help right away if you are not doing   well or get worse.   °Document Released: 11/11/2010 Document Revised: 05/07/2013 Document Reviewed: 02/13/2013 °[employer]® Patient Information ©2015 [employer]. This information is not intended to replace advice given to you by your health care provider. Make sure you discuss any questions you have with your health care provider. ° °

## 2015-03-31 NOTE — ED Notes (Signed)
Pt presents c/o pain in her llq that radiates to back. She states that she has had pain since last night (5/10). She does have intermittent stronger pains. Pt states that she has been trying to go to [facility] and had appt to be worked in tomorrow, but they told her to come here since she is having pain and cramping. Pt does not know how far along she is in her pregnancy. She also states that she was given tramadol and muscle relaxers, but she has not had prescriptions filled due to lack of funds. She also reports vomiting since last week, presumably related to pregnancy. NAD noted.

## 2015-03-31 NOTE — ED Provider Notes (Signed)
[facility] Emergency Department Provider Note ____________________________________________  Time seen: Approximately 3:18 PM  I have reviewed the triage vital signs and the nursing notes.   HISTORY  Chief Complaint Abdominal Pain   HPI Eileen Tate is a 29 y.o. female who presents to the emergency department for pain in the left lower quadrant that radiates to her back. She is also complaining of vaginal discharge and cramping. She is approximately [redacted] weeks pregnant. She denies vaginal bleeding.   Past Medical History  Diagnosis Date  . Abscess and cellulitis   . Asthma   . Tobacco use disorder   . UTI (lower urinary tract infection)   . Heart murmur     Patient Active Problem List   Diagnosis Date Noted  . Infections of genitourinary tract antepartum 01/13/2013  . LSIL (low grade squamous intraepithelial lesion) on Pap smear 12/04/2012  . Supervision of normal pregnancy 09/18/2012  . Discharge of breast 07/05/2012    Past Surgical History  Procedure Laterality Date  . No past surgeries      Current Outpatient Rx  Name  Route  Sig  Dispense  Refill  . acetaminophen (TYLENOL) 325 MG tablet   Oral   Take 2 tablets (650 mg total) by mouth every 6 (six) hours as needed.         . cyclobenzaprine (FLEXERIL) 5 MG tablet   Oral   Take 1 tablet (5 mg total) by mouth 3 (three) times daily as needed for muscle spasms (headache).   30 tablet   0   . levalbuterol (XOPENEX HFA) 45 MCG/ACT inhaler   Inhalation   Inhale 1 puff into the lungs daily as needed. For shortness of breath         . Prenatal Vit-Fe Fumarate-FA (PRENATAL MULTIVITAMIN) TABS   Oral   Take 1 tablet by mouth at bedtime.         Marland Kitchen PRESCRIPTION MEDICATION      Ventolin Inhaler         . traMADol (ULTRAM) 50 MG tablet   Oral   Take 1 tablet (50 mg total) by mouth every 6 (six) hours as needed for severe pain.   15 tablet   0     Allergies Guaifenesin &  derivatives and Ibuprofen  Family History  Problem Relation Age of Onset  . Cancer Maternal Aunt 19    breast  . Cancer Paternal Aunt 4    breast  . Diabetes Paternal Grandmother   . Heart disease Paternal Grandmother     Social History Social History  Substance Use Topics  . Smoking status: Current Every Day Smoker -- 0.50 packs/day for 9 years    Types: Cigarettes  . Smokeless tobacco: Never Used  . Alcohol Use: No    Review of Systems Constitutional: No fever/chills Cardiovascular: Denies chest pain. Respiratory: Denies shortness of breath or cough. Gastrointestinal: Abdominal pain yes, cramping., nausea yes, vomitingno. Genitourinary: Dysuria no, vaginal discharge yes.. Musculoskeletal: Negative for back pain. Skin: Negative for rash. Neurological: Negative for headaches, focal weakness or numbness.  10-point ROS otherwise negative.  ____________________________________________   PHYSICAL EXAM:  VITAL SIGNS: ED Triage Vitals  Enc Vitals Group     BP 03/31/15 1330 110/69 mmHg     Pulse Rate 03/31/15 1330 101     Resp 03/31/15 1330 18     Temp 03/31/15 1330 98.2 F (36.8 C)     Temp Source 03/31/15 1330 Oral     SpO2  03/31/15 1330 98 %     Weight 03/31/15 1330 145 lb (65.772 kg)     Height 03/31/15 1330 5' (1.524 m)     Head Cir --      Peak Flow --      Pain Score 03/31/15 1331 6     Pain Loc --      Pain Edu? --      Excl. in GC? --     Constitutional: Alert and oriented. Well appearing and in no acute distress. Eyes: Conjunctivae are normal. PERRL. EOMI. Head: Atraumatic. Nose: No congestion/rhinnorhea. Mouth/Throat: Mucous membranes are moist.  Oropharynx non-erythematous. Neck: No stridor. Cardiovascular: Good peripheral circulation. Respiratory: Normal respiratory effort.  No retractions. Gastrointestinal: Soft and nontender. No distention. No abdominal bruits. Genitourinary: Pelvic exam: Malodorous discharge noted. Cervix is closed. No  blood in the vaginal vault. External exam is normal. No cervical motion tenderness detected. No adnexal tenderness on bimanual. Musculoskeletal: No extremity tenderness nor edema.  Neurologic:  Normal speech and language. No gross focal neurologic deficits are appreciated. Speech is normal. No gait instability. Skin:  Skin is warm, dry and intact. No rash noted. Psychiatric: Mood and affect are normal. Speech and behavior are normal.  ____________________________________________   LABS (all labs ordered are listed, but only abnormal results are displayed)  Labs Reviewed  CBC WITH DIFFERENTIAL/PLATELET - Abnormal; Notable for the following:    Neutro Abs 6.7 (*)    All other components within normal limits  COMPREHENSIVE METABOLIC PANEL - Abnormal; Notable for the following:    Potassium 3.4 (*)    Glucose, Bld 112 (*)    All other components within normal limits  URINALYSIS COMPLETEWITH MICROSCOPIC ([facility] ONLY) - Abnormal; Notable for the following:    Color, Urine YELLOW (*)    APPearance CLOUDY (*)    Leukocytes, UA 3+ (*)    Bacteria, UA RARE (*)    Squamous Epithelial / LPF TOO NUMEROUS TO COUNT (*)    All other components within normal limits  HCG, QUANTITATIVE, PREGNANCY - Abnormal; Notable for the following:    hCG, Beta Chain, Quant, S 53056 (*)    All other components within normal limits  WET PREP, GENITAL  CHLAMYDIA/NGC RT PCR ([facility] ONLY)   ____________________________________________  RADIOLOGY  Pending ____________________________________________   PROCEDURES  Procedure(s) performed: Pelvic exam see assessment  ____________________________________________   INITIAL IMPRESSION / ASSESSMENT AND PLAN / ED COURSE  Pertinent labs & imaging results that were available during my care of the patient were reviewed by me and considered in my medical decision making (see chart for details).  Awaiting ultrasound and vaginal specimen results. Care  transferred to Renne Musca, [facility] ____________________________________________   FINAL CLINICAL IMPRESSION(S) / ED DIAGNOSES  Final diagnoses:  None       Chinita Pester, FNP 03/31/15 1542  Arnaldo Natal, MD 03/31/15 1710

## 2015-03-31 NOTE — ED Notes (Addendum)
Patient c/o lower abdominal pain that radiates to low back. Patient states she has had a headache and went to [facility] and was told she was [redacted] weeks pregnant going by her period, but also had a positive urine pregnancy test. Patient states she has a white discharge.

## 2015-03-31 NOTE — ED Notes (Signed)
Patient given rx.

## 2015-03-31 NOTE — ED Provider Notes (Signed)
[facility] Emergency Department Provider Note  ____________________________________________  Time seen: Approximately 5:16 PM  I have reviewed the triage vital signs and the nursing notes.   HISTORY  Chief Complaint Abdominal Pain    HPI Eileen Tate is a 29 y.o. female . See note by Bettey Mare today's date for complete H&P    RADIOLOGY  Pelvic ultrasound demonstrates a viable pregnancy at 10 weeks 5 days. ____________________________________________   PROCEDURES  Procedure(s) performed: None  Critical Care performed: No  ____________________________________________   INITIAL IMPRESSION / ASSESSMENT AND PLAN / ED COURSE  Pertinent labs & imaging results that were available during my care of the patient were reviewed by me and considered in my medical decision making (see chart for details).  Acute urinary tract infection. Rx given for Keflex 500 mg 4 times a day and Tylenol No. 3 as needed for pain. Patient to follow up with PCP and GYN tomorrow as scheduled. ____________________________________________   FINAL CLINICAL IMPRESSION(S) / ED DIAGNOSES  Final diagnoses:  Urinary tract infection with hematuria, site unspecified      Evangeline Dakin, PA-C 03/31/15 1718  Minna Antis, MD 03/31/15 2315

## 2015-03-31 NOTE — ED Notes (Signed)
Patient states that IV was "pulling". IV flushed easily and had good blood return. IV removed per patient's request.

## 2015-04-27 DIAGNOSIS — Z8709 Personal history of other diseases of the respiratory system: Secondary | ICD-10-CM | POA: Insufficient documentation

## 2015-05-10 ENCOUNTER — Encounter: Payer: Self-pay | Admitting: Emergency Medicine

## 2015-05-10 ENCOUNTER — Emergency Department
Admission: EM | Admit: 2015-05-10 | Discharge: 2015-05-10 | Disposition: A | Discharge status: Home / Self Care | Payer: Medicaid Other | Attending: Emergency Medicine | Admitting: Emergency Medicine

## 2015-05-10 DIAGNOSIS — O23592 Infection of other part of genital tract in pregnancy, second trimester: Secondary | ICD-10-CM | POA: Diagnosis not present

## 2015-05-10 DIAGNOSIS — F17210 Nicotine dependence, cigarettes, uncomplicated: Secondary | ICD-10-CM | POA: Diagnosis not present

## 2015-05-10 DIAGNOSIS — N760 Acute vaginitis: Secondary | ICD-10-CM

## 2015-05-10 DIAGNOSIS — R109 Unspecified abdominal pain: Secondary | ICD-10-CM

## 2015-05-10 DIAGNOSIS — Z3A19 19 weeks gestation of pregnancy: Secondary | ICD-10-CM | POA: Diagnosis not present

## 2015-05-10 DIAGNOSIS — O9989 Other specified diseases and conditions complicating pregnancy, childbirth and the puerperium: Secondary | ICD-10-CM | POA: Diagnosis present

## 2015-05-10 DIAGNOSIS — R51 Headache: Secondary | ICD-10-CM | POA: Insufficient documentation

## 2015-05-10 DIAGNOSIS — O99332 Smoking (tobacco) complicating pregnancy, second trimester: Secondary | ICD-10-CM | POA: Insufficient documentation

## 2015-05-10 DIAGNOSIS — O26899 Other specified pregnancy related conditions, unspecified trimester: Secondary | ICD-10-CM

## 2015-05-10 DIAGNOSIS — B9689 Other specified bacterial agents as the cause of diseases classified elsewhere: Secondary | ICD-10-CM

## 2015-05-10 LAB — CBC
HEMATOCRIT: 38.4 % (ref 35.0–47.0)
HEMOGLOBIN: 13.2 g/dL (ref 12.0–16.0)
MCH: 31.7 pg (ref 26.0–34.0)
MCHC: 34.4 g/dL (ref 32.0–36.0)
MCV: 92.1 fL (ref 80.0–100.0)
PLATELETS: 216 10*3/uL (ref 150–440)
RBC: 4.17 MIL/uL (ref 3.80–5.20)
RDW: 13.1 % (ref 11.5–14.5)
WBC: 10.8 10*3/uL (ref 3.6–11.0)

## 2015-05-10 LAB — URINALYSIS COMPLETE WITH MICROSCOPIC (ARMC ONLY)
BACTERIA UA: NONE SEEN
Bilirubin Urine: NEGATIVE
GLUCOSE, UA: NEGATIVE mg/dL
Hgb urine dipstick: NEGATIVE
KETONES UR: NEGATIVE mg/dL
Nitrite: NEGATIVE
PROTEIN: NEGATIVE mg/dL
Specific Gravity, Urine: 1.021 (ref 1.005–1.030)
pH: 7 (ref 5.0–8.0)

## 2015-05-10 LAB — COMPREHENSIVE METABOLIC PANEL
ALK PHOS: 61 U/L (ref 38–126)
ALT: 7 U/L — AB (ref 14–54)
AST: 14 U/L — AB (ref 15–41)
Albumin: 3.4 g/dL — ABNORMAL LOW (ref 3.5–5.0)
Anion gap: 8 (ref 5–15)
BUN: 7 mg/dL (ref 6–20)
CALCIUM: 9 mg/dL (ref 8.9–10.3)
CO2: 23 mmol/L (ref 22–32)
CREATININE: 0.6 mg/dL (ref 0.44–1.00)
Chloride: 105 mmol/L (ref 101–111)
Glucose, Bld: 86 mg/dL (ref 65–99)
Potassium: 3.5 mmol/L (ref 3.5–5.1)
Sodium: 136 mmol/L (ref 135–145)
Total Bilirubin: 0.4 mg/dL (ref 0.3–1.2)
Total Protein: 6.6 g/dL (ref 6.5–8.1)

## 2015-05-10 LAB — CHLAMYDIA/NGC RT PCR (ARMC ONLY)
Chlamydia Tr: NOT DETECTED
N gonorrhoeae: NOT DETECTED

## 2015-05-10 LAB — WET PREP, GENITAL
Trich, Wet Prep: NONE SEEN
Yeast Wet Prep HPF POC: NONE SEEN

## 2015-05-10 MED ORDER — ONDANSETRON HCL 4 MG/2ML IJ SOLN
4.0000 mg | Freq: Once | INTRAMUSCULAR | Status: AC
  Administered 2015-05-10: 4 mg via INTRAVENOUS
  Filled 2015-05-10: qty 2

## 2015-05-10 MED ORDER — METRONIDAZOLE 500 MG PO TABS: 500.0000 mg | ORAL_TABLET | Freq: Two times a day (BID) | ORAL | Status: AC

## 2015-05-10 MED ORDER — SODIUM CHLORIDE 0.9 % IV BOLUS (SEPSIS)
1000.0000 mL | Freq: Once | INTRAVENOUS | Status: AC
  Administered 2015-05-10: 1000 mL via INTRAVENOUS

## 2015-05-10 MED ORDER — ACETAMINOPHEN 500 MG PO TABS
1000.0000 mg | ORAL_TABLET | Freq: Once | ORAL | Status: AC
  Administered 2015-05-10: 1000 mg via ORAL
  Filled 2015-05-10: qty 2

## 2015-05-10 MED ORDER — DIPHENHYDRAMINE HCL 50 MG/ML IJ SOLN: 25.0000 mg | Freq: Once | INTRAMUSCULAR | Status: DC

## 2015-05-10 MED ORDER — ONDANSETRON 4 MG PO TBDP: 4.0000 mg | ORAL_TABLET | Freq: Three times a day (TID) | ORAL | Status: DC | PRN

## 2015-05-10 NOTE — Discharge Instructions (Signed)
Please take your medications as prescribed. Please drink plenty of fluids and obtain plenty of rest. Please follow-up your OB/GYN tomorrow to discuss today's emergency department visit and your symptoms. Return to the emergency department for any worsening pain, or any other symptom personally concerning to your self.   Vaginitis Vaginitis is an inflammation of the vagina. It can happen when the normal bacteria and yeast in the vagina grow too much. There are different types. Treatment will depend on the type you have. HOME CARE  Take all medicines as told by your doctor.  Keep your vagina area clean and dry. Avoid soap. Rinse the area with water.  Avoid washing and cleaning out the vagina (douching).  Do not use tampons or have sex (intercourse) until your treatment is done.  Wipe from front to back after going to the restroom.  Wear cotton underwear.  Avoid wearing underwear while you sleep until your vaginitis is gone.  Avoid tight pants. Avoid underwear or nylons without a cotton panel.  Take off wet clothing (such as a bathing suit) as soon as you can.  Use mild, unscented products. Avoid fabric softeners and scented:  Feminine sprays.  Laundry detergents.  Tampons.  Soaps or bubble baths.  Practice safe sex and use condoms. GET HELP RIGHT AWAY IF:   You have belly (abdominal) pain.  You have a fever or lasting symptoms for more than 2-3 days.  You have a fever and your symptoms suddenly get worse. MAKE SURE YOU:   Understand these instructions.  Will watch this condition.  Will get help right away if you are not doing well or get worse.   This information is not intended to replace advice given to you by your health care provider. Make sure you discuss any questions you have with your health care provider.   Document Released: 10/13/2008 Document Revised: 04/10/2012 Document Reviewed: 12/28/2011 Elsevier Interactive Patient Education 2016 Else[employer]  Inc[employer]  Abdominal Pain, Adult Many things can cause abdominal pain. Usually, abdominal pain is not caused by a disease and will improve without treatment. It can often be observed and treated at home. Your health care provider will do a physical exam and possibly order blood tests and X-rays to help determine the seriousness of your pain. However, in many cases, more time must pass before a clear cause of the pain can be found. Before that point, your health care provider may not know if you need more testing or further treatment. HOME CARE INSTRUCTIONS Monitor your abdominal pain for any changes. The following actions may help to alleviate any discomfort you are experiencing:  Only take over-the-counter or prescription medicines as directed by your health care provider.  Do not take laxatives unless directed to do so by your health care provider.  Try a clear liquid diet (broth, tea, or water) as directed by your health care provider. Slowly move to a bland diet as tolerated. SEEK MEDICAL CARE IF:  You have unexplained abdominal pain.  You have abdominal pain associated with nausea or diarrhea.  You have pain when you urinate or have a bowel movement.  You experience abdominal pain that wakes you in the night.  You have abdominal pain that is worsened or improved by eating food.  You have abdominal pain that is worsened with eating fatty foods.  You have a fever. SEEK IMMEDIATE MEDICAL CARE IF:  Your pain does not go away within 2 hours.  You keep throwing up (vomiting).  Your pain is felt  only in portions of the abdomen, such as the right side or the left lower portion of the abdomen.  You pass bloody or black tarry stools. MAKE SURE YOU:  Understand these instructions.  Will watch your condition.  Will get help right away if you are not doing well or get worse.   This information is not intended to replace advice given to you by your health care provider. Make sure you  discuss any questions you have with your health care provider.   Document Released: 04/26/2005 Document Revised: 04/07/2015 Document Reviewed: 03/26/2013 Elsevier Interactive Patient Education [employer]

## 2015-05-10 NOTE — ED Provider Notes (Signed)
[facility] Emergency Department Provider Note  Time seen: 4:14 PM  I have reviewed the triage vital signs and the nursing notes.   HISTORY  Chief Complaint Abdominal Pain and Vaginal Discharge    HPI Eileen Tate is a 29 y.o. female 18.[redacted] weeks pregnant who presents the emergency department with lower abdominal pain, nausea and vomiting. According to the patient since last night she has had moderate dull/aching lower abdominal pain, has felt nauseated and has vomited today. States a history of nausea throughout her pregnancy but somewhat worse in the last 1-2 days. Patient called her OB/GYN who recommended she come to the emergency department for evaluation. Patient also notes for the past one week she has had white vaginal discharge or so than normal. Denies any dysuria, fever, chills, diarrhea.     Past Medical History  Diagnosis Date  . Abscess and cellulitis   . Asthma   . Tobacco use disorder   . UTI (lower urinary tract infection)   . Heart murmur     Patient Active Problem List   Diagnosis Date Noted  . Infections of genitourinary tract antepartum 01/13/2013  . LSIL (low grade squamous intraepithelial lesion) on Pap smear 12/04/2012  . Supervision of normal pregnancy 09/18/2012  . Discharge of breast 07/05/2012    Past Surgical History  Procedure Laterality Date  . No past surgeries      Current Outpatient Rx  Name  Route  Sig  Dispense  Refill  . acetaminophen (TYLENOL) 325 MG tablet   Oral   Take 2 tablets (650 mg total) by mouth every 6 (six) hours as needed.         Marland Kitchen acetaminophen-codeine (TYLENOL #2) 300-15 MG per tablet   Oral   Take 2 tablets by mouth every 4 (four) hours as needed for moderate pain.   10 tablet   0   . cyclobenzaprine (FLEXERIL) 5 MG tablet   Oral   Take 1 tablet (5 mg total) by mouth every 8 (eight) hours as needed for muscle spasms.   30 tablet   0   . levalbuterol (XOPENEX HFA) 45 MCG/ACT  inhaler   Inhalation   Inhale 1 puff into the lungs daily as needed. For shortness of breath         . Prenatal Vit-Fe Fumarate-FA (PRENATAL MULTIVITAMIN) TABS   Oral   Take 1 tablet by mouth at bedtime.         Marland Kitchen PRESCRIPTION MEDICATION      Ventolin Inhaler           Allergies Guaifenesin & derivatives and Ibuprofen  Family History  Problem Relation Age of Onset  . Cancer Maternal Aunt 19    breast  . Cancer Paternal Aunt 33    breast  . Diabetes Paternal Grandmother   . Heart disease Paternal Grandmother     Social History Social History  Substance Use Topics  . Smoking status: Current Every Day Smoker -- 0.50 packs/day for 9 years    Types: Cigarettes  . Smokeless tobacco: Never Used  . Alcohol Use: No    Review of Systems Constitutional: Negative for fever. Cardiovascular: Negative for chest pain. Respiratory: Negative for shortness of breath. Gastrointestinal: Positive lower abdominal pain, nausea and vomiting. Negative for diarrhea or constipation. Genitourinary: Negative for dysuria. Negative hematuria. Positive vaginal discharge. Musculoskeletal: Negative for back pain. Neurological: Mild headache currently. 10-point ROS otherwise negative.  ____________________________________________   PHYSICAL EXAM:  VITAL SIGNS: ED Triage  Vitals  Enc Vitals Group     BP 05/10/15 1525 101/67 mmHg     Pulse Rate 05/10/15 1525 94     Resp 05/10/15 1525 16     Temp 05/10/15 1525 98.4 F (36.9 C)     Temp Source 05/10/15 1525 Oral     SpO2 05/10/15 1525 100 %     Weight 05/10/15 1525 145 lb (65.772 kg)     Height 05/10/15 1525 5' (1.524 m)     Head Cir --      Peak Flow --      Pain Score 05/10/15 1559 6     Pain Loc --      Pain Edu? --      Excl. in GC? --     Constitutional: Alert and oriented. Well appearing and in no distress. Eyes: Normal exam ENT   Head: Normocephalic and atraumatic.   Mouth/Throat: Mucous membranes are  moist. Cardiovascular: Normal rate, regular rhythm. No murmur Respiratory: Normal respiratory effort without tachypnea nor retractions. Breath sounds are clear and equal bilaterally. No wheezes/rales/rhonchi. Gastrointestinal: Soft and nontender. No CVA tenderness. GU:  Mild amount of white/clear discharge from the cervix. Nontender bimanual exam. Closed cervix. Musculoskeletal: Nontender with normal range of motion in all extremities. No lower extremity tenderness or edema. Neurologic:  Normal speech and language. No gross focal neurologic deficits are appreciated. Speech is normal. Skin:  Skin is warm, dry and intact.  Psychiatric: Mood and affect are normal. Speech and behavior are normal. Patient exhibits appropriate insight and judgment.  ____________________________________________    INITIAL IMPRESSION / ASSESSMENT AND PLAN / ED COURSE  Pertinent labs & imaging results that were available during my care of the patient were reviewed by me and considered in my medical decision making (see chart for details).  Patient presents for lower abdominal pain since yesterday, 18.[redacted] weeks pregnant, one week of vaginal discharge. We'll check labs including urinalysis, IV hydrate, and treat nausea was Zofran. I discussed the risk and benefits of Zofran use with the patient, she is used to and prior pregnancies and she is comfortable proceeding with Zofran. We will also dose Tylenol for her headache. Pelvic exam shows minimal/mild amount of white discharge. Closed cervix, nontender bimanual exam. PH of 5 on nitrazine paper.  Labs are largely within normal limits besides bacterial vaginitis/vaginosis on wet prep. We will discharge on Flagyl, I discussed with the patient need follow-up with OB/GYN tomorrow, she is agreeable.  ____________________________________________   FINAL CLINICAL IMPRESSION(S) / ED DIAGNOSES  Lower abdominal pain Bacterial vaginitis  Minna Antis, MD 05/10/15 1756

## 2015-05-10 NOTE — ED Notes (Signed)
Physician noted fetal hr using ultrasound as 150

## 2015-05-10 NOTE — ED Notes (Signed)
Pt present to ER with abdominal pain since 6pm last night. Called Dr. And was recommended to come to the ER to be seen.6/10 pain.

## 2015-06-17 LAB — OB RESULTS CONSOLE VARICELLA ZOSTER ANTIBODY, IGG: Varicella: NON-IMMUNE/NOT IMMUNE

## 2015-06-17 LAB — OB RESULTS CONSOLE HIV ANTIBODY (ROUTINE TESTING): HIV: NONREACTIVE

## 2015-06-17 LAB — OB RESULTS CONSOLE RPR: RPR: NONREACTIVE

## 2015-07-19 ENCOUNTER — Encounter: Payer: Self-pay | Admitting: Emergency Medicine

## 2015-07-19 ENCOUNTER — Emergency Department
Admission: EM | Admit: 2015-07-19 | Discharge: 2015-07-19 | Disposition: A | Discharge status: Home / Self Care | Payer: Medicaid Other | Attending: Emergency Medicine | Admitting: Emergency Medicine

## 2015-07-19 DIAGNOSIS — B372 Candidiasis of skin and nail: Secondary | ICD-10-CM | POA: Insufficient documentation

## 2015-07-19 DIAGNOSIS — L259 Unspecified contact dermatitis, unspecified cause: Secondary | ICD-10-CM | POA: Insufficient documentation

## 2015-07-19 DIAGNOSIS — Z79899 Other long term (current) drug therapy: Secondary | ICD-10-CM | POA: Diagnosis not present

## 2015-07-19 DIAGNOSIS — R21 Rash and other nonspecific skin eruption: Secondary | ICD-10-CM | POA: Diagnosis present

## 2015-07-19 DIAGNOSIS — J40 Bronchitis, not specified as acute or chronic: Secondary | ICD-10-CM

## 2015-07-19 DIAGNOSIS — F17210 Nicotine dependence, cigarettes, uncomplicated: Secondary | ICD-10-CM | POA: Insufficient documentation

## 2015-07-19 DIAGNOSIS — J45909 Unspecified asthma, uncomplicated: Secondary | ICD-10-CM | POA: Diagnosis not present

## 2015-07-19 MED ORDER — NYSTATIN 100000 UNIT/GM EX CREA
TOPICAL_CREAM | Freq: Once | CUTANEOUS | Status: AC
  Administered 2015-07-19: 1 via TOPICAL
  Filled 2015-07-19: qty 15

## 2015-07-19 MED ORDER — ALBUTEROL SULFATE (2.5 MG/3ML) 0.083% IN NEBU
2.5000 mg | INHALATION_SOLUTION | Freq: Once | RESPIRATORY_TRACT | Status: AC
  Administered 2015-07-19: 2.5 mg via RESPIRATORY_TRACT
  Filled 2015-07-19: qty 3

## 2015-07-19 MED ORDER — NYSTATIN 100000 UNIT/GM EX CREA: 1.0000 "application " | TOPICAL_CREAM | Freq: Two times a day (BID) | CUTANEOUS | Status: DC

## 2015-07-19 MED ORDER — NYSTATIN 100000 UNIT/GM EX POWD
Freq: Once | CUTANEOUS | Status: DC
  Filled 2015-07-19: qty 15

## 2015-07-19 MED ORDER — ALBUTEROL SULFATE HFA 108 (90 BASE) MCG/ACT IN AERS: 2.0000 | INHALATION_SPRAY | Freq: Four times a day (QID) | RESPIRATORY_TRACT | Status: DC | PRN

## 2015-07-19 NOTE — ED Notes (Signed)
Patient ambulatory to triage with steady gait, without difficulty or distress noted; pt reports 6mos pregnant [facility](EDC 3/25); currently taking zpak for bronchitis and has been leaking urine with frequent coughing; now with "blisters" to thighs

## 2015-07-19 NOTE — ED Provider Notes (Signed)
[facility] Emergency Department Provider Note  ____________________________________________  Time seen: Approximately 2:10 AM  I have reviewed the triage vital signs and the nursing notes.   HISTORY  Chief Complaint Rash    HPI Eileen Tate is a 29 y.o. female who comes into the hospital today with a rash on her legs. The patient reports that she has bronchitis and when she coughs she sometimes leaks urine. She reports that she's developed a rash on her legs over the last 2 days. She reports this evening it was stinging so she took a shower and afterwards became very itchy. The patient reports that she has been trying to put Vaseline on it because she is unsure what she is able to take due to her being 6-1/2 months pregnant. The patient has been going to [facility] practice and was placed on azithromycin for her cough. She reports that she has one more day to complete. She also reports that they informed her that she is likely leaking urine whenever she coughs. The patient reports that her legs were too uncomfortable so she decided to come in and have the rash evaluated.   Past Medical History  Diagnosis Date  . Abscess and cellulitis   . Asthma   . Tobacco use disorder   . UTI (lower urinary tract infection)   . Heart murmur     Patient Active Problem List   Diagnosis Date Noted  . Infections of genitourinary tract antepartum 01/13/2013  . LSIL (low grade squamous intraepithelial lesion) on Pap smear 12/04/2012  . Supervision of normal pregnancy 09/18/2012  . Discharge of breast 07/05/2012    Past Surgical History  Procedure Laterality Date  . No past surgeries      Current Outpatient Rx  Name  Route  Sig  Dispense  Refill  . acetaminophen (TYLENOL) 325 MG tablet   Oral   Take 2 tablets (650 mg total) by mouth every 6 (six) hours as needed.         Marland Kitchen acetaminophen-codeine (TYLENOL #2) 300-15 MG per tablet   Oral   Take 2 tablets by  mouth every 4 (four) hours as needed for moderate pain.   10 tablet   0   . albuterol (PROVENTIL HFA;VENTOLIN HFA) 108 (90 BASE) MCG/ACT inhaler   Inhalation   Inhale 2 puffs into the lungs every 6 (six) hours as needed for wheezing or shortness of breath.   1 Inhaler   0   . cyclobenzaprine (FLEXERIL) 5 MG tablet   Oral   Take 1 tablet (5 mg total) by mouth every 8 (eight) hours as needed for muscle spasms.   30 tablet   0   . levalbuterol (XOPENEX HFA) 45 MCG/ACT inhaler   Inhalation   Inhale 1 puff into the lungs daily as needed. For shortness of breath         . nystatin cream (MYCOSTATIN)   Topical   Apply 1 application topically 2 (two) times daily.   30 g   0   . ondansetron (ZOFRAN ODT) 4 MG disintegrating tablet   Oral   Take 1 tablet (4 mg total) by mouth every 8 (eight) hours as needed for nausea or vomiting.   20 tablet   0   . Prenatal Vit-Fe Fumarate-FA (PRENATAL MULTIVITAMIN) TABS   Oral   Take 1 tablet by mouth at bedtime.         Marland Kitchen PRESCRIPTION MEDICATION      Ventolin Inhaler  Allergies Guaifenesin & derivatives and Ibuprofen  Family History  Problem Relation Age of Onset  . Cancer Maternal Aunt 19    breast  . Cancer Paternal Aunt 41    breast  . Diabetes Paternal Grandmother   . Heart disease Paternal Grandmother     Social History Social History  Substance Use Topics  . Smoking status: Current Every Day Smoker -- 0.50 packs/day for 9 years    Types: Cigarettes  . Smokeless tobacco: Never Used  . Alcohol Use: No    Review of Systems Constitutional: No fever/chills Eyes: No visual changes. ENT: No sore throat. Cardiovascular: Denies chest pain. Respiratory: Cough Gastrointestinal: No abdominal pain.  No nausea, no vomiting.  No diarrhea.  No constipation. Genitourinary: Negative for dysuria. Musculoskeletal: Negative for back pain. Skin:  rash. Neurological: Negative for headaches, focal weakness or  numbness.  10-point ROS otherwise negative.  ____________________________________________   PHYSICAL EXAM:  VITAL SIGNS: ED Triage Vitals  Enc Vitals Group     BP 07/19/15 0141 108/74 mmHg     Pulse Rate 07/19/15 0141 98     Resp 07/19/15 0141 18     Temp 07/19/15 0141 98.2 F (36.8 C)     Temp Source 07/19/15 0141 Oral     SpO2 07/19/15 0141 97 %     Weight 07/19/15 0141 153 lb (69.4 kg)     Height 07/19/15 0141 5' (1.524 m)     Head Cir --      Peak Flow --      Pain Score 07/19/15 0142 5     Pain Loc --      Pain Edu? --      Excl. in GC? --     Constitutional: Alert and oriented. Well appearing and in no acute distress. Eyes: Conjunctivae are normal. PERRL. EOMI. Head: Atraumatic. Nose: No congestion/rhinnorhea. Mouth/Throat: Mucous membranes are moist.  Oropharynx non-erythematous. Cardiovascular: Normal rate, regular rhythm. Grossly normal heart sounds.  Good peripheral circulation. Respiratory: Normal respiratory effort.  No retractions. Lungs CTAB. Gastrointestinal: Soft and nontender. No distention. Positive bowel sounds Musculoskeletal: No lower extremity tenderness nor edema.   Neurologic:  Normal speech and language.  Skin:  Skin is warm, dry and intact. Red rash with satellite lesions to bilateral inner thighs no warmth or tenderness to palpation Psychiatric: Mood and affect are normal.   ____________________________________________   LABS (all labs ordered are listed, but only abnormal results are displayed)  Labs Reviewed - No data to display ____________________________________________  EKG  None ____________________________________________  RADIOLOGY  None ____________________________________________   PROCEDURES  Procedure(s) performed: None  Critical Care performed: No  ____________________________________________   INITIAL IMPRESSION / ASSESSMENT AND PLAN / ED COURSE  Pertinent labs & imaging results that were available  during my care of the patient were reviewed by me and considered in my medical decision making (see chart for details).  This is a 29 year old female who comes in with a rash to her legs. Looking at the rash appears that the patient has a candidal dermatitis infection. I did give the patient some nystatin cream and I will discharge her to home. I did offer the patient to go upstairs to have her urine or discharge evaluated but she reports that she would rather follow up with her OB at a later time since this is been going on for a few weeks and her OB reassured her that it was urine and not amniotic fluid. The patient also had a mild wheeze  anteriorly when listening to her breath sounds I will give her some albuterol inhaler for home to help with her cough. The patient will be discharged home. ____________________________________________   FINAL CLINICAL IMPRESSION(S) / ED DIAGNOSES  Final diagnoses:  Candidal dermatitis  Bronchitis      Rebecka ApleyAllison P Deyana Wnuk, MD 07/19/15 0236

## 2015-07-19 NOTE — Discharge Instructions (Signed)
Cutaneous Candidiasis Cutaneous candidiasis is a condition in which there is an overgrowth of yeast (candida) on the skin. Yeast normally live on the skin, but in small enough numbers not to cause any symptoms. In certain cases, increased growth of the yeast may cause an actual yeast infection. This kind of infection usually occurs in areas of the skin that are constantly warm and moist, such as the armpits or the groin. Yeast is the most common cause of diaper rash in babies and in people who cannot control their bowel movements (incontinence). CAUSES  The fungus that most often causes cutaneous candidiasis is Candida albicans. Conditions that can increase the risk of getting a yeast infection of the skin include:  Obesity.  Pregnancy.  Diabetes.  Taking antibiotic medicine.  Taking birth control pills.  Taking steroid medicines.  Thyroid disease.  An iron or zinc deficiency.  Problems with the immune system. SYMPTOMS   Red, swollen area of the skin.  Bumps on the skin.  Itchiness. DIAGNOSIS  The diagnosis of cutaneous candidiasis is usually based on its appearance. Light scrapings of the skin may also be taken and viewed under a microscope to identify the presence of yeast. TREATMENT  Antifungal creams may be applied to the infected skin. In severe cases, oral medicines may be needed.  HOME CARE INSTRUCTIONS   Keep your skin clean and dry.  Maintain a healthy weight.  If you have diabetes, keep your blood sugar under control. SEEK IMMEDIATE MEDICAL CARE IF:  Your rash continues to spread despite treatment.  You have a fever, chills, or abdominal pain.   This information is not intended to replace advice given to you by your health care provider. Make sure you discuss any questions you have with your health care provider.   Document Released: 04/04/2011 Document Revised: 10/09/2011 Document Reviewed: 01/18/2015 Elsevier Interactive Patient Education 2016 Elsevier  Inc.  Upper Respiratory Infection, Adult Most upper respiratory infections (URIs) are a viral infection of the air passages leading to the lungs. A URI affects the nose, throat, and upper air passages. The most common type of URI is nasopharyngitis and is typically referred to as "the common cold." URIs run their course and usually go away on their own. Most of the time, a URI does not require medical attention, but sometimes a bacterial infection in the upper airways can follow a viral infection. This is called a secondary infection. Sinus and middle ear infections are common types of secondary upper respiratory infections. Bacterial pneumonia can also complicate a URI. A URI can worsen asthma and chronic obstructive pulmonary disease (COPD). Sometimes, these complications can require emergency medical care and may be life threatening.  CAUSES Almost all URIs are caused by viruses. A virus is a type of germ and can spread from one person to another.  RISKS FACTORS You may be at risk for a URI if:   You smoke.   You have chronic heart or lung disease.  You have a weakened defense (immune) system.   You are very young or very old.   You have nasal allergies or asthma.  You work in crowded or poorly ventilated areas.  You work in health care facilities or schools. SIGNS AND SYMPTOMS  Symptoms typically develop 2-3 days after you come in contact with a cold virus. Most viral URIs last 7-10 days. However, viral URIs from the influenza virus (flu virus) can last 14-18 days and are typically more severe. Symptoms may include:   Runny or  stuffy (congested) nose.   Sneezing.   Cough.   Sore throat.   Headache.   Fatigue.   Fever.   Loss of appetite.   Pain in your forehead, behind your eyes, and over your cheekbones (sinus pain).  Muscle aches.  DIAGNOSIS  Your health care provider may diagnose a URI by:  Physical exam.  Tests to check that your symptoms are not  due to another condition such as:  Strep throat.  Sinusitis.  Pneumonia.  Asthma. TREATMENT  A URI goes away on its own with time. It cannot be cured with medicines, but medicines may be prescribed or recommended to relieve symptoms. Medicines may help:  Reduce your fever.  Reduce your cough.  Relieve nasal congestion. HOME CARE INSTRUCTIONS   Take medicines only as directed by your health care provider.   Gargle warm saltwater or take cough drops to comfort your throat as directed by your health care provider.  Use a warm mist humidifier or inhale steam from a shower to increase air moisture. This may make it easier to breathe.  Drink enough fluid to keep your urine clear or pale yellow.   Eat soups and other clear broths and maintain good nutrition.   Rest as needed.   Return to work when your temperature has returned to normal or as your health care provider advises. You may need to stay home longer to avoid infecting others. You can also use a face mask and careful hand washing to prevent spread of the virus.  Increase the usage of your inhaler if you have asthma.   Do not use any tobacco products, including cigarettes, chewing tobacco, or electronic cigarettes. If you need help quitting, ask your health care provider. PREVENTION  The best way to protect yourself from getting a cold is to practice good hygiene.   Avoid oral or hand contact with people with cold symptoms.   Wash your hands often if contact occurs.  There is no clear evidence that vitamin C, vitamin E, echinacea, or exercise reduces the chance of developing a cold. However, it is always recommended to get plenty of rest, exercise, and practice good nutrition.  SEEK MEDICAL CARE IF:   You are getting worse rather than better.   Your symptoms are not controlled by medicine.   You have chills.  You have worsening shortness of breath.  You have brown or red mucus.  You have yellow or  brown nasal discharge.  You have pain in your face, especially when you bend forward.  You have a fever.  You have swollen neck glands.  You have pain while swallowing.  You have white areas in the back of your throat. SEEK IMMEDIATE MEDICAL CARE IF:   You have severe or persistent:  Headache.  Ear pain.  Sinus pain.  Chest pain.  You have chronic lung disease and any of the following:  Wheezing.  Prolonged cough.  Coughing up blood.  A change in your usual mucus.  You have a stiff neck.  You have changes in your:  Vision.  Hearing.  Thinking.  Mood. MAKE SURE YOU:   Understand these instructions.  Will watch your condition.  Will get help right away if you are not doing well or get worse.   This information is not intended to replace advice given to you by your health care provider. Make sure you discuss any questions you have with your health care provider.   Document Released: 01/10/2001 Document Revised: 12/01/2014 Document  Reviewed: 10/22/2013 Elsevier Interactive Patient Education [employer]

## 2015-08-01 NOTE — L&D Delivery Note (Signed)
Deliver Note   Date of Delivery:   10/10/2015 Primary OB:   [facility] Gestational Age/EDD: [id number] by 10/23/2015, by Other Basis  Antepartum complications:  OB History    Gravida Para Term Preterm AB TAB SAB Ectopic Multiple Living   5 4 4  0 0 0 0 0 0 4      Delivered By:   Vena AustriaStaebler, Samyukta Cura MD  Delivery Type:   TSVD Anesthesia:     Epidural  Intrapartum complications:  GBS:    Unknown Laceration:    none Episiotomy:    none Placenta:    Spontaneous Estimated Blood Loss:  200mL Baby:    Liveborn female , APGAR (1 MIN):  9 APGAR (5 MINS):  9, weight pending   Deliver Details   A viable  female infant was delivered via TSVD (Presentation: OA  ).  APGAR: 9, 9; weight  pending.   Placenta status: spontaneous, intact.  Cord: 3 vessel cord without complications.    Mom to postpartum.  Baby to Couplet care / Skin to Skin.

## 2015-08-23 ENCOUNTER — Observation Stay
Admission: EM | Admit: 2015-08-23 | Discharge: 2015-08-23 | Disposition: A | Discharge status: Home / Self Care | Payer: Medicaid Other | Attending: Obstetrics and Gynecology | Admitting: Obstetrics and Gynecology

## 2015-08-23 DIAGNOSIS — O26899 Other specified pregnancy related conditions, unspecified trimester: Secondary | ICD-10-CM | POA: Diagnosis not present

## 2015-08-23 DIAGNOSIS — Z3A00 Weeks of gestation of pregnancy not specified: Secondary | ICD-10-CM | POA: Diagnosis not present

## 2015-08-23 DIAGNOSIS — R112 Nausea with vomiting, unspecified: Secondary | ICD-10-CM | POA: Diagnosis not present

## 2015-08-23 LAB — CHLAMYDIA/NGC RT PCR (ARMC ONLY)
CHLAMYDIA TR: NOT DETECTED
N GONORRHOEAE: NOT DETECTED

## 2015-08-23 LAB — URINALYSIS COMPLETE WITH MICROSCOPIC (ARMC ONLY)
Bacteria, UA: NONE SEEN
Bilirubin Urine: NEGATIVE
Glucose, UA: NEGATIVE mg/dL
Ketones, ur: NEGATIVE mg/dL
Leukocytes, UA: NEGATIVE
Nitrite: NEGATIVE
PH: 7 (ref 5.0–8.0)
PROTEIN: NEGATIVE mg/dL
RBC / HPF: NONE SEEN RBC/hpf (ref 0–5)
Specific Gravity, Urine: 1.009 (ref 1.005–1.030)

## 2015-08-23 MED ORDER — ONDANSETRON HCL 4 MG/2ML IJ SOLN
4.0000 mg | Freq: Once | INTRAMUSCULAR | Status: AC
Start: 2015-08-23 — End: 2015-08-23
  Administered 2015-08-23: 4 mg via INTRAVENOUS
  Filled 2015-08-23: qty 2

## 2015-08-23 MED ORDER — PROMETHAZINE HCL 25 MG/ML IJ SOLN: 25.0000 mg | Freq: Four times a day (QID) | INTRAMUSCULAR | Status: DC | PRN

## 2015-08-23 MED ORDER — NITROFURANTOIN MONOHYD MACRO 100 MG PO CAPS: 100.0000 mg | ORAL_CAPSULE | Freq: Two times a day (BID) | ORAL | Status: AC

## 2015-08-23 MED ORDER — LACTATED RINGERS IV SOLN: INTRAVENOUS | Status: DC

## 2015-08-23 MED ORDER — LACTATED RINGERS IV BOLUS (SEPSIS): 1000.0000 mL | Freq: Once | INTRAVENOUS | Status: DC

## 2015-08-23 MED ORDER — PROMETHAZINE HCL 25 MG/ML IJ SOLN: 25.0000 mg | Freq: Once | INTRAMUSCULAR | Status: DC

## 2015-08-23 NOTE — Plan of Care (Signed)
Attempted to start iv with 20 jelco in right forearm. Flashback obtained but pt stated she could not stand the pain. jelco removed. Explained to pt that provider had ordered medication that required iv being started in arm rather than hand. Before starting iv, pt asking for a sandwich.  Nurse wanted to defer giving pt liquids or solids until pt received medication for nausea since pt stated she was not able to keep water down this am. j glidhill,cnm notified.

## 2015-08-23 NOTE — OB Triage Note (Signed)
G5P4 pt presents for N/V. No LOF, +FM.

## 2015-08-23 NOTE — Plan of Care (Signed)
Pt presents to l/d with c/o nausea/vomiting/ diarrhea since Saturday night. Pt receives prenatal care in [place].. [facility] in [facility]

## 2015-08-24 ENCOUNTER — Other Ambulatory Visit: Payer: Self-pay | Admitting: Advanced Practice Midwife

## 2015-08-24 NOTE — Assessment & Plan Note (Signed)
IUP at 31 weeks GI disturbance UTI   Discharge to home 08/23/15 Without restrictions Macrobid for UTI  [facility], CNM

## 2015-09-25 NOTE — Final Progress Note (Signed)
Physician Final Progress Note  Patient ID: Eileen Tate MRN: [id number] DOB/AGE: [birth year] 30 y.o.  Admit date: 08/23/2015 Admitting provider: Conard Novak, MD Discharge date: 09/25/2015   Admission Diagnoses: nausea and vomiting during pregnancy  Discharge Diagnoses:  Active Problems:   Indication for care in labor and delivery, antepartum   Significant Findings/ Diagnostic Studies:  Results for Eileen Tate (MRN [id number]) as of 09/25/2015 11:18  Ref. Range 08/23/2015 17:17  Specimen source GC/Chlam Unknown URINE, RANDOM  Chlamydia Tr Latest Ref Range: NOT DETECTED  NOT DETECTED  N gonorrhoeae Latest Ref Range: NOT DETECTED  NOT DETECTED  Appearance Latest Ref Range: CLEAR  CLEAR (A)  Bacteria, UA Latest Ref Range: NONE SEEN  NONE SEEN  Bilirubin Urine Latest Ref Range: NEGATIVE  NEGATIVE  Color, Urine Latest Ref Range: YELLOW  YELLOW (A)  Glucose Latest Ref Range: NEGATIVE mg/dL NEGATIVE  Hgb urine dipstick Latest Ref Range: NEGATIVE  1+ (A)  Ketones, ur Latest Ref Range: NEGATIVE mg/dL NEGATIVE  Leukocytes, UA Latest Ref Range: NEGATIVE  NEGATIVE  Mucous Unknown PRESENT  Nitrite Latest Ref Range: NEGATIVE  NEGATIVE  pH Latest Ref Range: 5.0-8.0  7.0  Protein Latest Ref Range: NEGATIVE mg/dL NEGATIVE  RBC / HPF Latest Ref Range: 0-5 RBC/hpf NONE SEEN  Specific Gravity, Urine Latest Ref Range: 1.005-1.030  1.009  Squamous Epithelial / LPF Latest Ref Range: NONE SEEN  6-30 (A)  WBC, UA Latest Ref Range: 0-5 WBC/hpf 0-5     Discharge Condition: Stable  Fetal Well Being: Category I  Disposition: Home  Diet: Regular  Discharge Activity: No restrictions     Medication List    ASK your doctor about these medications        acetaminophen 325 MG tablet  Commonly known as:  TYLENOL  Take 2 tablets (650 mg total) by mouth every 6 (six) hours as needed.     acetaminophen-codeine 300-15 MG tablet  Commonly known as:  TYLENOL #2  Take 2 tablets by  mouth every 4 (four) hours as needed for moderate pain.     albuterol 108 (90 Base) MCG/ACT inhaler  Commonly known as:  PROVENTIL HFA;VENTOLIN HFA  Inhale 2 puffs into the lungs every 6 (six) hours as needed for wheezing or shortness of breath.     cyclobenzaprine 5 MG tablet  Commonly known as:  FLEXERIL  Take 1 tablet (5 mg total) by mouth every 8 (eight) hours as needed for muscle spasms.     levalbuterol 45 MCG/ACT inhaler  Commonly known as:  XOPENEX HFA  Inhale 1 puff into the lungs daily as needed. For shortness of breath     nystatin cream  Commonly known as:  MYCOSTATIN  Apply 1 application topically 2 (two) times daily.     ondansetron 4 MG disintegrating tablet  Commonly known as:  ZOFRAN ODT  Take 1 tablet (4 mg total) by mouth every 8 (eight) hours as needed for nausea or vomiting.     prenatal multivitamin Tabs tablet  Take 1 tablet by mouth at bedtime.     PRESCRIPTION MEDICATION  Ventolin Inhaler         Total time spent taking care of this patient: 20 minutes   Azaria Bartell, CNM  This patient and plan were discussed with Dr Jean Rosenthal 09/25/2015

## 2015-10-09 ENCOUNTER — Inpatient Hospital Stay
Admission: EM | Admit: 2015-10-09 | Discharge: 2015-10-11 | DRG: 775 | Disposition: A | Discharge status: Home / Self Care | Payer: Medicaid Other | Attending: Obstetrics and Gynecology | Admitting: Obstetrics and Gynecology

## 2015-10-09 DIAGNOSIS — Z9181 History of falling: Secondary | ICD-10-CM

## 2015-10-09 DIAGNOSIS — O99334 Smoking (tobacco) complicating childbirth: Secondary | ICD-10-CM | POA: Diagnosis present

## 2015-10-09 DIAGNOSIS — Z3A38 38 weeks gestation of pregnancy: Secondary | ICD-10-CM

## 2015-10-09 DIAGNOSIS — Z79899 Other long term (current) drug therapy: Secondary | ICD-10-CM

## 2015-10-09 DIAGNOSIS — Z888 Allergy status to other drugs, medicaments and biological substances status: Secondary | ICD-10-CM

## 2015-10-09 DIAGNOSIS — G8929 Other chronic pain: Secondary | ICD-10-CM | POA: Diagnosis present

## 2015-10-09 DIAGNOSIS — O4290 Premature rupture of membranes, unspecified as to length of time between rupture and onset of labor, unspecified weeks of gestation: Secondary | ICD-10-CM | POA: Diagnosis present

## 2015-10-09 DIAGNOSIS — M549 Dorsalgia, unspecified: Secondary | ICD-10-CM | POA: Diagnosis present

## 2015-10-09 DIAGNOSIS — O4292 Full-term premature rupture of membranes, unspecified as to length of time between rupture and onset of labor: Principal | ICD-10-CM | POA: Diagnosis present

## 2015-10-09 DIAGNOSIS — J45909 Unspecified asthma, uncomplicated: Secondary | ICD-10-CM | POA: Diagnosis present

## 2015-10-09 LAB — COMPREHENSIVE METABOLIC PANEL
ALBUMIN: 2.5 g/dL — AB (ref 3.5–5.0)
ALT: 7 U/L — ABNORMAL LOW (ref 14–54)
AST: 16 U/L (ref 15–41)
Alkaline Phosphatase: 97 U/L (ref 38–126)
Anion gap: 3 — ABNORMAL LOW (ref 5–15)
BUN: 5 mg/dL — AB (ref 6–20)
CHLORIDE: 110 mmol/L (ref 101–111)
CO2: 20 mmol/L — AB (ref 22–32)
Calcium: 8.5 mg/dL — ABNORMAL LOW (ref 8.9–10.3)
Creatinine, Ser: 0.47 mg/dL (ref 0.44–1.00)
GFR calc Af Amer: >60 mL/min (ref >60)
Glucose, Bld: 109 mg/dL — ABNORMAL HIGH (ref 65–99)
POTASSIUM: 2.9 mmol/L — AB (ref 3.5–5.1)
SODIUM: 133 mmol/L — AB (ref 135–145)
Total Bilirubin: 0.3 mg/dL (ref 0.3–1.2)
Total Protein: 5.6 g/dL — ABNORMAL LOW (ref 6.5–8.1)

## 2015-10-09 LAB — CHLAMYDIA/NGC RT PCR (ARMC ONLY)
CHLAMYDIA TR: NOT DETECTED
N gonorrhoeae: NOT DETECTED

## 2015-10-09 LAB — URINE DRUG SCREEN, QUALITATIVE (ARMC ONLY)
AMPHETAMINES, UR SCREEN: NOT DETECTED
Barbiturates, Ur Screen: NOT DETECTED
Benzodiazepine, Ur Scrn: NOT DETECTED
COCAINE METABOLITE, UR ~~LOC~~: NOT DETECTED
Cannabinoid 50 Ng, Ur ~~LOC~~: NOT DETECTED
MDMA (ECSTASY) UR SCREEN: NOT DETECTED
Methadone Scn, Ur: NOT DETECTED
Opiate, Ur Screen: NOT DETECTED
Phencyclidine (PCP) Ur S: NOT DETECTED
TRICYCLIC, UR SCREEN: NOT DETECTED

## 2015-10-09 LAB — TYPE AND SCREEN
ABO/RH(D): A NEG
Antibody Screen: POSITIVE

## 2015-10-09 LAB — CBC
HCT: 33.7 % — ABNORMAL LOW (ref 35.0–47.0)
HEMOGLOBIN: 11.6 g/dL — AB (ref 12.0–16.0)
MCH: 31.3 pg (ref 26.0–34.0)
MCHC: 34.3 g/dL (ref 32.0–36.0)
MCV: 91.1 fL (ref 80.0–100.0)
PLATELETS: 275 10*3/uL (ref 150–440)
RBC: 3.7 MIL/uL — AB (ref 3.80–5.20)
RDW: 15.4 % — ABNORMAL HIGH (ref 11.5–14.5)
WBC: 13.7 10*3/uL — ABNORMAL HIGH (ref 3.6–11.0)

## 2015-10-09 LAB — ABO/RH: ABO/RH(D): A NEG

## 2015-10-09 MED ORDER — LACTATED RINGERS IV SOLN
INTRAVENOUS | Status: DC
  Administered 2015-10-09: 125 mL/h via INTRAVENOUS
  Administered 2015-10-10 (×2): 1000 mL via INTRAVENOUS

## 2015-10-09 MED ORDER — ONDANSETRON HCL 4 MG/2ML IJ SOLN: 4.0000 mg | Freq: Four times a day (QID) | INTRAMUSCULAR | Status: DC | PRN

## 2015-10-09 MED ORDER — TERBUTALINE SULFATE 1 MG/ML IJ SOLN: 0.2500 mg | Freq: Once | INTRAMUSCULAR | Status: DC | PRN

## 2015-10-09 MED ORDER — LACTATED RINGERS IV SOLN: 500.0000 mL | INTRAVENOUS | Status: DC | PRN

## 2015-10-09 MED ORDER — MISOPROSTOL 25 MCG QUARTER TABLET: 25.0000 ug | ORAL_TABLET | ORAL | Status: DC

## 2015-10-09 MED ORDER — OXYTOCIN BOLUS FROM INFUSION
500.0000 mL | INTRAVENOUS | Status: DC
  Administered 2015-10-10: 500 mL via INTRAVENOUS

## 2015-10-09 MED ORDER — OXYTOCIN 40 UNITS IN LACTATED RINGERS INFUSION - SIMPLE MED
2.5000 [IU]/h | INTRAVENOUS | Status: DC
  Administered 2015-10-10: 2.5 [IU]/h via INTRAVENOUS

## 2015-10-09 MED ORDER — OXYTOCIN 40 UNITS IN LACTATED RINGERS INFUSION - SIMPLE MED
1.0000 m[IU]/min | INTRAVENOUS | Status: DC
  Administered 2015-10-09: 1 m[IU]/min via INTRAVENOUS
  Filled 2015-10-09: qty 1000

## 2015-10-09 MED ORDER — LIDOCAINE HCL (PF) 1 % IJ SOLN: 30.0000 mL | INTRAMUSCULAR | Status: DC | PRN

## 2015-10-09 MED ORDER — CITRIC ACID-SODIUM CITRATE 334-500 MG/5ML PO SOLN: 30.0000 mL | ORAL | Status: DC | PRN

## 2015-10-09 MED ORDER — ACETAMINOPHEN 325 MG PO TABS: 650.0000 mg | ORAL_TABLET | ORAL | Status: DC | PRN

## 2015-10-09 NOTE — Progress Notes (Signed)
Dating criteria reviewed actually had 10 week [facility].  [facility] with tubal papers signed 10/22/15 but not available for review will attempt to obtain.

## 2015-10-09 NOTE — H&P (Signed)
Obstetric H&P   Chief Complaint: Leaking fluid  Prenatal Care Provider: [facility]  History of Present Illness: 30 y.o. [id number] [redacted]w[redacted]d by 10/23/2015 by 16 week [place], with limited prenatal care at [facility], presents after a ground level fall, no abdominal trauma reporting back and knee pain.  The back pain appears chronic based on records available from [facility].  The patient reports +FM, no VB, no contractions.  States PNC at [facility] uncomplicated.  Her prior pregnancies have all been uncomplicated vaginal deliveries at term with pelvis tested to 6lbs 8oz.  She has not history of GDM of blood pressure issues but has not done a 1-hr OGTT this pregnancy.  The patient has been absent from care since 32 weeks  A neg / rhogam received 08/12/16 / HIV neg / HBsAg neg / RPR NR / RI / VZNI / uncompleted 1-hr / unknown GBS / pap normal  TDAP received 08/13/15 Influenza received 11/17  Anatomy scan by [facility] revealed 2 weeks size discrepancy so only 16 weeks, and was suboptimatl for hear views, aoritc arch was not visualized.  No follow up anatomy scan is on file  Review of Systems: 10 point review of systems negative unless otherwise noted in HPI  Past Medical History: Past Medical History  Diagnosis Date  . Abscess and cellulitis   . Asthma   . Tobacco use disorder   . UTI (lower urinary tract infection)   . Heart murmur     Past Surgical History: Past Surgical History  Procedure Laterality Date  . No past surgeries      Family History: Family History  Problem Relation Age of Onset  . Cancer Maternal Aunt 19    breast  . Cancer Paternal Aunt 78    breast  . Diabetes Paternal Grandmother   . Heart disease Paternal Grandmother     Social History: Social History   Social History  . Marital Status: Married    Spouse Name: N/A  . Number of Children: N/A  . Years of Education: N/A   Occupational History  . Not on file.   Social History Main Topics  . Smoking status: Current Every Day Smoker -- 0.25  packs/day for 9 years    Types: Cigarettes  . Smokeless tobacco: Never Used  . Alcohol Use: No  . Drug Use: No  . Sexual Activity: Yes    Birth Control/ Protection: None   Other Topics Concern  . Not on file   Social History Narrative    Medications: Prior to Admission medications   Medication Sig Start Date End Date Taking? Authorizing Provider  acetaminophen (TYLENOL) 325 MG tablet Take 2 tablets (650 mg total) by mouth every 6 (six) hours as needed. 02/26/13  Yes Arabella Merles, CNM  albuterol (PROVENTIL HFA;VENTOLIN HFA) 108 (90 BASE) MCG/ACT inhaler Inhale 2 puffs into the lungs every 6 (six) hours as needed for wheezing or shortness of breath. 07/19/15  Yes Rebecka Apley, MD  levalbuterol [facility] HFA) 45 MCG/ACT inhaler Inhale 1 puff into the lungs daily as needed. For shortness of breath   Yes Historical Provider, MD  acetaminophen-codeine (TYLENOL #2) 300-15 MG per tablet Take 2 tablets by mouth every 4 (four) hours as needed for moderate pain. Patient not taking: Reported on 10/09/2015 03/31/15 03/30/16  Charmayne Sheer Beers, PA-C  cyclobenzaprine (FLEXERIL) 5 MG tablet Take 1 tablet (5 mg total) by mouth every 8 (eight) hours as needed for muscle spasms. Patient not taking: Reported on 10/09/2015 03/31/15  Charmayne Sheerharles M Beers, PA-C  nystatin cream (MYCOSTATIN) Apply 1 application topically 2 (two) times daily. Patient not taking: Reported on 10/09/2015 07/19/15   Rebecka ApleyAllison P Webster, MD  ondansetron (ZOFRAN ODT) 4 MG disintegrating tablet Take 1 tablet (4 mg total) by mouth every 8 (eight) hours as needed for nausea or vomiting. Patient not taking: Reported on 10/09/2015 05/10/15   Minna AntisKevin Paduchowski, MD  Prenatal Vit-Fe Fumarate-FA (PRENATAL MULTIVITAMIN) TABS Take 1 tablet by mouth at bedtime. Reported on 10/09/2015    Historical Provider, MD  PRESCRIPTION MEDICATION Reported on 10/09/2015    Historical Provider, MD    Allergies: Allergies  Allergen Reactions  . Guaifenesin &  Derivatives Swelling  . Ibuprofen Other (See Comments)    Causes blisters    Physical Exam: Vitals: Temperature 98 F (36.7 C), temperature source Oral, resp. rate 20, height 5' (1.524 m), last menstrual period 12/30/2014, SpO2 98 %, unknown if currently breastfeeding.  FHT: 140, moderate variability, positive accels, no decels - Cat I tracing Toco: absent  General: NAD HEENT: normocephalic, anicteric Pulmonary: no increased work of breathing Cardiovascular: RRR Abdomen: Gravid,  Non-tender Leopolds: vtx 7.5lbs Genitourinary: closed cervix by nursing staff exam Extremities: no edema  Positive nitrazine, negative pooling, wet mount positive ferning  Labs: No results found for this or any previous visit (from the past 24 hour(s)).  Assessment: 30 y.o. [id number] 317w0d by 10/23/2015, by 16 week US with PROM  Plan: 1) PROM - start po cytotec q4hrs at 25mcg - will obtain GBS on admission, no antibiotics unless culture returns positive OR greater than 18-hrs of rupture per 2010 CDC "Guidlines for the Prevention of Perinatal Group B Streptococcal Disease" risk based screening approach for GBS unknown.   2) Fetus -category I tracing  3) PNL  A neg / rhogam received 08/12/16 / HIV neg / HBsAg neg / RPR NR / RI / VZNI / uncompleted 1-hr / unknown GBS / pap normal  4) TDAP & influenza vaccinations up to date  5) Late entry to care and limited prenatal care - will obtain UDS , CMP to look at random BG  6) Disposition - pending delivery

## 2015-10-09 NOTE — OB Triage Note (Signed)
g5p4 fell at 1030 this morning hitting both knees and presents to hospital with c/o constant back pain and intermittent lower abdominal pain that started after the fall.  Also reports +LOF since 1555 today, clear.  + FM

## 2015-10-10 ENCOUNTER — Observation Stay: Payer: Medicaid Other | Admitting: Anesthesiology

## 2015-10-10 ENCOUNTER — Encounter: Payer: Self-pay | Admitting: Anesthesiology

## 2015-10-10 DIAGNOSIS — Z79899 Other long term (current) drug therapy: Secondary | ICD-10-CM | POA: Diagnosis not present

## 2015-10-10 DIAGNOSIS — Z3A38 38 weeks gestation of pregnancy: Secondary | ICD-10-CM | POA: Diagnosis not present

## 2015-10-10 DIAGNOSIS — J45909 Unspecified asthma, uncomplicated: Secondary | ICD-10-CM | POA: Diagnosis present

## 2015-10-10 DIAGNOSIS — M549 Dorsalgia, unspecified: Secondary | ICD-10-CM | POA: Diagnosis present

## 2015-10-10 DIAGNOSIS — O99334 Smoking (tobacco) complicating childbirth: Secondary | ICD-10-CM | POA: Diagnosis present

## 2015-10-10 DIAGNOSIS — O4292 Full-term premature rupture of membranes, unspecified as to length of time between rupture and onset of labor: Secondary | ICD-10-CM | POA: Diagnosis present

## 2015-10-10 DIAGNOSIS — Z9181 History of falling: Secondary | ICD-10-CM | POA: Diagnosis not present

## 2015-10-10 DIAGNOSIS — G8929 Other chronic pain: Secondary | ICD-10-CM | POA: Diagnosis present

## 2015-10-10 DIAGNOSIS — Z888 Allergy status to other drugs, medicaments and biological substances status: Secondary | ICD-10-CM | POA: Diagnosis not present

## 2015-10-10 MED ORDER — ACETAMINOPHEN 325 MG PO TABS: 650.0000 mg | ORAL_TABLET | ORAL | Status: DC | PRN

## 2015-10-10 MED ORDER — WITCH HAZEL-GLYCERIN EX PADS: 1.0000 "application " | MEDICATED_PAD | CUTANEOUS | Status: DC | PRN

## 2015-10-10 MED ORDER — BUPIVACAINE HCL (PF) 0.25 % IJ SOLN
INTRAMUSCULAR | Status: DC | PRN
  Administered 2015-10-10: 5 mL via EPIDURAL

## 2015-10-10 MED ORDER — DIBUCAINE 1 % RE OINT: 1.0000 "application " | TOPICAL_OINTMENT | RECTAL | Status: DC | PRN

## 2015-10-10 MED ORDER — ONDANSETRON HCL 4 MG/2ML IJ SOLN: 4.0000 mg | INTRAMUSCULAR | Status: DC | PRN

## 2015-10-10 MED ORDER — PHENYLEPHRINE 40 MCG/ML (10ML) SYRINGE FOR IV PUSH (FOR BLOOD PRESSURE SUPPORT)
80.0000 ug | PREFILLED_SYRINGE | INTRAVENOUS | Status: DC | PRN
  Filled 2015-10-10: qty 2

## 2015-10-10 MED ORDER — EPHEDRINE 5 MG/ML INJ
10.0000 mg | INTRAVENOUS | Status: DC | PRN
  Filled 2015-10-10: qty 2

## 2015-10-10 MED ORDER — DIPHENHYDRAMINE HCL 50 MG/ML IJ SOLN: 12.5000 mg | INTRAMUSCULAR | Status: DC | PRN

## 2015-10-10 MED ORDER — CHLORHEXIDINE GLUCONATE 4 % EX LIQD
1.0000 "application " | Freq: Once | CUTANEOUS | Status: AC
  Administered 2015-10-10: 1 via TOPICAL

## 2015-10-10 MED ORDER — LANOLIN HYDROUS EX OINT: TOPICAL_OINTMENT | CUTANEOUS | Status: DC | PRN

## 2015-10-10 MED ORDER — FENTANYL 2.5 MCG/ML W/ROPIVACAINE 0.2% IN NS 100 ML EPIDURAL INFUSION (ARMC-ANES)
EPIDURAL | Status: AC
  Administered 2015-10-10: 10 mL/h via EPIDURAL
  Filled 2015-10-10: qty 100

## 2015-10-10 MED ORDER — BUTORPHANOL TARTRATE 1 MG/ML IJ SOLN
INTRAMUSCULAR | Status: AC
  Administered 2015-10-10: 2 mg via INTRAVENOUS
  Filled 2015-10-10: qty 2

## 2015-10-10 MED ORDER — ZOLPIDEM TARTRATE 5 MG PO TABS: 5.0000 mg | ORAL_TABLET | Freq: Every evening | ORAL | Status: DC | PRN

## 2015-10-10 MED ORDER — TETANUS-DIPHTH-ACELL PERTUSSIS 5-2.5-18.5 LF-MCG/0.5 IM SUSP: 0.5000 mL | Freq: Once | INTRAMUSCULAR | Status: DC

## 2015-10-10 MED ORDER — ONDANSETRON HCL 4 MG PO TABS: 4.0000 mg | ORAL_TABLET | ORAL | Status: DC | PRN

## 2015-10-10 MED ORDER — SIMETHICONE 80 MG PO CHEW: 80.0000 mg | CHEWABLE_TABLET | ORAL | Status: DC | PRN

## 2015-10-10 MED ORDER — OXYCODONE-ACETAMINOPHEN 5-325 MG PO TABS
1.0000 | ORAL_TABLET | Freq: Four times a day (QID) | ORAL | Status: DC | PRN
  Administered 2015-10-10 – 2015-10-11 (×4): 1 via ORAL
  Filled 2015-10-10 (×4): qty 1

## 2015-10-10 MED ORDER — LACTATED RINGERS IV SOLN: 500.0000 mL | Freq: Once | INTRAVENOUS | Status: DC

## 2015-10-10 MED ORDER — SENNOSIDES-DOCUSATE SODIUM 8.6-50 MG PO TABS
2.0000 | ORAL_TABLET | ORAL | Status: DC
  Filled 2015-10-10: qty 2

## 2015-10-10 MED ORDER — BUTORPHANOL TARTRATE 1 MG/ML IJ SOLN
1.0000 mg | INTRAMUSCULAR | Status: DC | PRN
  Administered 2015-10-10: 2 mg via INTRAVENOUS

## 2015-10-10 MED ORDER — CHLORHEXIDINE GLUCONATE 4 % EX LIQD: 1.0000 "application " | Freq: Once | CUTANEOUS | Status: DC

## 2015-10-10 MED ORDER — BENZOCAINE-MENTHOL 20-0.5 % EX AERO
1.0000 "application " | INHALATION_SPRAY | CUTANEOUS | Status: DC | PRN
  Administered 2015-10-10: 1 via TOPICAL
  Filled 2015-10-10: qty 56

## 2015-10-10 MED ORDER — PRENATAL MULTIVITAMIN CH
1.0000 | ORAL_TABLET | Freq: Every day | ORAL | Status: DC
  Filled 2015-10-10: qty 1

## 2015-10-10 MED ORDER — DIPHENHYDRAMINE HCL 25 MG PO CAPS: 25.0000 mg | ORAL_CAPSULE | Freq: Four times a day (QID) | ORAL | Status: DC | PRN

## 2015-10-10 MED ORDER — FENTANYL 2.5 MCG/ML W/ROPIVACAINE 0.2% IN NS 100 ML EPIDURAL INFUSION (ARMC-ANES): 10.0000 mL/h | EPIDURAL | Status: DC
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
## 2015-10-10 NOTE — Progress Notes (Signed)
Education provided on need for bed/chair alarm per policy.  Pt refuses alarm and continues to call for assistance with ambulation.  Pt states "I am not a 30 year old.  I can take care of myself".  Education provided again on falls precautions and safety interventions.  Pt refuses all interventions, but yellow arm band is on pt.  Elnita Maxwellheryl, Nursing Supervisor notified, and will come to speak with pt. Reynold BowenSusan Paisley Raelee Rossmann, RN 10/10/2015 9:30 PM
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
## 2015-10-10 NOTE — Anesthesia Procedure Notes (Addendum)
Epidural Patient location during procedure: OB  Staffing Anesthesiologist: Berdine AddisonHOMAS, Gabriell Daigneault Performed by: anesthesiologist   Preanesthetic Checklist Completed: patient identified, site marked, surgical consent, pre-op evaluation, timeout performed, IV checked, risks and benefits discussed and monitors and equipment checked  Epidural Patient position: sitting Prep: Betadine Patient monitoring: heart rate, continuous pulse ox and blood pressure Approach: midline Location: L4-L5 Injection technique: LOR saline  Needle:  Needle type: Tuohy  Needle gauge: 18 G Needle length: 9 cm and 9 Catheter type: closed end flexible Catheter size: 20 Guage Test dose: negative and 1.5% lidocaine with Epi 1:200 K  Assessment Sensory level: T10 Events: blood not aspirated, injection not painful, no injection resistance, negative IV test and no paresthesia  Additional Notes   Patient tolerated the insertion well without complications.Reason for block:procedure for pain  Anesthesia Procedure Note 0325 In. 0338 catheter in. 0339 test dose. 0341 bolus. 0347 infusion start.

## 2015-10-10 NOTE — Discharge Summary (Signed)
Obstetric Discharge Summary Reason for Admission: rupture of membranes Prenatal Procedures: NST Intrapartum Procedures: spontaneous vaginal delivery Postpartum Procedures: none Complications-Operative and Postpartum: none HEMOGLOBIN  Date Value Ref Range Status  10/11/2015 10.4* 12.0 - 16.0 g/dL Final  96/04/540903/06/2012 81.114.6 12.2 - 16.2 g/dL Final   HGB  Date Value Ref Range Status  11/16/2013 13.3 12.0-16.0 g/dL Final   HCT  Date Value Ref Range Status  10/11/2015 30.4* 35.0 - 47.0 % Final  11/16/2013 40.5 35.0-47.0 % Final    Physical Exam:  General: appears stated age and no distress Lochia: appropriate Uterine Fundus: firm Incision: healing well DVT Evaluation: No evidence of DVT seen on physical exam.  Discharge Diagnoses: Term Pregnancy-delivered  Discharge Information: Date: 10/11/2015 Activity: pelvic rest Diet: routine   Medication List    STOP taking these medications        acetaminophen 325 MG tablet  Commonly known as:  TYLENOL     acetaminophen-codeine 300-15 MG tablet  Commonly known as:  TYLENOL #2     cyclobenzaprine 5 MG tablet  Commonly known as:  FLEXERIL     nystatin cream  Commonly known as:  MYCOSTATIN     ondansetron 4 MG disintegrating tablet  Commonly known as:  ZOFRAN ODT     PRESCRIPTION MEDICATION      TAKE these medications        albuterol 108 (90 Base) MCG/ACT inhaler  Commonly known as:  PROVENTIL HFA;VENTOLIN HFA  Inhale 2 puffs into the lungs every 6 (six) hours as needed for wheezing or shortness of breath.     levalbuterol 45 MCG/ACT inhaler  Commonly known as:  XOPENEX HFA  Inhale 1 puff into the lungs daily as needed. For shortness of breath     norethindrone-ethinyl estradiol 1/35 tablet  Commonly known as:  ORTHO-NOVUM 1/35 (28)  Take 1 tablet by mouth daily.  Start taking on:  10/24/2015     oxyCODONE-acetaminophen 5-325 MG tablet  Commonly known as:  ROXICET  Take 1 tablet by mouth every 4 (four) hours as  needed for moderate pain or severe pain.     prenatal multivitamin Tabs tablet  Take 1 tablet by mouth at bedtime. Reported on 10/09/2015        Condition: stable Discharge to: home Follow-up Information    Schedule an appointment as soon as possible for a visit in 4 weeks to follow up.   Why:  with [facility] providers for postpartum follow up and discussion of Bilateral Tubal Ligation      Newborn Data: Live born female  Birth Weight: 5 lb 7.5 oz (2480 g) APGAR: 9, 9  Home with mother. Bottle feeding  Contraception: Wants to start with OCP's and follow up with [facility] regarding Frutoso ChaseBTL  Russell Engelstad, CNM  This patient and plan were discussed with Dr Tiburcio PeaHarris 10/11/2015

## 2015-10-10 NOTE — Anesthesia Preprocedure Evaluation (Signed)
Anesthesia Evaluation  Patient identified by MRN, date of birth, ID band Patient awake    Reviewed: Allergy & Precautions, NPO status , Patient's Chart, lab work & pertinent test results, reviewed documented beta blocker date and time   Airway Mallampati: II  TM Distance: >3 FB     Dental  (+) Chipped   Pulmonary Current Smoker,           Cardiovascular      Neuro/Psych Anxiety    GI/Hepatic   Endo/Other    Renal/GU      Musculoskeletal   Abdominal   Peds  Hematology   Anesthesia Other Findings   Reproductive/Obstetrics                             Anesthesia Physical Anesthesia Plan  ASA: II  Anesthesia Plan: Epidural   Post-op Pain Management:    Induction:   Airway Management Planned:   Additional Equipment:   Intra-op Plan:   Post-operative Plan:   Informed Consent: I have reviewed the patients History and Physical, chart, labs and discussed the procedure including the risks, benefits and alternatives for the proposed anesthesia with the patient or authorized representative who has indicated his/her understanding and acceptance.     Plan Discussed with: CRNA  Anesthesia Plan Comments:         Anesthesia Quick Evaluation

## 2015-10-11 ENCOUNTER — Encounter
Admission: EM | Disposition: A | Discharge status: Home / Self Care | Payer: Self-pay | Source: Home / Self Care | Attending: Obstetrics and Gynecology

## 2015-10-11 LAB — POTASSIUM: Potassium: 3.6 mmol/L (ref 3.5–5.1)

## 2015-10-11 LAB — CBC
HEMATOCRIT: 30.4 % — AB (ref 35.0–47.0)
HEMOGLOBIN: 10.4 g/dL — AB (ref 12.0–16.0)
MCH: 31.2 pg (ref 26.0–34.0)
MCHC: 34.1 g/dL (ref 32.0–36.0)
MCV: 91.5 fL (ref 80.0–100.0)
PLATELETS: 197 10*3/uL (ref 150–440)
RBC: 3.32 MIL/uL — ABNORMAL LOW (ref 3.80–5.20)
RDW: 15.3 % — ABNORMAL HIGH (ref 11.5–14.5)
WBC: 11.3 10*3/uL — ABNORMAL HIGH (ref 3.6–11.0)

## 2015-10-11 LAB — RPR: RPR Ser Ql: NONREACTIVE

## 2015-10-11 SURGERY — LIGATION, FALLOPIAN TUBE, POSTPARTUM
Anesthesia: General | Laterality: Bilateral

## 2015-10-11 MED ORDER — OXYCODONE-ACETAMINOPHEN 5-325 MG PO TABS: 1.0000 | ORAL_TABLET | ORAL | Status: DC | PRN

## 2015-10-11 MED ORDER — NORETHINDRONE-ETH ESTRADIOL 1-35 MG-MCG PO TABS: 1.0000 | ORAL_TABLET | Freq: Every day | ORAL | Status: DC

## 2015-10-11 MED ORDER — VARICELLA VIRUS VACCINE LIVE 1350 PFU/0.5ML IJ SUSR
0.5000 mL | Freq: Once | INTRAMUSCULAR | Status: DC
  Filled 2015-10-11: qty 0.5

## 2015-10-11 MED ORDER — WHITE PETROLATUM GEL
Status: AC
  Filled 2015-10-11: qty 10

## 2015-10-11 NOTE — Progress Notes (Addendum)
Pt. States she does not want a Tubal Ligation this morning and wishes to reschedule as Out Patient. I called Dr. Bonney AidStaebler and informed him and he stated Pt. Would have [facility]. Pt. V/O and states she does not want the procedure today. I also talked with her about using Birth Control once she becomes Sexually active again and she V/O.

## 2015-10-11 NOTE — Progress Notes (Signed)
Patient understands all discharge instructions and the need to make follow up appointments. Patient discharge via wheelchair with auxillary. 

## 2015-10-11 NOTE — Discharge Instructions (Signed)
Bleeding: Your bleeding could continue up to 6 weeks, the flow should gradually decrease and the color should become dark then lightened over the next couple of weeks. If you notice you are bleeding heavily or passing clots larger than the size of your fist, PLEASE call your physician. No TAMPONS, DOUCHING, ENEMAS OR SEXUAL INTERCOURSE for 6 weeks.  ° °Stitches: Shower daily with mild soap and water. Stitches will dissolve over the next couple of weeks, if you experience any discomfort in the vaginal area you may sit in warm water 15-20 minutes, 3-4 times per day. Just enough water to cover vaginal area.  ° °AfterPains: This is the uterus contracting back to its normal position and size. Use medications prescribed or recommended by your physician to help relieve this discomfort.  ° °Bowels/Hemorrhoids: Drink plenty of water and stay active. Increase fiber, fresh fruits and vegetables in your diet.  ° °Rest/Activity: Rest when the baby is resting ° °Bathing: Shower daily! ° °Diet: Continue to eat extra calories until your follow up visit to help replenish nutrients and vitamins. If breastfeeding eat an extra 500-1000 and increase your fluid intake to 12 glasses a day.  ° °Contraception: Consult with your physician on what method of birth control you would like to use.  ° °Postpartum "BLUES": It is common to emotional days after delivery, however if it persist for greater than 2 weeks or if you feel concerned please let your physician know immediately. This is hormone driven and nothing you can control so please let someone know how you feel. ° °Follow Up Visit: Please schedule a follow up visit with your physician.  °

## 2015-10-11 NOTE — Anesthesia Postprocedure Evaluation (Signed)
Anesthesia Post Note  Patient: Eileen MarionSusan Ann Lento  Procedure(s) Performed: * No procedures listed *  Patient location during evaluation: Mother Baby Anesthesia Type: Epidural Level of consciousness: awake and alert Pain management: satisfactory to patient Vital Signs Assessment: post-procedure vital signs reviewed and stable Respiratory status: spontaneous breathing Cardiovascular status: blood pressure returned to baseline Postop Assessment: no headache and patient able to bend at knees Anesthetic complications: no    Last Vitals:  Filed Vitals:   10/10/15 2315 10/11/15 0407  BP: 115/63 117/71  Pulse: 68 60  Temp: 36.7 C 36.6 C  Resp: 18 20    Last Pain:  Filed Vitals:   10/11/15 0654  PainSc: 4                  Jules SchickLogan,  Shalamar Plourde P

## 2015-10-12 LAB — CULTURE, BETA STREP (GROUP B ONLY)

## 2016-04-08 ENCOUNTER — Encounter: Payer: Self-pay | Admitting: Emergency Medicine

## 2016-04-08 DIAGNOSIS — W11XXXA Fall on and from ladder, initial encounter: Secondary | ICD-10-CM | POA: Diagnosis not present

## 2016-04-08 DIAGNOSIS — F17210 Nicotine dependence, cigarettes, uncomplicated: Secondary | ICD-10-CM | POA: Insufficient documentation

## 2016-04-08 DIAGNOSIS — Y999 Unspecified external cause status: Secondary | ICD-10-CM | POA: Diagnosis not present

## 2016-04-08 DIAGNOSIS — Z5321 Procedure and treatment not carried out due to patient leaving prior to being seen by health care provider: Secondary | ICD-10-CM | POA: Insufficient documentation

## 2016-04-08 DIAGNOSIS — Y929 Unspecified place or not applicable: Secondary | ICD-10-CM | POA: Insufficient documentation

## 2016-04-08 DIAGNOSIS — Z043 Encounter for examination and observation following other accident: Secondary | ICD-10-CM | POA: Insufficient documentation

## 2016-04-08 DIAGNOSIS — Y939 Activity, unspecified: Secondary | ICD-10-CM | POA: Diagnosis not present

## 2016-04-08 DIAGNOSIS — J45909 Unspecified asthma, uncomplicated: Secondary | ICD-10-CM | POA: Diagnosis not present

## 2016-04-08 NOTE — ED Triage Notes (Signed)
Pt reports she was on 3rd step of ladder and fell, hitting left side of forehead and hurting left upper leg. Pt ambulatory to triage in NAD.

## 2016-04-09 ENCOUNTER — Emergency Department (HOSPITAL_COMMUNITY)
Admission: EM | Admit: 2016-04-09 | Discharge: 2016-04-09 | Disposition: A | Discharge status: Home / Self Care | Payer: Medicaid Other | Attending: Emergency Medicine | Admitting: Emergency Medicine

## 2016-04-09 ENCOUNTER — Encounter (HOSPITAL_COMMUNITY): Payer: Self-pay | Admitting: *Deleted

## 2016-04-09 ENCOUNTER — Emergency Department
Admission: EM | Admit: 2016-04-09 | Discharge: 2016-04-09 | Disposition: A | Discharge status: Home / Self Care | Payer: Medicaid Other | Attending: Emergency Medicine | Admitting: Emergency Medicine

## 2016-04-09 DIAGNOSIS — W11XXXA Fall on and from ladder, initial encounter: Secondary | ICD-10-CM | POA: Diagnosis not present

## 2016-04-09 DIAGNOSIS — J45909 Unspecified asthma, uncomplicated: Secondary | ICD-10-CM | POA: Diagnosis not present

## 2016-04-09 DIAGNOSIS — S80212A Abrasion, left knee, initial encounter: Secondary | ICD-10-CM | POA: Insufficient documentation

## 2016-04-09 DIAGNOSIS — Y9389 Activity, other specified: Secondary | ICD-10-CM | POA: Diagnosis not present

## 2016-04-09 DIAGNOSIS — Y999 Unspecified external cause status: Secondary | ICD-10-CM | POA: Insufficient documentation

## 2016-04-09 DIAGNOSIS — Y929 Unspecified place or not applicable: Secondary | ICD-10-CM | POA: Insufficient documentation

## 2016-04-09 DIAGNOSIS — M79605 Pain in left leg: Secondary | ICD-10-CM

## 2016-04-09 DIAGNOSIS — F17210 Nicotine dependence, cigarettes, uncomplicated: Secondary | ICD-10-CM | POA: Insufficient documentation

## 2016-04-09 DIAGNOSIS — S8992XA Unspecified injury of left lower leg, initial encounter: Secondary | ICD-10-CM | POA: Diagnosis present

## 2016-04-09 MED ORDER — OXYCODONE HCL 5 MG PO TABS
2.5000 mg | ORAL_TABLET | Freq: Once | ORAL | Status: AC
  Administered 2016-04-09: 2.5 mg via ORAL
  Filled 2016-04-09: qty 1

## 2016-04-09 MED ORDER — ACETAMINOPHEN 500 MG PO TABS
1000.0000 mg | ORAL_TABLET | Freq: Once | ORAL | Status: AC
  Administered 2016-04-09: 1000 mg via ORAL
  Filled 2016-04-09: qty 2

## 2016-04-09 NOTE — ED Triage Notes (Signed)
The pt was painting and fell off her ladder at home 3 hours ago  She struck her head and has a headache now and she has pain in her left thigh with swelling  No loc  lmp sept 1st

## 2016-04-09 NOTE — ED Notes (Signed)
This RN wasted 2.5mg  oxycodone with Suszanne ConnersBarbara RN. Pt already DC out of pyxis and unable to waste there.

## 2016-04-09 NOTE — Discharge Instructions (Signed)
  Also take tylenol 1000mg(2 extra strength) four times a day.   

## 2016-04-09 NOTE — ED Triage Notes (Signed)
[facility]  And left there after a long wait

## 2016-04-09 NOTE — ED Provider Notes (Signed)
[facility]-EMERGENCY DEPT Provider Note   CSN: [id number] Arrival date & time: 04/09/16  0104  By signing my name below, I, Phillis Haggis, attest that this documentation has been prepared under the direction and in the presence of Melene Plan, DO. Electronically Signed: Phillis Haggis, ED Scribe. 04/09/16. 2:52 AM.  History   Chief Complaint Chief Complaint  Patient presents with  . Fall   The history is provided by the patient. No language interpreter was used.  HPI Comments: Eileen Tate is a 30 y.o. female who presents to the Emergency Department complaining of gradually worsening left leg pain s/p fall onset earlier this evening. Pt was at [facility] for the same but left without being seen. She states that she was on a small step stool when she fell coming off of it. She reports the most pain to her thigh. She reports worsening pain with weight bearing and relief with propping her leg up. She has not taken anything for her pain. She denies hitting head, LOC, numbness, or weakness. Pt is allergic to ibuprofen.   Past Medical History:  Diagnosis Date  . Abscess and cellulitis   . Asthma   . Heart murmur   . Tobacco use disorder   . UTI (lower urinary tract infection)     Patient Active Problem List   Diagnosis Date Noted  . Vaginal delivery 10/10/2015  . PROM (premature rupture of membranes) 10/09/2015  . Indication for care in labor and delivery, antepartum 08/23/2015  . Infections of genitourinary tract antepartum 01/13/2013  . LSIL (low grade squamous intraepithelial lesion) on Pap smear 12/04/2012  . Supervision of normal pregnancy 09/18/2012  . Discharge of breast 07/05/2012    Past Surgical History:  Procedure Laterality Date  . NO PAST SURGERIES      OB History    Gravida Para Term Preterm AB Living   5 5 5  0 0 3   SAB TAB Ectopic Multiple Live Births   0 0 0 0 3     Home Medications    Prior to Admission medications   Medication Sig Start Date End Date  Taking? Authorizing Provider  albuterol (PROVENTIL HFA;VENTOLIN HFA) 108 (90 BASE) MCG/ACT inhaler Inhale 2 puffs into the lungs every 6 (six) hours as needed for wheezing or shortness of breath. 07/19/15   Rebecka Apley, MD  levalbuterol [facility] HFA) 45 MCG/ACT inhaler Inhale 1 puff into the lungs daily as needed. For shortness of breath    Historical Provider, MD  norethindrone-ethinyl estradiol 1/35 (ORTHO-NOVUM 1/35, 28,) tablet Take 1 tablet by mouth daily. 10/24/15   Tresea Mall, CNM  oxyCODONE-acetaminophen (ROXICET) 5-325 MG tablet Take 1 tablet by mouth every 4 (four) hours as needed for moderate pain or severe pain. 10/11/15   Tresea Mall, CNM  Prenatal Vit-Fe Fumarate-FA (PRENATAL MULTIVITAMIN) TABS Take 1 tablet by mouth at bedtime. Reported on 10/09/2015    Historical Provider, MD    Family History Family History  Problem Relation Age of Onset  . Cancer Maternal Aunt 19    breast  . Cancer Paternal Aunt 74    breast  . Diabetes Paternal Grandmother   . Heart disease Paternal Grandmother     Social History Social History  Substance Use Topics  . Smoking status: Current Every Day Smoker    Packs/day: 0.25    Years: 9.00    Types: Cigarettes  . Smokeless tobacco: Never Used  . Alcohol use 0.6 oz/week    1 Cans of beer per  week     Allergies   Guaifenesin & derivatives and Ibuprofen   Review of Systems Review of Systems  Constitutional: Negative for chills and fever.  HENT: Negative for congestion and rhinorrhea.   Eyes: Negative for redness and visual disturbance.  Respiratory: Negative for shortness of breath and wheezing.   Cardiovascular: Negative for chest pain and palpitations.  Gastrointestinal: Negative for nausea and vomiting.  Genitourinary: Negative for dysuria and urgency.  Musculoskeletal: Positive for arthralgias. Negative for back pain, myalgias and neck pain.  Skin: Negative for pallor and wound.  Neurological: Negative for dizziness,  syncope, weakness, numbness and headaches.   Physical Exam Updated Vital Signs BP 109/75   Pulse 93   Temp 98.6 F (37 C)   Resp 16   Ht 4\' 11"  (1.499 m)   Wt 150 lb (68 kg)   LMP 03/31/2016   SpO2 98%   BMI 30.30 kg/m   Physical Exam  Constitutional: She is oriented to person, place, and time. She appears well-developed and well-nourished. No distress.  HENT:  Head: Normocephalic and atraumatic.  Eyes: EOM are normal. Pupils are equal, round, and reactive to light.  Neck: Normal range of motion. Neck supple.  Cardiovascular: Normal rate and regular rhythm.  Exam reveals no gallop and no friction rub.   No murmur heard. Pulmonary/Chest: Effort normal. She has no wheezes. She has no rales.  Abdominal: Soft. She exhibits no distension. There is no tenderness.  Musculoskeletal: She exhibits no edema or tenderness.  Very small abrasion to posterior aspect of left knee; no ligamentous instability; pulse, motor, and sensation intact  Neurological: She is alert and oriented to person, place, and time.  Skin: Skin is warm and dry. She is not diaphoretic.  Psychiatric: She has a normal mood and affect. Her behavior is normal.  Nursing note and vitals reviewed.    ED Treatments / Results  DIAGNOSTIC STUDIES: Oxygen Saturation is 98% on RA, normal by my interpretation.    COORDINATION OF CARE: 2:51 AM-Discussed treatment plan which includes knee immobilizer and crutches with pt at bedside and pt agreed to plan.    Labs (all labs ordered are listed, but only abnormal results are displayed) Labs Reviewed - No data to display  EKG  EKG Interpretation None       Radiology No results found.  Procedures Procedures (including critical care time)  Medications Ordered in ED Medications  acetaminophen (TYLENOL) tablet 1,000 mg (not administered)  oxyCODONE (Oxy IR/ROXICODONE) immediate release tablet 2.5 mg (not administered)     Initial Impression / Assessment and Plan  / ED Course  I have reviewed the triage vital signs and the nursing notes.  Pertinent labs & imaging results that were available during my care of the patient were reviewed by me and considered in my medical decision making (see chart for details).  Clinical Course    30 yo F With a chief complaints of a fall from a step stool while painting. Patient landed on her left leg. Having pain about the attachment of the abductor to the knee. No noted swelling to the knee. No pain with range of motion. No ligamentous laxity. Patient has been able to ambulate on this I highly doubt that the patient has fractured her femur. I offered imaging and the patient declined. We'll place her in a knee sleeve give her crutches PCP follow-up.  Final Clinical Impressions(s) / ED Diagnoses   Final diagnoses:  Left leg pain  I personally performed the  services described in this documentation, which was scribed in my presence. The recorded information has been reviewed and is accurate.    New Prescriptions New Prescriptions   No medications on file     Melene PlanDan Celines Femia, DO 04/09/16 0304

## 2016-04-09 NOTE — ED Notes (Signed)
EDP at bedside  

## 2016-05-07 ENCOUNTER — Encounter (HOSPITAL_COMMUNITY): Payer: Self-pay | Admitting: *Deleted

## 2016-05-07 ENCOUNTER — Encounter: Payer: Self-pay | Admitting: Emergency Medicine

## 2016-05-07 ENCOUNTER — Emergency Department
Admission: EM | Admit: 2016-05-07 | Discharge: 2016-05-07 | Disposition: A | Discharge status: Home / Self Care | Payer: Medicaid Other | Attending: Emergency Medicine | Admitting: Emergency Medicine

## 2016-05-07 ENCOUNTER — Emergency Department (HOSPITAL_COMMUNITY)
Admission: EM | Admit: 2016-05-07 | Discharge: 2016-05-07 | Disposition: A | Discharge status: Home / Self Care | Payer: Medicaid Other | Attending: Emergency Medicine | Admitting: Emergency Medicine

## 2016-05-07 DIAGNOSIS — F17210 Nicotine dependence, cigarettes, uncomplicated: Secondary | ICD-10-CM | POA: Diagnosis not present

## 2016-05-07 DIAGNOSIS — J45909 Unspecified asthma, uncomplicated: Secondary | ICD-10-CM | POA: Insufficient documentation

## 2016-05-07 DIAGNOSIS — Y999 Unspecified external cause status: Secondary | ICD-10-CM | POA: Diagnosis not present

## 2016-05-07 DIAGNOSIS — T2102XD Burn of unspecified degree of abdominal wall, subsequent encounter: Secondary | ICD-10-CM | POA: Diagnosis not present

## 2016-05-07 DIAGNOSIS — Y929 Unspecified place or not applicable: Secondary | ICD-10-CM | POA: Insufficient documentation

## 2016-05-07 DIAGNOSIS — T2122XA Burn of second degree of abdominal wall, initial encounter: Secondary | ICD-10-CM | POA: Insufficient documentation

## 2016-05-07 DIAGNOSIS — X12XXXA Contact with other hot fluids, initial encounter: Secondary | ICD-10-CM | POA: Diagnosis not present

## 2016-05-07 DIAGNOSIS — Y939 Activity, unspecified: Secondary | ICD-10-CM | POA: Insufficient documentation

## 2016-05-07 DIAGNOSIS — X110XXD Contact with hot water in bath or tub, subsequent encounter: Secondary | ICD-10-CM | POA: Insufficient documentation

## 2016-05-07 DIAGNOSIS — T300 Burn of unspecified body region, unspecified degree: Secondary | ICD-10-CM

## 2016-05-07 MED ORDER — TRAMADOL HCL 50 MG PO TABS
50.0000 mg | ORAL_TABLET | Freq: Once | ORAL | Status: AC
  Administered 2016-05-07: 50 mg via ORAL
  Filled 2016-05-07: qty 1

## 2016-05-07 MED ORDER — SILVER SULFADIAZINE 1 % EX CREA: 1.0000 "application " | TOPICAL_CREAM | Freq: Every day | CUTANEOUS | 0 refills | Status: DC

## 2016-05-07 MED ORDER — SILVER SULFADIAZINE 1 % EX CREA
TOPICAL_CREAM | Freq: Once | CUTANEOUS | Status: AC
  Administered 2016-05-07: 04:00:00 via TOPICAL
  Filled 2016-05-07: qty 85
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
## 2016-05-07 NOTE — ED Notes (Signed)
Pt approached front desk, states she is leaving.  Pt informed 2 pt's ahead of her in waiting room, however unable to give specific wait time estimate.  Pt informed she can return later or go to urgent care in the morning.  Pt verbalized understanding of options.
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
## 2016-06-11 ENCOUNTER — Emergency Department
Admission: EM | Admit: 2016-06-11 | Discharge: 2016-06-11 | Disposition: A | Discharge status: Home / Self Care | Payer: Medicaid Other | Attending: Emergency Medicine | Admitting: Emergency Medicine

## 2016-06-11 ENCOUNTER — Encounter: Payer: Self-pay | Admitting: Emergency Medicine

## 2016-06-11 DIAGNOSIS — Y999 Unspecified external cause status: Secondary | ICD-10-CM | POA: Diagnosis not present

## 2016-06-11 DIAGNOSIS — F17210 Nicotine dependence, cigarettes, uncomplicated: Secondary | ICD-10-CM | POA: Insufficient documentation

## 2016-06-11 DIAGNOSIS — S0990XA Unspecified injury of head, initial encounter: Secondary | ICD-10-CM | POA: Diagnosis not present

## 2016-06-11 DIAGNOSIS — T148XXA Other injury of unspecified body region, initial encounter: Secondary | ICD-10-CM

## 2016-06-11 DIAGNOSIS — Y9389 Activity, other specified: Secondary | ICD-10-CM | POA: Diagnosis not present

## 2016-06-11 DIAGNOSIS — Z79899 Other long term (current) drug therapy: Secondary | ICD-10-CM | POA: Insufficient documentation

## 2016-06-11 DIAGNOSIS — J45909 Unspecified asthma, uncomplicated: Secondary | ICD-10-CM | POA: Diagnosis not present

## 2016-06-11 DIAGNOSIS — Y929 Unspecified place or not applicable: Secondary | ICD-10-CM | POA: Insufficient documentation

## 2016-06-11 DIAGNOSIS — S46811A Strain of other muscles, fascia and tendons at shoulder and upper arm level, right arm, initial encounter: Secondary | ICD-10-CM | POA: Insufficient documentation

## 2016-06-11 DIAGNOSIS — S4991XA Unspecified injury of right shoulder and upper arm, initial encounter: Secondary | ICD-10-CM | POA: Diagnosis present

## 2016-06-11 MED ORDER — TRAMADOL HCL 50 MG PO TABS: 50.0000 mg | ORAL_TABLET | Freq: Four times a day (QID) | ORAL | 0 refills | Status: DC | PRN
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
## 2016-06-11 NOTE — ED Notes (Signed)
Pt alert and oriented X4, active, cooperative, pt in NAD. RR even and unlabored, color WNL.  Pt informed to return if any life threatening symptoms occur.   
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
## 2016-06-13 ENCOUNTER — Encounter: Payer: Self-pay | Admitting: *Deleted

## 2016-06-13 ENCOUNTER — Emergency Department
Admission: EM | Admit: 2016-06-13 | Discharge: 2016-06-13 | Disposition: A | Discharge status: Home / Self Care | Payer: Medicaid Other | Attending: Emergency Medicine | Admitting: Emergency Medicine

## 2016-06-13 ENCOUNTER — Emergency Department: Payer: Medicaid Other

## 2016-06-13 DIAGNOSIS — R51 Headache: Secondary | ICD-10-CM | POA: Diagnosis not present

## 2016-06-13 DIAGNOSIS — Y999 Unspecified external cause status: Secondary | ICD-10-CM | POA: Diagnosis not present

## 2016-06-13 DIAGNOSIS — S0990XA Unspecified injury of head, initial encounter: Secondary | ICD-10-CM | POA: Diagnosis present

## 2016-06-13 DIAGNOSIS — Z791 Long term (current) use of non-steroidal anti-inflammatories (NSAID): Secondary | ICD-10-CM | POA: Diagnosis not present

## 2016-06-13 DIAGNOSIS — F17210 Nicotine dependence, cigarettes, uncomplicated: Secondary | ICD-10-CM | POA: Insufficient documentation

## 2016-06-13 DIAGNOSIS — Y9389 Activity, other specified: Secondary | ICD-10-CM | POA: Diagnosis not present

## 2016-06-13 DIAGNOSIS — S40011A Contusion of right shoulder, initial encounter: Secondary | ICD-10-CM | POA: Insufficient documentation

## 2016-06-13 DIAGNOSIS — J45909 Unspecified asthma, uncomplicated: Secondary | ICD-10-CM | POA: Diagnosis not present

## 2016-06-13 DIAGNOSIS — M25511 Pain in right shoulder: Secondary | ICD-10-CM

## 2016-06-13 DIAGNOSIS — R519 Headache, unspecified: Secondary | ICD-10-CM

## 2016-06-13 DIAGNOSIS — Y929 Unspecified place or not applicable: Secondary | ICD-10-CM | POA: Insufficient documentation
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
## 2016-06-13 MED ORDER — TRAMADOL HCL 50 MG PO TABS
50.0000 mg | ORAL_TABLET | Freq: Once | ORAL | Status: AC
  Administered 2016-06-13: 50 mg via ORAL
  Filled 2016-06-13: qty 1
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
## 2016-07-31 NOTE — L&D Delivery Note (Signed)
Delivery Note At 8:35 AM a viable and healthy female "Eileen Tate" was delivered via Vaginal, Spontaneous Delivery (Presentation: DOA).  APGAR: 8, 9; weight 5 lb 0.8 oz (2290 g).   Placenta status: spontaneously, intact .  Cord: 3vc with the following complications - none Anesthesia:  Epidural  Episiotomy: None Lacerations:  none Est. Blood Loss (mL): 100  Mom to postpartum.  Baby to Couplet care / Skin to Skin.  31yo [id number] at 38+4wks admitted with PROM and inducted with cytotec and pitocin. GBS neg. 1 prenatal visit prior to today. She progressed to fully dilated with an epidural, pushed once for head and a tight nuchal cord was clamped and cut on the perineum. The rest of the body delivered easy. No tears. Placenta spontaneously delivered, fundus firm. Baby placed on maternal abdomen. Bleeding minimal.   Eileen Tate 04/28/2017, 9:14 AM

## 2016-08-31 ENCOUNTER — Emergency Department
Admission: EM | Admit: 2016-08-31 | Discharge: 2016-08-31 | Disposition: A | Discharge status: Home / Self Care | Payer: Medicaid Other | Attending: Emergency Medicine | Admitting: Emergency Medicine

## 2016-08-31 ENCOUNTER — Emergency Department: Payer: Medicaid Other

## 2016-08-31 ENCOUNTER — Encounter: Payer: Self-pay | Admitting: Emergency Medicine

## 2016-08-31 DIAGNOSIS — J45909 Unspecified asthma, uncomplicated: Secondary | ICD-10-CM | POA: Insufficient documentation

## 2016-08-31 DIAGNOSIS — R251 Tremor, unspecified: Secondary | ICD-10-CM | POA: Diagnosis not present

## 2016-08-31 DIAGNOSIS — R079 Chest pain, unspecified: Secondary | ICD-10-CM | POA: Insufficient documentation

## 2016-08-31 DIAGNOSIS — Z79899 Other long term (current) drug therapy: Secondary | ICD-10-CM | POA: Insufficient documentation

## 2016-08-31 DIAGNOSIS — F17210 Nicotine dependence, cigarettes, uncomplicated: Secondary | ICD-10-CM | POA: Insufficient documentation

## 2016-08-31 LAB — BASIC METABOLIC PANEL
ANION GAP: 8 (ref 5–15)
BUN: 9 mg/dL (ref 6–20)
CALCIUM: 9.2 mg/dL (ref 8.9–10.3)
CO2: 25 mmol/L (ref 22–32)
Chloride: 105 mmol/L (ref 101–111)
Creatinine, Ser: 0.89 mg/dL (ref 0.44–1.00)
Glucose, Bld: 74 mg/dL (ref 65–99)
POTASSIUM: 4.5 mmol/L (ref 3.5–5.1)
Sodium: 138 mmol/L (ref 135–145)

## 2016-08-31 LAB — CBC
HEMATOCRIT: 45.2 % (ref 35.0–47.0)
HEMOGLOBIN: 15.3 g/dL (ref 12.0–16.0)
MCH: 31.7 pg (ref 26.0–34.0)
MCHC: 33.8 g/dL (ref 32.0–36.0)
MCV: 94 fL (ref 80.0–100.0)
Platelets: 267 10*3/uL (ref 150–440)
RBC: 4.81 MIL/uL (ref 3.80–5.20)
RDW: 14 % (ref 11.5–14.5)
WBC: 8.7 10*3/uL (ref 3.6–11.0)

## 2016-08-31 LAB — TROPONIN I

## 2016-08-31 MED ORDER — LORAZEPAM 1 MG PO TABS
1.0000 mg | ORAL_TABLET | Freq: Once | ORAL | Status: AC
  Administered 2016-08-31: 1 mg via ORAL

## 2016-08-31 MED ORDER — LORAZEPAM 1 MG PO TABS
ORAL_TABLET | ORAL | Status: AC
  Filled 2016-08-31: qty 1

## 2016-08-31 NOTE — ED Provider Notes (Addendum)
[facility] Emergency Department Provider Note  ____________________________________________   First MD Initiated Contact with Patient 08/31/16 1717     (approximate)  I have reviewed the triage vital signs and the nursing notes.   HISTORY  Chief Complaint Chest Pain and Tremors   HPI Eileen Tate is a 31 y.o. female with a history of multiple episodes of chest pain along with tremors associated with panic and stress was presenting to the emergency department today with chest pain as well as tremors that started at work at about 1:30 this afternoon. The patient says that she is having a disagreement with her supervisor and the corporate office has become involved. She says that she also doesn't the family recently and her phone is been turned off and she was not able to be quickly informed about the death as well. She says that she has been very stressed and panicked and this is when the chest pain with tremor start. He says the chest pain was sharp and lasting about 45 minutes at a time onto the left side of her chest. She says that she had associated shortness of breath but no nausea or vomiting. She says that she has had similar symptoms in the past under sterile circumstances. She is denying any chest pain at this time and says the chest pain lasted for about an hour before debated. However, she says that the tremulousness is continuing and that she had been prescribed Klonopin in the past which helped with the symptoms. She does not report any suicidal or homicidal ideation.Patient says that she smokes about a half-pack per day of cigarettes. Strong family history of heart disease with multiple family members who have died at or around age 53. However, she does not report any history of cardiac disease in her mother father any siblings. Denies alcoholism. Drinks occasionally only.   Past Medical History:  Diagnosis Date  . Abscess and cellulitis   .  Asthma   . Heart murmur   . Tobacco use disorder   . UTI (lower urinary tract infection)     Patient Active Problem List   Diagnosis Date Noted  . Vaginal delivery 10/10/2015  . PROM (premature rupture of membranes) 10/09/2015  . Indication for care in labor and delivery, antepartum 08/23/2015  . Infections of genitourinary tract antepartum 01/13/2013  . LSIL (low grade squamous intraepithelial lesion) on Pap smear 12/04/2012  . Supervision of normal pregnancy 09/18/2012  . Discharge of breast 07/05/2012    Past Surgical History:  Procedure Laterality Date  . NO PAST SURGERIES      Prior to Admission medications   Medication Sig Start Date End Date Taking? Authorizing Provider  albuterol (PROVENTIL HFA;VENTOLIN HFA) 108 (90 BASE) MCG/ACT inhaler Inhale 2 puffs into the lungs every 6 (six) hours as needed for wheezing or shortness of breath. 07/19/15   Rebecka Apley, MD  levalbuterol [facility] HFA) 45 MCG/ACT inhaler Inhale 1 puff into the lungs daily as needed. For shortness of breath    Historical Provider, MD  norethindrone-ethinyl estradiol 1/35 (ORTHO-NOVUM 1/35, 28,) tablet Take 1 tablet by mouth daily. 10/24/15   Tresea Mall, CNM  oxyCODONE-acetaminophen (ROXICET) 5-325 MG tablet Take 1 tablet by mouth every 4 (four) hours as needed for moderate pain or severe pain. 10/11/15   Tresea Mall, CNM  Prenatal Vit-Fe Fumarate-FA (PRENATAL MULTIVITAMIN) TABS Take 1 tablet by mouth at bedtime. Reported on 10/09/2015    Historical Provider, MD  silver sulfADIAZINE (SILVADENE)  1 % cream Apply 1 application topically daily. 05/07/16   Tiffany Neva SeatGreene, PA-C  traMADol (ULTRAM) 50 MG tablet Take 1 tablet (50 mg total) by mouth every 6 (six) hours as needed. 06/11/16   Jene Everyobert Kinner, MD    Allergies Guaifenesin & derivatives and Ibuprofen  Family History  Problem Relation Age of Onset  . Cancer Maternal Aunt 19    breast  . Cancer Paternal Aunt 2147    breast  . Diabetes Paternal  Grandmother   . Heart disease Paternal Grandmother     Social History Social History  Substance Use Topics  . Smoking status: Current Every Day Smoker    Packs/day: 0.25    Years: 9.00    Types: Cigarettes  . Smokeless tobacco: Never Used  . Alcohol use 0.6 oz/week    1 Cans of beer per week    Review of Systems Constitutional: No fever/chills Eyes: No visual changes. ENT: No sore throat. Cardiovascular: As above Respiratory:as above Gastrointestinal: No abdominal pain.  No nausea, no vomiting.  No diarrhea.  No constipation. Genitourinary: Negative for dysuria. Musculoskeletal: Negative for back pain. Skin: Negative for rash. Neurological: Negative for headaches, focal weakness or numbness.  10-point ROS otherwise negative.  ____________________________________________   PHYSICAL EXAM:  VITAL SIGNS: ED Triage Vitals [08/31/16 1547]  Enc Vitals Group     BP      Pulse      Resp      Temp      Temp src      SpO2      Weight      Height      Head Circumference      Peak Flow      Pain Score 3     Pain Loc      Pain Edu?      Excl. in GC?     Constitutional: Alert and oriented. Well appearing and in no acute distress. Eyes: Conjunctivae are normal. PERRL. EOMI. Head: Atraumatic. Nose: No congestion/rhinnorhea. Mouth/Throat: Mucous membranes are moist.   Neck: No stridor.   Cardiovascular: Normal rate, regular rhythm. Grossly normal heart sounds.   Respiratory: Normal respiratory effort.  No retractions. Lungs CTAB. Gastrointestinal: Soft and nontender. No distention. No abdominal bruits. No CVA tenderness. Musculoskeletal: No lower extremity tenderness nor edema.  No joint effusions. Neurologic:  Normal speech and language. No gross focal neurologic deficits are appreciated. No gait instability. Skin:  Skin is warm, dry and intact. No rash noted. Psychiatric: Mood and affect are normal. Speech and behavior are  normal.  ____________________________________________   LABS (all labs ordered are listed, but only abnormal results are displayed)  Labs Reviewed  BASIC METABOLIC PANEL  CBC  TROPONIN I   ____________________________________________  EKG  ED ECG REPORT I, Schaevitz,  Teena Iraniavid M, the attending physician, personally viewed and interpreted this ECG.   Date: 08/31/2016  EKG Time: 1542  Rate: 93  Rhythm: normal sinus rhythm  Axis: normal  Intervals:none  ST&T Change: No ST segment elevation or depression. Possible mild biphasic appearance to the T waves in V4 through V6. Similar appearance to multiple EKGs especially that from 09/17/2012.  ____________________________________________  RADIOLOGY  DG Chest 2 View (Final result)  Result time 08/31/16 16:17:52  Final result by Corky SoxArthur Maynard, MD (08/31/16 16:17:52)           Narrative:   CLINICAL DATA: complaints of new onset centralized chest pain and tremors today while at work, smoker  EXAM: CHEST 2 VIEW  COMPARISON: Chest x-ray dated 12/07/2014.  FINDINGS: Study is slightly hypoinspiratory with crowding of the perihilar and bibasilar bronchovascular markings. Given the low lung volumes, lungs are clear. No evidence of pneumonia. No pleural effusion or pneumothorax seen. Heart size and mediastinal contours are normal. Osseous structures about the chest are unremarkable.  IMPRESSION: Low lung volumes. No active cardiopulmonary disease. No evidence of pneumonia or pulmonary edema.   Electronically Signed By: Bary Richard M.D. On: 08/31/2016 16:17          ____________________________________________   PROCEDURES  Procedure(s) performed:   Procedures  Critical Care performed:   ____________________________________________   INITIAL IMPRESSION / ASSESSMENT AND PLAN / ED COURSE  Pertinent labs & imaging results that were available during my care of the patient were reviewed by me and  considered in my medical decision making (see chart for details).  Patient with a heart score of 2. Symptoms likely related to anxiety. Chest pain-free at this time. Very fine tremor. We'll give dose of Ativan. Because of family history the patient will be following up with cardiology as well a primary care doctor for her likely anxiety issues.      ____________________________________________   FINAL CLINICAL IMPRESSION(S) / ED DIAGNOSES  Chest Pain. Tremor.    NEW MEDICATIONS STARTED DURING THIS VISIT:  New Prescriptions   No medications on file     Note:  This document was prepared using Dragon voice recognition software and may include unintentional dictation errors.    Myrna Blazer, MD 08/31/16 1822  PERC negative.     Myrna Blazer, MD 08/31/16 [phone]

## 2016-08-31 NOTE — ED Triage Notes (Signed)
Pt in via POV with complaints of new onset centralized chest pain and tremors today while at work.  Pt denies any associated symptoms.  Pt reports having a stressful week and recently losing a family member.  Pt denies hx of anxiety.  NAD noted at this time.

## 2016-10-10 ENCOUNTER — Encounter: Payer: Self-pay | Admitting: Emergency Medicine

## 2016-10-10 ENCOUNTER — Emergency Department
Admission: EM | Admit: 2016-10-10 | Discharge: 2016-10-10 | Disposition: A | Discharge status: Home / Self Care | Payer: Medicaid Other | Attending: Emergency Medicine | Admitting: Emergency Medicine

## 2016-10-10 ENCOUNTER — Emergency Department: Payer: Medicaid Other

## 2016-10-10 DIAGNOSIS — R52 Pain, unspecified: Secondary | ICD-10-CM

## 2016-10-10 DIAGNOSIS — O26891 Other specified pregnancy related conditions, first trimester: Secondary | ICD-10-CM | POA: Diagnosis present

## 2016-10-10 DIAGNOSIS — J45909 Unspecified asthma, uncomplicated: Secondary | ICD-10-CM | POA: Diagnosis not present

## 2016-10-10 DIAGNOSIS — F17210 Nicotine dependence, cigarettes, uncomplicated: Secondary | ICD-10-CM | POA: Insufficient documentation

## 2016-10-10 DIAGNOSIS — R109 Unspecified abdominal pain: Secondary | ICD-10-CM

## 2016-10-10 DIAGNOSIS — O99331 Smoking (tobacco) complicating pregnancy, first trimester: Secondary | ICD-10-CM | POA: Diagnosis not present

## 2016-10-10 DIAGNOSIS — Z3A08 8 weeks gestation of pregnancy: Secondary | ICD-10-CM | POA: Insufficient documentation

## 2016-10-10 LAB — URINALYSIS, COMPLETE (UACMP) WITH MICROSCOPIC
BACTERIA UA: NONE SEEN
BILIRUBIN URINE: NEGATIVE
Glucose, UA: NEGATIVE mg/dL
Hgb urine dipstick: NEGATIVE
Ketones, ur: NEGATIVE mg/dL
Nitrite: NEGATIVE
Protein, ur: NEGATIVE mg/dL
SPECIFIC GRAVITY, URINE: 1.012 (ref 1.005–1.030)
pH: 7 (ref 5.0–8.0)

## 2016-10-10 LAB — COMPREHENSIVE METABOLIC PANEL
ALT: 18 U/L (ref 14–54)
AST: 25 U/L (ref 15–41)
Albumin: 3.7 g/dL (ref 3.5–5.0)
Alkaline Phosphatase: 62 U/L (ref 38–126)
Anion gap: 5 (ref 5–15)
BUN: 7 mg/dL (ref 6–20)
CHLORIDE: 104 mmol/L (ref 101–111)
CO2: 24 mmol/L (ref 22–32)
Calcium: 9.1 mg/dL (ref 8.9–10.3)
Creatinine, Ser: 0.77 mg/dL (ref 0.44–1.00)
Glucose, Bld: 90 mg/dL (ref 65–99)
POTASSIUM: 3.5 mmol/L (ref 3.5–5.1)
SODIUM: 133 mmol/L — AB (ref 135–145)
Total Bilirubin: 0.4 mg/dL (ref 0.3–1.2)
Total Protein: 6.9 g/dL (ref 6.5–8.1)

## 2016-10-10 LAB — CBC
HEMATOCRIT: 42.2 % (ref 35.0–47.0)
Hemoglobin: 14.4 g/dL (ref 12.0–16.0)
MCH: 31.9 pg (ref 26.0–34.0)
MCHC: 34 g/dL (ref 32.0–36.0)
MCV: 93.6 fL (ref 80.0–100.0)
Platelets: 251 10*3/uL (ref 150–440)
RBC: 4.51 MIL/uL (ref 3.80–5.20)
RDW: 14 % (ref 11.5–14.5)
WBC: 7.9 10*3/uL (ref 3.6–11.0)

## 2016-10-10 LAB — LIPASE, BLOOD: LIPASE: 13 U/L (ref 11–51)

## 2016-10-10 LAB — POCT PREGNANCY, URINE: PREG TEST UR: POSITIVE — AB

## 2016-10-10 LAB — HCG, QUANTITATIVE, PREGNANCY: hCG, Beta Chain, Quant, S: 31188 m[IU]/mL — ABNORMAL HIGH (ref <5)

## 2016-10-10 MED ORDER — CEPHALEXIN 500 MG PO CAPS: 500.0000 mg | ORAL_CAPSULE | Freq: Three times a day (TID) | ORAL | 0 refills | Status: DC

## 2016-10-10 MED ORDER — ACETAMINOPHEN 325 MG PO TABS
650.0000 mg | ORAL_TABLET | Freq: Once | ORAL | Status: AC
  Administered 2016-10-10: 650 mg via ORAL
  Filled 2016-10-10: qty 2

## 2016-10-10 MED ORDER — PHENAZOPYRIDINE HCL 200 MG PO TABS: 200.0000 mg | ORAL_TABLET | Freq: Three times a day (TID) | ORAL | 0 refills | Status: AC | PRN

## 2016-10-10 MED ORDER — METOCLOPRAMIDE HCL 10 MG PO TABS
10.0000 mg | ORAL_TABLET | Freq: Once | ORAL | Status: AC
  Administered 2016-10-10: 10 mg via ORAL
  Filled 2016-10-10: qty 1

## 2016-10-10 NOTE — Discharge Instructions (Signed)
Please seek medical attention for any high fevers, chest pain, shortness of breath, change in behavior, persistent vomiting, bloody stool or any other new or concerning symptoms.  

## 2016-10-10 NOTE — ED Triage Notes (Signed)
Patient presents to ED via POV with multiple complaints including; hand swelling, leg pain and abdominal pain. Patient c/o diarrhea. Denies N/V. Patient states she is about [redacted] weeks pregnant. Patient has received no prenatal care thus far.

## 2016-10-10 NOTE — ED Triage Notes (Addendum)
First Nurse Note:  C/O right hand swelling x 2 days and left side pain x 1 day.  Patient states she is about [redacted] weeks pregnant but has not had any prenatal care. G 6  P5 AAOx3.  Skin warm and dry.  Posture upright and relaxed.  Gait steady.

## 2016-10-10 NOTE — ED Provider Notes (Signed)
[facility] Emergency Department Provider Note   ____________________________________________   I have reviewed the triage vital signs and the nursing notes.   HISTORY  Chief Complaint Abdominal Pain   History limited by: Not Limited   HPI Eileen Tate is a 31 y.o. female who presents to the emergency department today with primary complaint of left flank pain. Patient states that it started last night. It has continued throughout the day. It is somewhat intermittent. She states it feels better when she lies down. She has no some diarrhea starting yesterday. This has been nonbloody. She denies any associated nausea or vomiting. Patient has secondary complaint of some right hand swelling. This was noticed this morning. Patient denies any pain. Denies any known injury. The patient is [redacted] weeks pregnant and denies any vaginal bleeding.   Past Medical History:  Diagnosis Date  . Abscess and cellulitis   . Asthma   . Heart murmur   . Tobacco use disorder   . UTI (lower urinary tract infection)     Patient Active Problem List   Diagnosis Date Noted  . Vaginal delivery 10/10/2015  . PROM (premature rupture of membranes) 10/09/2015  . Indication for care in labor and delivery, antepartum 08/23/2015  . Infections of genitourinary tract antepartum 01/13/2013  . LSIL (low grade squamous intraepithelial lesion) on Pap smear 12/04/2012  . Supervision of normal pregnancy 09/18/2012  . Discharge of breast 07/05/2012    Past Surgical History:  Procedure Laterality Date  . NO PAST SURGERIES      Prior to Admission medications   Medication Sig Start Date End Date Taking? Authorizing Provider  albuterol (PROVENTIL HFA;VENTOLIN HFA) 108 (90 BASE) MCG/ACT inhaler Inhale 2 puffs into the lungs every 6 (six) hours as needed for wheezing or shortness of breath. 07/19/15   Rebecka Apley, MD  levalbuterol [facility] HFA) 45 MCG/ACT inhaler Inhale 1 puff into the  lungs daily as needed. For shortness of breath    Historical Provider, MD  norethindrone-ethinyl estradiol 1/35 (ORTHO-NOVUM 1/35, 28,) tablet Take 1 tablet by mouth daily. 10/24/15   Tresea Mall, CNM  oxyCODONE-acetaminophen (ROXICET) 5-325 MG tablet Take 1 tablet by mouth every 4 (four) hours as needed for moderate pain or severe pain. 10/11/15   Tresea Mall, CNM  Prenatal Vit-Fe Fumarate-FA (PRENATAL MULTIVITAMIN) TABS Take 1 tablet by mouth at bedtime. Reported on 10/09/2015    Historical Provider, MD  silver sulfADIAZINE (SILVADENE) 1 % cream Apply 1 application topically daily. 05/07/16   Tiffany Neva Seat, PA-C  traMADol (ULTRAM) 50 MG tablet Take 1 tablet (50 mg total) by mouth every 6 (six) hours as needed. 06/11/16   Jene Every, MD    Allergies Guaifenesin & derivatives and Ibuprofen  Family History  Problem Relation Age of Onset  . Cancer Maternal Aunt 19    breast  . Cancer Paternal Aunt 59    breast  . Diabetes Paternal Grandmother   . Heart disease Paternal Grandmother     Social History Social History  Substance Use Topics  . Smoking status: Current Every Day Smoker    Packs/day: 0.25    Years: 9.00    Types: Cigarettes  . Smokeless tobacco: Never Used  . Alcohol use No    Review of Systems  Constitutional: Negative for fever. Cardiovascular: Negative for chest pain. Respiratory: Negative for shortness of breath. Gastrointestinal: Positive for left flank pain. Genitourinary: Negative for dysuria. Musculoskeletal: Positive for right hand swelling. Skin: Negative for rash. Neurological: Negative  for headaches, focal weakness or numbness.  10-point ROS otherwise negative.  ____________________________________________   PHYSICAL EXAM:  VITAL SIGNS: ED Triage Vitals  Enc Vitals Group     BP 10/10/16 1623 118/83     Pulse Rate 10/10/16 1623 80     Resp 10/10/16 1623 16     Temp 10/10/16 1623 98.1 F (36.7 C)     Temp Source 10/10/16 1623 Oral      SpO2 10/10/16 1623 100 %     Weight 10/10/16 1623 160 lb (72.6 kg)     Height 10/10/16 1623 5' (1.524 m)     Head Circumference --      Peak Flow --      Pain Score 10/10/16 1634 0    Constitutional: Alert and oriented. Well appearing and in no distress. Eyes: Conjunctivae are normal. Normal extraocular movements. ENT   Head: Normocephalic and atraumatic.   Nose: No congestion/rhinnorhea.   Mouth/Throat: Mucous membranes are moist.   Neck: No stridor. Hematological/Lymphatic/Immunilogical: No cervical lymphadenopathy. Cardiovascular: Normal rate, regular rhythm.  No murmurs, rubs, or gallops.  Respiratory: Normal respiratory effort without tachypnea nor retractions. Breath sounds are clear and equal bilaterally. No wheezes/rales/rhonchi. Gastrointestinal: Soft and minimally tender to palpation in the left side of the abdomen. No rebound. No guarding.  Genitourinary: Deferred Musculoskeletal: Normal range of motion in all extremities. The right hand is not appreciably swollen compared to the left on my exam. No erythema. No tenderness. Full range of motion. Grip strength 5/5.  Neurologic:  Normal speech and language. No gross focal neurologic deficits are appreciated.  Skin:  Skin is warm, dry and intact. No rash noted. Psychiatric: Mood and affect are normal. Speech and behavior are normal. Patient exhibits appropriate insight and judgment.  ____________________________________________    LABS (pertinent positives/negatives)  Labs Reviewed  URINALYSIS, COMPLETE (UACMP) WITH MICROSCOPIC - Abnormal; Notable for the following:       Result Value   Color, Urine YELLOW (*)    APPearance CLOUDY (*)    Leukocytes, UA LARGE (*)    Squamous Epithelial / LPF TOO NUMEROUS TO COUNT (*)    All other components within normal limits  COMPREHENSIVE METABOLIC PANEL - Abnormal; Notable for the following:    Sodium 133 (*)    All other components within normal limits  POCT  PREGNANCY, URINE - Abnormal; Notable for the following:    Preg Test, Ur POSITIVE (*)    All other components within normal limits  LIPASE, BLOOD  CBC  HCG, QUANTITATIVE, PREGNANCY  POC URINE PREG, ED     ____________________________________________   EKG  None  ____________________________________________    RADIOLOGY  None  ____________________________________________   PROCEDURES  Procedures  ____________________________________________   INITIAL IMPRESSION / ASSESSMENT AND PLAN / ED COURSE  Pertinent labs & imaging results that were available during my care of the patient were reviewed by me and considered in my medical decision making (see chart for details).  Patient presented to the emergency department today because of concerns for some left flank pain as well as right hand swelling. Exam no appreciable swelling of right hand, no erythema. No tenderness. This point unclear etiology. In terms of the left flank pain. Did show red blood cells. Given some concern for kidney stones US was ordered however patient stated she had to leave to get dinner ready prior to starting the US. Will plan on treating for possible infection. Discussed return precautions.  ____________________________________________   FINAL CLINICAL IMPRESSION(S) /  ED DIAGNOSES  Final diagnoses:  Flank pain     Note: This dictation was prepared with Dragon dictation. Any transcriptional errors that result from this process are unintentional     Phineas Semen, MD 10/10/16 1909

## 2017-04-24 ENCOUNTER — Other Ambulatory Visit: Payer: Self-pay | Admitting: Family Medicine

## 2017-04-24 DIAGNOSIS — O0943 Supervision of pregnancy with grand multiparity, third trimester: Secondary | ICD-10-CM

## 2017-04-25 LAB — OB RESULTS CONSOLE GBS: GBS: NEGATIVE

## 2017-04-25 LAB — OB RESULTS CONSOLE VARICELLA ZOSTER ANTIBODY, IGG: VARICELLA IGG: IMMUNE

## 2017-04-25 LAB — OB RESULTS CONSOLE HEPATITIS B SURFACE ANTIGEN: Hepatitis B Surface Ag: NEGATIVE

## 2017-04-25 LAB — OB RESULTS CONSOLE GC/CHLAMYDIA
CHLAMYDIA, DNA PROBE: NEGATIVE
GC PROBE AMP, GENITAL: NEGATIVE

## 2017-04-25 LAB — OB RESULTS CONSOLE RPR: RPR: NONREACTIVE

## 2017-04-25 LAB — OB RESULTS CONSOLE RUBELLA ANTIBODY, IGM: Rubella: IMMUNE

## 2017-04-25 LAB — OB RESULTS CONSOLE HIV ANTIBODY (ROUTINE TESTING): HIV: NONREACTIVE

## 2017-04-27 ENCOUNTER — Inpatient Hospital Stay
Admission: EM | Admit: 2017-04-27 | Discharge: 2017-04-29 | DRG: 775 | Disposition: A | Discharge status: Home / Self Care | Payer: Medicaid Other | Attending: Obstetrics and Gynecology | Admitting: Obstetrics and Gynecology

## 2017-04-27 ENCOUNTER — Inpatient Hospital Stay: Payer: Medicaid Other

## 2017-04-27 DIAGNOSIS — O9952 Diseases of the respiratory system complicating childbirth: Secondary | ICD-10-CM | POA: Diagnosis present

## 2017-04-27 DIAGNOSIS — Z3A38 38 weeks gestation of pregnancy: Secondary | ICD-10-CM

## 2017-04-27 DIAGNOSIS — J45909 Unspecified asthma, uncomplicated: Secondary | ICD-10-CM | POA: Diagnosis present

## 2017-04-27 DIAGNOSIS — F17210 Nicotine dependence, cigarettes, uncomplicated: Secondary | ICD-10-CM | POA: Diagnosis present

## 2017-04-27 DIAGNOSIS — O0933 Supervision of pregnancy with insufficient antenatal care, third trimester: Secondary | ICD-10-CM

## 2017-04-27 DIAGNOSIS — O4292 Full-term premature rupture of membranes, unspecified as to length of time between rupture and onset of labor: Principal | ICD-10-CM | POA: Diagnosis present

## 2017-04-27 DIAGNOSIS — O4290 Premature rupture of membranes, unspecified as to length of time between rupture and onset of labor, unspecified weeks of gestation: Secondary | ICD-10-CM | POA: Diagnosis present

## 2017-04-27 DIAGNOSIS — O99334 Smoking (tobacco) complicating childbirth: Secondary | ICD-10-CM | POA: Diagnosis present

## 2017-04-27 DIAGNOSIS — O26893 Other specified pregnancy related conditions, third trimester: Secondary | ICD-10-CM | POA: Diagnosis present

## 2017-04-27 DIAGNOSIS — Z6791 Unspecified blood type, Rh negative: Secondary | ICD-10-CM

## 2017-04-27 LAB — URINE DRUG SCREEN, QUALITATIVE (ARMC ONLY)
AMPHETAMINES, UR SCREEN: NOT DETECTED
BARBITURATES, UR SCREEN: NOT DETECTED
BENZODIAZEPINE, UR SCRN: NOT DETECTED
CANNABINOID 50 NG, UR ~~LOC~~: NOT DETECTED
COCAINE METABOLITE, UR ~~LOC~~: NOT DETECTED
MDMA (ECSTASY) UR SCREEN: NOT DETECTED
METHADONE SCREEN, URINE: NOT DETECTED
Opiate, Ur Screen: NOT DETECTED
Phencyclidine (PCP) Ur S: NOT DETECTED
TRICYCLIC, UR SCREEN: NOT DETECTED

## 2017-04-27 LAB — CBC
HEMATOCRIT: 31 % — AB (ref 35.0–47.0)
HEMOGLOBIN: 10.6 g/dL — AB (ref 12.0–16.0)
MCH: 31.5 pg (ref 26.0–34.0)
MCHC: 34.2 g/dL (ref 32.0–36.0)
MCV: 92.1 fL (ref 80.0–100.0)
Platelets: 215 10*3/uL (ref 150–440)
RBC: 3.36 MIL/uL — AB (ref 3.80–5.20)
RDW: 14.2 % (ref 11.5–14.5)
WBC: 11.6 10*3/uL — ABNORMAL HIGH (ref 3.6–11.0)

## 2017-04-27 MED ORDER — OXYTOCIN BOLUS FROM INFUSION: 500.0000 mL | Freq: Once | INTRAVENOUS | Status: DC

## 2017-04-27 MED ORDER — LACTATED RINGERS IV SOLN
INTRAVENOUS | Status: DC
  Administered 2017-04-27 – 2017-04-28 (×2): via INTRAVENOUS

## 2017-04-27 MED ORDER — OXYTOCIN 40 UNITS IN LACTATED RINGERS INFUSION - SIMPLE MED
1.0000 m[IU]/min | INTRAVENOUS | Status: DC
  Administered 2017-04-27: 1 m[IU]/min via INTRAVENOUS
  Filled 2017-04-27: qty 1000

## 2017-04-27 MED ORDER — ACETAMINOPHEN 325 MG PO TABS
650.0000 mg | ORAL_TABLET | ORAL | Status: DC | PRN
  Filled 2017-04-27 (×2): qty 2

## 2017-04-27 MED ORDER — MISOPROSTOL 200 MCG PO TABS
ORAL_TABLET | ORAL | Status: AC
  Filled 2017-04-27: qty 4

## 2017-04-27 MED ORDER — SOD CITRATE-CITRIC ACID 500-334 MG/5ML PO SOLN: 30.0000 mL | ORAL | Status: DC | PRN

## 2017-04-27 MED ORDER — AMMONIA AROMATIC IN INHA
RESPIRATORY_TRACT | Status: AC
  Filled 2017-04-27: qty 10

## 2017-04-27 MED ORDER — TERBUTALINE SULFATE 1 MG/ML IJ SOLN: 0.2500 mg | Freq: Once | INTRAMUSCULAR | Status: DC | PRN

## 2017-04-27 MED ORDER — OXYTOCIN 40 UNITS IN LACTATED RINGERS INFUSION - SIMPLE MED
2.5000 [IU]/h | INTRAVENOUS | Status: DC
  Filled 2017-04-27: qty 1000

## 2017-04-27 MED ORDER — LIDOCAINE HCL (PF) 1 % IJ SOLN
30.0000 mL | INTRAMUSCULAR | Status: AC | PRN
  Administered 2017-04-28: 1.2 mL via SUBCUTANEOUS

## 2017-04-27 MED ORDER — LIDOCAINE HCL (PF) 1 % IJ SOLN
INTRAMUSCULAR | Status: AC
  Filled 2017-04-27: qty 30

## 2017-04-27 MED ORDER — OXYTOCIN 10 UNIT/ML IJ SOLN
INTRAMUSCULAR | Status: AC
  Administered 2017-04-28: 10 [IU] via INTRAMUSCULAR
  Filled 2017-04-27: qty 2

## 2017-04-27 MED ORDER — BUTORPHANOL TARTRATE 1 MG/ML IJ SOLN: 1.0000 mg | INTRAMUSCULAR | Status: DC | PRN

## 2017-04-27 MED ORDER — ONDANSETRON HCL 4 MG/2ML IJ SOLN: 4.0000 mg | Freq: Four times a day (QID) | INTRAMUSCULAR | Status: DC | PRN

## 2017-04-27 MED ORDER — SODIUM CHLORIDE 0.9 % IV SOLN: 2.0000 g | Freq: Once | INTRAVENOUS | Status: DC

## 2017-04-27 MED ORDER — MISOPROSTOL 25 MCG QUARTER TABLET
25.0000 ug | ORAL_TABLET | ORAL | Status: DC | PRN
  Administered 2017-04-27: 25 ug via VAGINAL
  Filled 2017-04-27: qty 1

## 2017-04-27 MED ORDER — LACTATED RINGERS IV SOLN
500.0000 mL | INTRAVENOUS | Status: DC | PRN
  Administered 2017-04-27: 500 mL via INTRAVENOUS

## 2017-04-27 NOTE — Progress Notes (Signed)
Patient possibly ruptured, orders from Dr Dalbert Garnet to have patient walk with pad on. Patient asked to walk down to speak to her husband who is in the car with her 3 younger children, I walked with her to entrance then patient refused to let mw walk with her further, states that she will be right back after she speaks to her husband about who can watch her children. I asked again to walk with her and again she refused. Alerted security at third floor entrance.

## 2017-04-27 NOTE — OB Triage Note (Signed)
Patient here from [employer]. Just entered parental care 2 days ago. Patient due date calculated from LMP, she has not had any ultrasounds. She is complaining  today lower abdominal pressure, and having to pee every 10 to 15 minutes.

## 2017-04-27 NOTE — OB Triage Provider Note (Signed)
TRIAGE VISIT with NST   Eileen Tate is a 31 y.o. [id number]. She is at [redacted]w[redacted]d gestation by LMP, presenting with vaginal pressure. No prenatal care until last week. Labs normal.  Indication: Contractions  S: Resting comfortably. no CTX, no VB. Active fetal movement. Concerned about pressure. No dysuria. O:  LMP 08/22/2016 (Approximate)  No results found for this or any previous visit (from the past 48 hour(s)).   Gen: NAD, AAOx3      Abd: FNTTP      Ext: Non-tender, Nonedmeatous    NST/FHT: 125, mod var, +accels, no decels TOCO: quiet AVW:UJWJXBJY: 2 Effacement (%): 50 Station: Ballotable Exam by:: Eileen Tate   NST: Reactive. See FHT above for particulars.  A/P:  31 y.o. [id number] [redacted]w[redacted]d with pelvic pressure, no active labor.   Labor: not present.   BSUS shows vtx, no placenta previa, transverse positioning.   Fetal Wellbeing: NST reactive Reassuring Cat 1 tracing.  Anatomy ultrasound ordered today

## 2017-04-27 NOTE — H&P (Signed)
OB ADMISSION/ HISTORY & PHYSICAL:  Admission Date: 04/27/2017  1:35 PM  Admit Diagnosis: PROM  Eileen Tate is a 31 y.o. female presenting for PROM and pelvic pressure.  Prenatal History: [id number]  EDC : 05/08/2017, Date entered prior to episode creation  [facility] at [facility] Primary Ob Provider: [facility] Prenatal course complicated by  - 1 visit with [facility] 2 days prior to admission, and another today - Short interpregnancy interval - No screening or prenatal care until this week  Prenatal Labs: ABO, Rh:  a Negative Antibody:   - received Rhogam earlier this week Rubella: Immune (09/26 0000)  RPR: Nonreactive (09/26 0000)  HBsAg: Negative (09/26 0000)  HIV: Non-reactive (09/26 0000)  GTT: none GBS: Negative (09/26 0000)   Medical / Surgical History :  Past medical history:  Past Medical History:  Diagnosis Date  . Abscess and cellulitis   . Asthma   . Heart murmur   . Tobacco use disorder   . UTI (lower urinary tract infection)      Past surgical history:  Past Surgical History:  Procedure Laterality Date  . NO PAST SURGERIES      Family History:  Family History  Problem Relation Age of Onset  . Cancer Maternal Aunt 19       breast  . Cancer Paternal Aunt 11       breast  . Diabetes Paternal Grandmother   . Heart disease Paternal Grandmother      Social History:  reports that she has been smoking Cigarettes.  She has a 2.25 pack-year smoking history. She has never used smokeless tobacco. She reports that she does not drink alcohol or use drugs.   Allergies: Guaifenesin & derivatives and Ibuprofen    Current Medications at time of admission:  Prior to Admission medications   Medication Sig Start Date End Date Taking? Authorizing Provider  albuterol (PROVENTIL HFA;VENTOLIN HFA) 108 (90 BASE) MCG/ACT inhaler Inhale 2 puffs into the lungs every 6 (six) hours as needed for wheezing or shortness of breath. Patient not taking: Reported on 04/27/2017 07/19/15    Rebecka Apley, MD  cephALEXin (KEFLEX) 500 MG capsule Take 1 capsule (500 mg total) by mouth 3 (three) times daily. Patient not taking: Reported on 04/27/2017 10/10/16   Phineas Semen, MD  levalbuterol [facility] HFA) 45 MCG/ACT inhaler Inhale 1 puff into the lungs daily as needed. For shortness of breath    [provider]  norethindrone-ethinyl estradiol 1/35 (ORTHO-NOVUM 1/35, 28,) tablet Take 1 tablet by mouth daily. Patient not taking: Reported on 04/27/2017 10/24/15   Tresea Mall, CNM  oxyCODONE-acetaminophen (ROXICET) 5-325 MG tablet Take 1 tablet by mouth every 4 (four) hours as needed for moderate pain or severe pain. Patient not taking: Reported on 04/27/2017 10/11/15   Tresea Mall, CNM  phenazopyridine (PYRIDIUM) 200 MG tablet Take 1 tablet (200 mg total) by mouth 3 (three) times daily as needed for pain. Patient not taking: Reported on 04/27/2017 10/10/16 10/10/17  Phineas Semen, MD  Prenatal Vit-Fe Fumarate-FA (PRENATAL MULTIVITAMIN) TABS Take 1 tablet by mouth at bedtime. Reported on 10/09/2015    [provider]  silver sulfADIAZINE (SILVADENE) 1 % cream Apply 1 application topically daily. Patient not taking: Reported on 04/27/2017 05/07/16   Marlon Pel, PA-C  traMADol (ULTRAM) 50 MG tablet Take 1 tablet (50 mg total) by mouth every 6 (six) hours as needed. Patient not taking: Reported on 04/27/2017 06/11/16   Jene Every, MD     Review of  Systems: Active FM  Physical Exam:  VS: Blood pressure 114/67, pulse 72, temperature 99.1 F (37.3 C), temperature source Oral, resp. rate 20, height 5' (1.524 m), weight 76.2 kg (168 lb), last menstrual period 08/22/2016, not currently breastfeeding.  General: alert and oriented, appears NAD Heart: RRR Lungs: Clear lung fields Reflexes: 3+upper extremities  Abdomen: Gravid, soft and non-tender, non-distended / uterus: non tender Extremities: no edema  Genitalia / VE: Dilation: 2 Effacement (%):  50 Station: Ballotable Exam by:: Annamary Buschman  FHR: baseline rate 135 / variability mod / accelerations + / no decelerations TOCO: occasional  Last [place] Today: 34wks, normal anatomy, normal fluid  Assessment: 38+[redacted] weeks gestation by LMP, but by ultrasound today they measured her at 34wks by growth. Her fundus is 39cm 1 stage of labor FHR category 1   Plan:  Admit for active labor Labs pending Epidural when desired Continuous fetal monitoring  1. Fetal Well being  - Fetal Tracing: Cat I - Ultrasound:  reviewed, as above - Group B Streptococcus: negative - Presentation: vtx confirmed by bedside ultrasound   2. Routine OB: - Prenatal labs reviewed, as above - Rh neg - received Rhogam this week  3. Induction of Labor:  -  Contractions external toco in place -  Pelvis proven to 7# -  Plan for induction with cytotec  4. Post Partum Planning: - Infant feeding: bottle - Contraception: desires BTL

## 2017-04-28 ENCOUNTER — Inpatient Hospital Stay: Payer: Medicaid Other | Admitting: Anesthesiology

## 2017-04-28 MED ORDER — SODIUM CHLORIDE 0.9 % IV SOLN: 250.0000 mL | INTRAVENOUS | Status: DC | PRN

## 2017-04-28 MED ORDER — FENTANYL 2.5 MCG/ML W/ROPIVACAINE 0.15% IN NS 100 ML EPIDURAL (ARMC)
EPIDURAL | Status: AC
  Filled 2017-04-28: qty 100

## 2017-04-28 MED ORDER — DIPHENHYDRAMINE HCL 50 MG/ML IJ SOLN: 12.5000 mg | INTRAMUSCULAR | Status: DC | PRN

## 2017-04-28 MED ORDER — ONDANSETRON HCL 4 MG/2ML IJ SOLN: 4.0000 mg | INTRAMUSCULAR | Status: DC | PRN

## 2017-04-28 MED ORDER — TETANUS-DIPHTH-ACELL PERTUSSIS 5-2.5-18.5 LF-MCG/0.5 IM SUSP: 0.5000 mL | Freq: Once | INTRAMUSCULAR | Status: DC

## 2017-04-28 MED ORDER — SIMETHICONE 80 MG PO CHEW: 80.0000 mg | CHEWABLE_TABLET | ORAL | Status: DC | PRN

## 2017-04-28 MED ORDER — FENTANYL 2.5 MCG/ML W/ROPIVACAINE 0.15% IN NS 100 ML EPIDURAL (ARMC)
EPIDURAL | Status: DC | PRN
  Administered 2017-04-28: 12 mL/h via EPIDURAL

## 2017-04-28 MED ORDER — ACETAMINOPHEN 325 MG PO TABS
650.0000 mg | ORAL_TABLET | ORAL | Status: DC | PRN
  Administered 2017-04-28 – 2017-04-29 (×4): 650 mg via ORAL
  Filled 2017-04-28 (×2): qty 2

## 2017-04-28 MED ORDER — DIBUCAINE 1 % RE OINT: 1.0000 "application " | TOPICAL_OINTMENT | RECTAL | Status: DC | PRN

## 2017-04-28 MED ORDER — OXYTOCIN 10 UNIT/ML IJ SOLN
10.0000 [IU] | Freq: Once | INTRAMUSCULAR | Status: AC
  Administered 2017-04-28: 10 [IU] via INTRAMUSCULAR
  Filled 2017-04-28 (×2): qty 1

## 2017-04-28 MED ORDER — COCONUT OIL OIL: 1.0000 "application " | TOPICAL_OIL | Status: DC | PRN

## 2017-04-28 MED ORDER — FENTANYL 2.5 MCG/ML W/ROPIVACAINE 0.15% IN NS 100 ML EPIDURAL (ARMC): 12.0000 mL/h | EPIDURAL | Status: DC

## 2017-04-28 MED ORDER — SODIUM CHLORIDE 0.9% FLUSH: 3.0000 mL | INTRAVENOUS | Status: DC | PRN

## 2017-04-28 MED ORDER — OXYCODONE HCL 5 MG PO TABS
5.0000 mg | ORAL_TABLET | ORAL | Status: DC | PRN
  Administered 2017-04-28 – 2017-04-29 (×3): 5 mg via ORAL
  Filled 2017-04-28 (×3): qty 1

## 2017-04-28 MED ORDER — ZOLPIDEM TARTRATE 5 MG PO TABS: 5.0000 mg | ORAL_TABLET | Freq: Every evening | ORAL | Status: DC | PRN

## 2017-04-28 MED ORDER — SENNOSIDES-DOCUSATE SODIUM 8.6-50 MG PO TABS
2.0000 | ORAL_TABLET | ORAL | Status: DC
  Administered 2017-04-28: 2 via ORAL
  Filled 2017-04-28: qty 2

## 2017-04-28 MED ORDER — BENZOCAINE-MENTHOL 20-0.5 % EX AERO: 1.0000 "application " | INHALATION_SPRAY | CUTANEOUS | Status: DC | PRN

## 2017-04-28 MED ORDER — LACTATED RINGERS IV SOLN: 500.0000 mL | Freq: Once | INTRAVENOUS | Status: DC

## 2017-04-28 MED ORDER — LIDOCAINE-EPINEPHRINE (PF) 1.5 %-1:200000 IJ SOLN
INTRAMUSCULAR | Status: DC | PRN
  Administered 2017-04-28: 3 mL via EPIDURAL

## 2017-04-28 MED ORDER — BISACODYL 10 MG RE SUPP: 10.0000 mg | Freq: Every day | RECTAL | Status: DC | PRN

## 2017-04-28 MED ORDER — WITCH HAZEL-GLYCERIN EX PADS: 1.0000 "application " | MEDICATED_PAD | CUTANEOUS | Status: DC | PRN

## 2017-04-28 MED ORDER — EPHEDRINE 5 MG/ML INJ
10.0000 mg | INTRAVENOUS | Status: DC | PRN
  Filled 2017-04-28: qty 2

## 2017-04-28 MED ORDER — MEASLES, MUMPS & RUBELLA VAC ~~LOC~~ INJ
0.5000 mL | INJECTION | Freq: Once | SUBCUTANEOUS | Status: DC
  Filled 2017-04-28: qty 0.5

## 2017-04-28 MED ORDER — ONDANSETRON HCL 4 MG PO TABS: 4.0000 mg | ORAL_TABLET | ORAL | Status: DC | PRN

## 2017-04-28 MED ORDER — PRENATAL MULTIVITAMIN CH
1.0000 | ORAL_TABLET | Freq: Every day | ORAL | Status: DC
  Administered 2017-04-28 – 2017-04-29 (×2): 1 via ORAL
  Filled 2017-04-28 (×2): qty 1

## 2017-04-28 MED ORDER — PHENYLEPHRINE 40 MCG/ML (10ML) SYRINGE FOR IV PUSH (FOR BLOOD PRESSURE SUPPORT)
80.0000 ug | PREFILLED_SYRINGE | INTRAVENOUS | Status: DC | PRN
  Filled 2017-04-28: qty 5

## 2017-04-28 MED ORDER — FLEET ENEMA 7-19 GM/118ML RE ENEM: 1.0000 | ENEMA | Freq: Every day | RECTAL | Status: DC | PRN

## 2017-04-28 MED ORDER — SODIUM CHLORIDE 0.9% FLUSH: 3.0000 mL | Freq: Two times a day (BID) | INTRAVENOUS | Status: DC

## 2017-04-28 MED ORDER — DIPHENHYDRAMINE HCL 25 MG PO CAPS: 25.0000 mg | ORAL_CAPSULE | Freq: Four times a day (QID) | ORAL | Status: DC | PRN

## 2017-04-28 NOTE — Clinical Social Work Note (Addendum)
CSW received consult for "other" with no explanation. CSW will contact the RN for the patient once she has finished her procedure to gain more information.  CSW has received additional information from the attending RN. The CSW will visit the patient when able to assess for CPS call for possible home safety needs and/or neglect of other children in the home.  Argentina Ponder, MSW, Theresia Majors [phone]

## 2017-04-28 NOTE — Anesthesia Procedure Notes (Signed)
Epidural Patient location during procedure: OB Start time: 04/28/2017 1:19 AM End time: 04/28/2017 1:42 AM  Staffing Performed: anesthesiologist   Preanesthetic Checklist Completed: patient identified, site marked, surgical consent, pre-op evaluation, timeout performed, IV checked, risks and benefits discussed and monitors and equipment checked  Epidural Patient position: sitting Prep: Betadine Patient monitoring: heart rate, continuous pulse ox and blood pressure Approach: midline Location: L4-L5 Injection technique: LOR saline  Needle:  Needle type: Tuohy  Needle gauge: 17 G Needle length: 9 cm and 9 Needle insertion depth: 9 cm Catheter type: closed end flexible Catheter size: 20 Guage Catheter at skin depth: 13 cm Test dose: negative and 1.5% lidocaine with Epi 1:200 K  Assessment Events: blood not aspirated, injection not painful, no injection resistance, negative IV test and no paresthesia  Additional Notes   Patient tolerated the insertion well without complications.Reason for block:procedure for pain

## 2017-04-28 NOTE — Anesthesia Preprocedure Evaluation (Signed)
Anesthesia Evaluation  Patient identified by MRN, date of birth, ID band Patient awake    Reviewed: Allergy & Precautions, NPO status , Patient's Chart, lab work & pertinent test results  History of Anesthesia Complications Negative for: history of anesthetic complications  Airway Mallampati: III       Dental   Pulmonary asthma , Current Smoker,           Cardiovascular (-) hypertension(-) Past MI and (-) CHF (-) dysrhythmias (-) Valvular Problems/Murmurs     Neuro/Psych neg Seizures    GI/Hepatic Neg liver ROS, GERD  ,  Endo/Other  neg diabetes  Renal/GU negative Renal ROS     Musculoskeletal   Abdominal   Peds  Hematology   Anesthesia Other Findings   Reproductive/Obstetrics (+) Pregnancy                             Anesthesia Physical Anesthesia Plan  ASA: II  Anesthesia Plan: Epidural   Post-op Pain Management:    Induction:   PONV Risk Score and Plan:   Airway Management Planned:   Additional Equipment:   Intra-op Plan:   Post-operative Plan:   Informed Consent: I have reviewed the patients History and Physical, chart, labs and discussed the procedure including the risks, benefits and alternatives for the proposed anesthesia with the patient or authorized representative who has indicated his/her understanding and acceptance.     Plan Discussed with:   Anesthesia Plan Comments:         Anesthesia Quick Evaluation
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
## 2017-04-29 LAB — CBC
HCT: 33.2 % — ABNORMAL LOW (ref 35.0–47.0)
Hemoglobin: 11.2 g/dL — ABNORMAL LOW (ref 12.0–16.0)
MCH: 31.4 pg (ref 26.0–34.0)
MCHC: 33.7 g/dL (ref 32.0–36.0)
MCV: 93.1 fL (ref 80.0–100.0)
PLATELETS: 200 10*3/uL (ref 150–440)
RBC: 3.57 MIL/uL — AB (ref 3.80–5.20)
RDW: 14.2 % (ref 11.5–14.5)
WBC: 11.6 10*3/uL — ABNORMAL HIGH (ref 3.6–11.0)

## 2017-04-29 LAB — RPR: RPR Ser Ql: NONREACTIVE

## 2017-04-29 MED ORDER — ACETAMINOPHEN 325 MG PO TABS: 650.0000 mg | ORAL_TABLET | ORAL | 1 refills | Status: DC | PRN

## 2017-04-29 NOTE — Anesthesia Postprocedure Evaluation (Signed)
Anesthesia Post Note  Patient: Eileen Tate  Procedure(s) Performed: * No procedures listed *  Patient location during evaluation: Mother Baby Anesthesia Type: Epidural Level of consciousness: awake and alert Pain management: pain level controlled Vital Signs Assessment: post-procedure vital signs reviewed and stable Respiratory status: spontaneous breathing, nonlabored ventilation and respiratory function stable Cardiovascular status: stable Postop Assessment: no headache and no backache (able to ambulate) Anesthetic complications: no     Last Vitals:  Vitals:   04/29/17 0325 04/29/17 0713  BP: (!) 99/55 108/77  Pulse:  61  Resp: 20 18  Temp: 36.7 C 36.4 C  SpO2: 100% 100%    Last Pain:  Vitals:   04/29/17 0815  TempSrc:   PainSc: 0-No pain                 [name]

## 2017-04-29 NOTE — Discharge Summary (Signed)
Obstetrical Discharge Summary  Patient Name: Eileen Tate DOB: 1985/10/28 MRN: [id number]  Date of Admission: 04/27/2017 Date of Delivery: 04/28/17 Delivered by: Christeen Douglas Date of Discharge: 04/29/2017  Primary OB: none WUJ:[id number] last menstrual period was 08/22/2016 (approximate). EDC Estimated Date of Delivery: 05/08/17 Gestational Age at Delivery: [redacted]w[redacted]d   Antepartum complications: no prenatal care, close interval pregnancy, poor social situation Admitting Diagnosis: PROM, IOL Secondary Diagnosis: Patient Active Problem List   Diagnosis Date Noted  . Premature rupture of membranes 04/27/2017  . Vaginal delivery 10/10/2015  . PROM (premature rupture of membranes) 10/09/2015  . Indication for care in labor and delivery, antepartum 08/23/2015  . Infections of genitourinary tract antepartum 01/13/2013  . LSIL (low grade squamous intraepithelial lesion) on Pap smear 12/04/2012  . Supervision of normal pregnancy 09/18/2012  . Discharge of breast 07/05/2012    Augmentation: cytotec, pitocin Complications: None Intrapartum complications/course:  admitted with PROM and inducted with cytotec and pitocin. GBS neg. 1 prenatal visit prior to today. She progressed to fully dilated with an epidural, pushed once for head and a tight nuchal cord was clamped and cut on the perineum. The rest of the body delivered easy. No tears. Placenta spontaneously delivered, fundus firm. Baby placed on maternal abdomen. Bleeding minimal.  Date of Delivery: 04/28/17 Delivered By: Christeen Douglas Delivery Type: spontaneous vaginal delivery Anesthesia: epidural Placenta: sponatneous Laceration: none Episiotomy: none Newborn Data: Live born female  Birth Weight: 5 lb 0.8 oz (2290 g) APGAR: 8, 9  Postpartum Procedures: none  Post partum course:  Patient had an uncomplicated postpartum course.  By time of discharge on PPD#1, her pain was controlled on oral pain medications; she had  appropriate lochia and was ambulating, voiding without difficulty and tolerating regular diet.  She was evaluated by Social Work for resources needed. She was deemed stable for discharge to home.     Discharge Physical Exam:  BP 108/77 (BP Location: Right Arm)   Pulse 61   Temp 98.4 F (36.9 C) (Oral)   Resp 18   Ht 5' (1.524 m)   Wt 76.2 kg (168 lb)   LMP 08/22/2016 (Approximate)   SpO2 100%   Breastfeeding? Unknown   BMI 32.81 kg/m   General: NAD CV: RRR Pulm: CTABL, nl effort ABD: s/nd/nt, fundus firm and below the umbilicus Lochia: moderate DVT Evaluation: LE non-ttp, no evidence of DVT on exam.  Hemoglobin  Date Value Ref Range Status  04/29/2017 11.2 (L) 12.0 - 16.0 g/dL Final   HGB  Date Value Ref Range Status  11/16/2013 13.3 12.0 - 16.0 g/dL Final   HCT  Date Value Ref Range Status  04/29/2017 33.2 (L) 35.0 - 47.0 % Final  11/16/2013 40.5 35.0 - 47.0 % Final     Disposition: stable, discharge to home. Baby Feeding: formula Baby Disposition: home with mom  Rh Immune globulin given: prior to delivery Rubella vaccine given: n/a Tdap vaccine given in AP or PP setting: AP Flu vaccine given in AP or PP setting: AP  Contraception: interval BTL, papers signed today  Prenatal Labs:  ABO, Rh:  a Negative Antibody:   - received Rhogam earlier this week Rubella: Immune (09/26 0000)  RPR: Nonreactive (09/26 0000)  HBsAg: Negative (09/26 0000)  HIV: Non-reactive (09/26 0000)  GTT: none GBS: Negative (09/26 0000)     Plan:  Eileen Tate was discharged to home in good condition. Follow-up appointment with Dr. Dalbert Garnet or Dr.Dorlis Judice in 3 weeks for tubal scheduling  Discharge Medications:  Allergies as of 04/29/2017      Reactions   Guaifenesin & Derivatives Swelling   Ibuprofen Other (See Comments)   Causes blisters      Medication List    STOP taking these medications   oxyCODONE-acetaminophen 5-325 MG tablet Commonly known as:  ROXICET      TAKE these medications   acetaminophen 325 MG tablet Commonly known as:  TYLENOL Take 2 tablets (650 mg total) by mouth every 4 (four) hours as needed (for pain scale < 4).   albuterol 108 (90 Base) MCG/ACT inhaler Commonly known as:  PROVENTIL HFA;VENTOLIN HFA Inhale 2 puffs into the lungs every 6 (six) hours as needed for wheezing or shortness of breath.   cephALEXin 500 MG capsule Commonly known as:  KEFLEX Take 1 capsule (500 mg total) by mouth 3 (three) times daily.   levalbuterol 45 MCG/ACT inhaler Commonly known as:  XOPENEX HFA Inhale 1 puff into the lungs daily as needed. For shortness of breath   norethindrone-ethinyl estradiol 1/35 tablet Commonly known as:  ORTHO-NOVUM 1/35 (28) Take 1 tablet by mouth daily.   phenazopyridine 200 MG tablet Commonly known as:  PYRIDIUM Take 1 tablet (200 mg total) by mouth 3 (three) times daily as needed for pain.   prenatal multivitamin Tabs tablet Take 1 tablet by mouth at bedtime. Reported on 10/09/2015   silver sulfADIAZINE 1 % cream Commonly known as:  SILVADENE Apply 1 application topically daily.   traMADol 50 MG tablet Commonly known as:  ULTRAM Take 1 tablet (50 mg total) by mouth every 6 (six) hours as needed.            Discharge Care Instructions        Start     Ordered   04/29/17 0000  acetaminophen (TYLENOL) 325 MG tablet  Every 4 hours PRN     04/29/17 1658   04/29/17 0000  Call MD for:  temperature >100.4    Comments:  Call MD for temperature >101.0   04/29/17 1658   04/29/17 0000  Call MD for:  persistant nausea and vomiting     04/29/17 1658   04/29/17 0000  Call MD for:  severe uncontrolled pain     04/29/17 1658   04/29/17 0000  Call MD for:  redness, tenderness, or signs of infection (pain, swelling, redness, odor or green/yellow discharge around incision site)     04/29/17 1658   04/29/17 0000  Call MD for:  difficulty breathing, headache or visual disturbances     04/29/17 1658   04/29/17  0000  Call MD for:  persistant dizziness or light-headedness     04/29/17 1659   04/29/17 0000  Activity as tolerated     04/29/17 1659   04/29/17 0000  Sexual acrtivity    Comments:  Please refrain from intercourse until evaluated by your doctor/midwife at your 6 week visit.   04/29/17 1659   04/29/17 0000  Diet general     04/29/17 1659   04/27/17 0000  OB RESULT CONSOLE Group B Strep    Comments:  This external order was created through the Results Console.   04/27/17 1720   04/27/17 0000  OB RESULTS CONSOLE GC/Chlamydia    Comments:  This external order was created through the Results Console.   04/27/17 1720   04/27/17 0000  OB RESULTS CONSOLE RPR    Comments:  This external order was created through the Results Console.    04/27/17 1720   04/27/17  0000  OB RESULTS CONSOLE HIV antibody    Comments:  This external order was created through the Results Console.    04/27/17 1720   04/27/17 0000  OB RESULTS CONSOLE Rubella Antibody    Comments:  This external order was created through the Results Console.    04/27/17 1720   04/27/17 0000  OB RESULTS CONSOLE Varicella zoster antibody, IgG    Comments:  This external order was created through the Results Console.    04/27/17 1720   04/27/17 0000  OB RESULTS CONSOLE Hepatitis B surface antigen    Comments:  This external order was created through the Results Console.    04/27/17 1720      Follow-up Information    Christeen Douglas, MD Follow up in 3 week(s).   Specialty:  Obstetrics and Gynecology Why:  to initiate postpartum tubal ligation papers Contact information: [address] [place] [address] [phone]           Signed: ----- Ranae Plumber, MD Attending Obstetrician and Gynecologist [facility], Department of OB/GYN [facility]

## 2017-04-29 NOTE — Clinical Social Work Note (Signed)
CSW received multiple calls from multiple medical team members about this patient. CSW is receiving conflicting information from the MD vs the RN. The CSW has no preconceived idea for this patient as the patient has not been evaluated by this CSW. According the the [employer] Code of Ethics highlighting the value of dignity for all, at no time has the CSW inferred in writing or verbally that any CPS involvement would be instigated without first evaluating a patient. To do so would compromise the integrity of the field and the dignity of the patients.  The CSW has revisited this situation with the current attending RN. The CSW has provided a list of current resources in the community that are available to this family. The CSW is not able to provide a donated car seat of a size appropriate to the child as none are available. The CSW has advanced this situation to leadership in the department.  Argentina Ponder, MSW, Theresia Majors [phone]

## 2017-04-29 NOTE — Progress Notes (Signed)
Discharge instructions reviewed with patient.  All questions answered.  Waiting on ride.

## 2017-04-29 NOTE — Discharge Instructions (Signed)
Discharge instructions:  ° °Call office if you have any of the following: headache, visual changes, fever >101.0 F, chills, breast concerns, excessive vaginal bleeding, incision drainage or problems, leg pain or redness, depression or any other concerns.  ° °Activity: Do not lift > 10 lbs for 6 weeks.  °No intercourse or tampons for 6 weeks.  °No driving for 1-2 weeks.  ° °Call your doctor for increased pain or vaginal bleeding, temperature above 101.0, depression, or concerns.  No strenuous activity or heavy lifting for 6 weeks.  No intercourse, tampons, douching, or enemas for 6 weeks.  No tub baths-showers only.  No driving for 2 weeks or while taking pain medications.  Continue prenatal vitamin and iron.  Increase calories and fluids while breastfeeding. ° °You may have a slight fever when your milk comes in, but it should go away on its own.  If it does not, and rises above 101.0 please call the doctor. ° °For concerns about your baby, please call your pediatrician °For breastfeeding concerns, the lactation consultant can be reached at [phone] ° ° °
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
## 2017-04-30 LAB — BPAM RBC
BLOOD PRODUCT EXPIRATION DATE: 201810292359
Blood Product Expiration Date: 201810282359
UNIT TYPE AND RH: 600
Unit Type and Rh: 600

## 2017-04-30 LAB — TYPE AND SCREEN
ABO/RH(D): A NEG
Antibody Screen: POSITIVE
UNIT DIVISION: 0
Unit division: 0

## 2017-05-01 ENCOUNTER — Ambulatory Visit: Admission: RE | Admit: 2017-05-01 | Payer: Medicaid Other | Source: Ambulatory Visit

## 2017-06-19 IMAGING — US US OB COMP LESS 14 WK
1 series · 14 of 28 positions shown · non-contrast
Comparison: None.

CLINICAL DATA: Vaginal bleeding and cramping. Quantitative beta HCG
[DATE].

EXAM:
OBSTETRIC <14 WK ULTRASOUND
TECHNIQUE: Transabdominal ultrasound was performed for evaluation of the
gestation as well as the maternal uterus and adnexal regions.

[Series 1: us ob comp less 14 wk · 0.15mm/px · 14 of 39 slices shown]
[im 2/39]
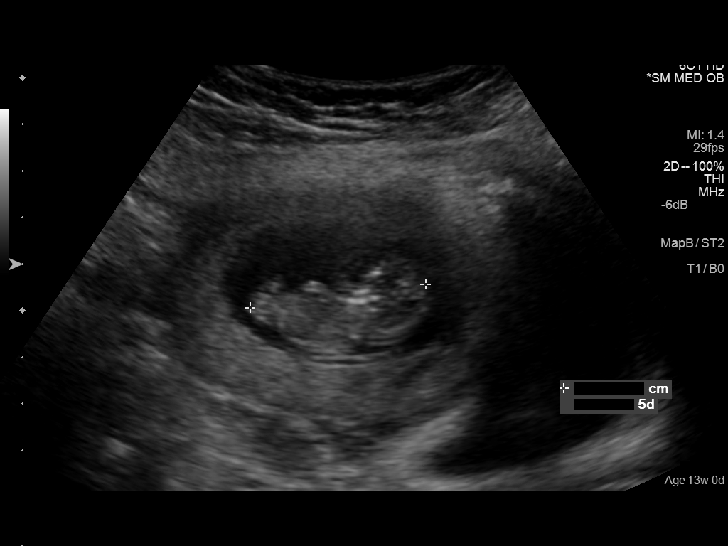
[im 5/39]
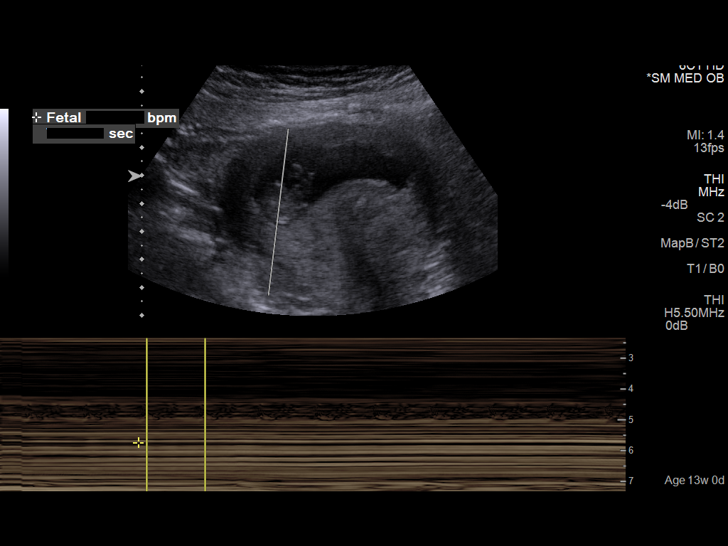
[im 8/39]
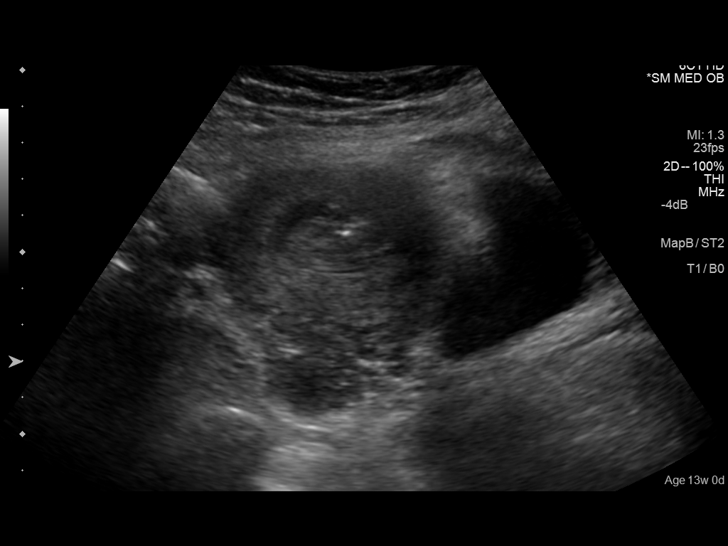
[im 10/39]
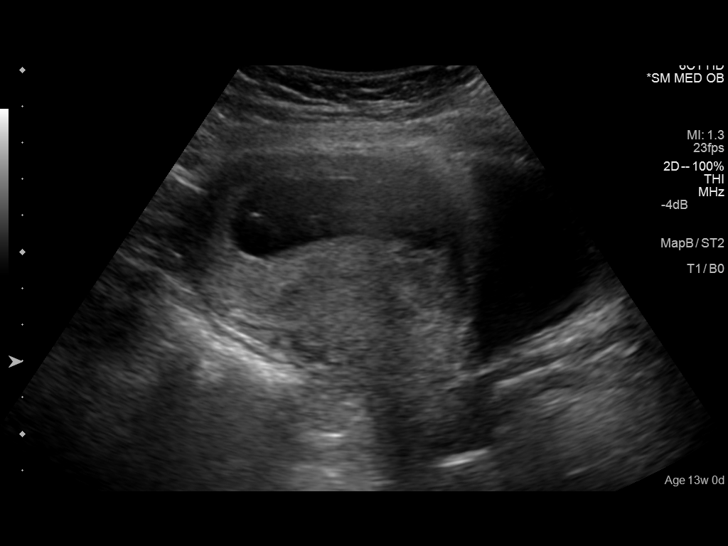
[im 13/39]
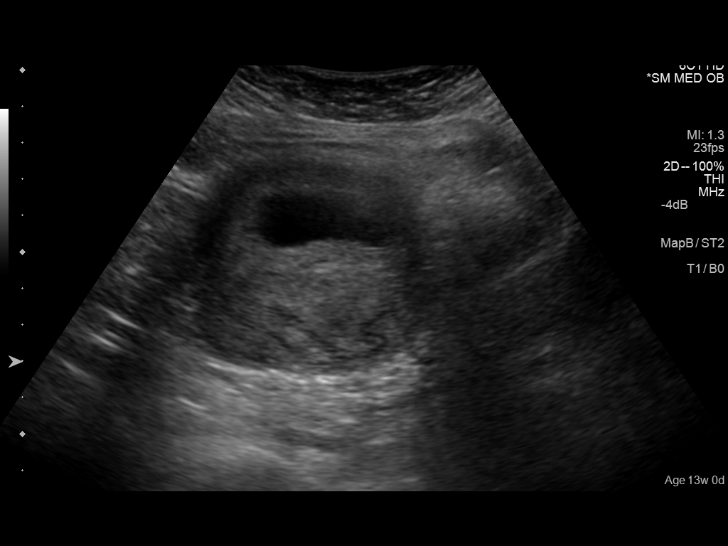
[im 16/39]
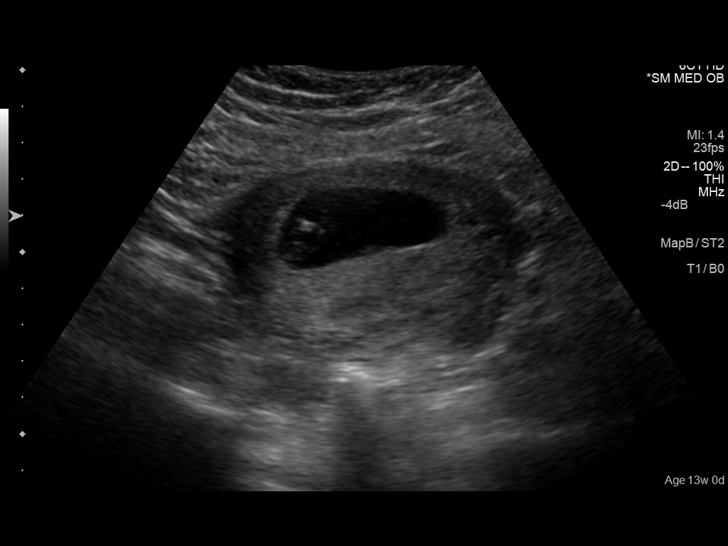
[im 19/39]
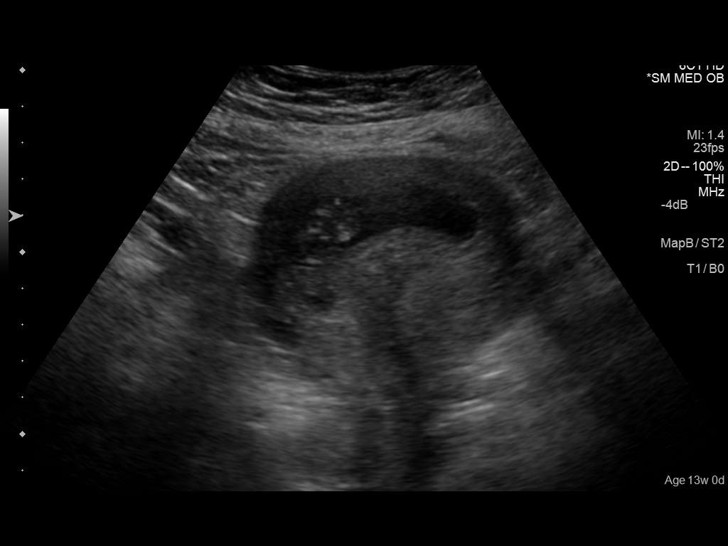
[im 22/39]
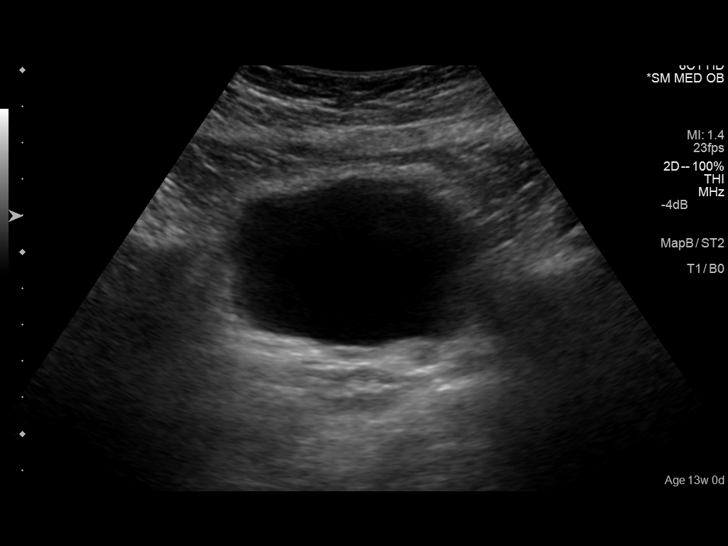
[im 24/39]
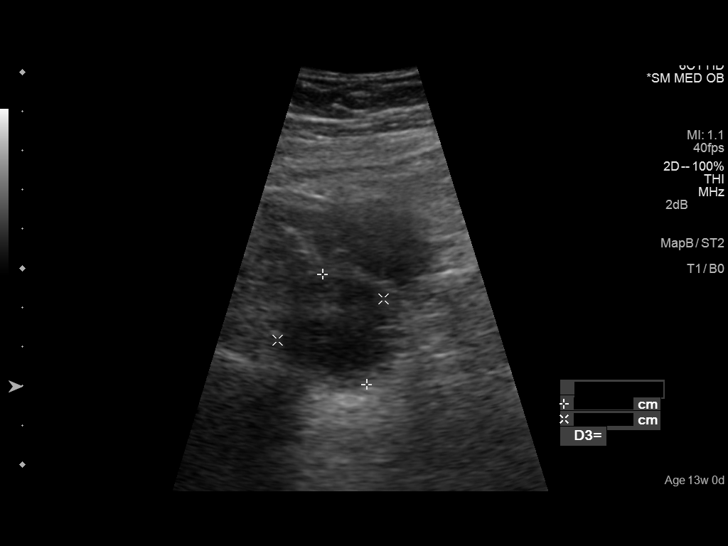
[im 27/39]
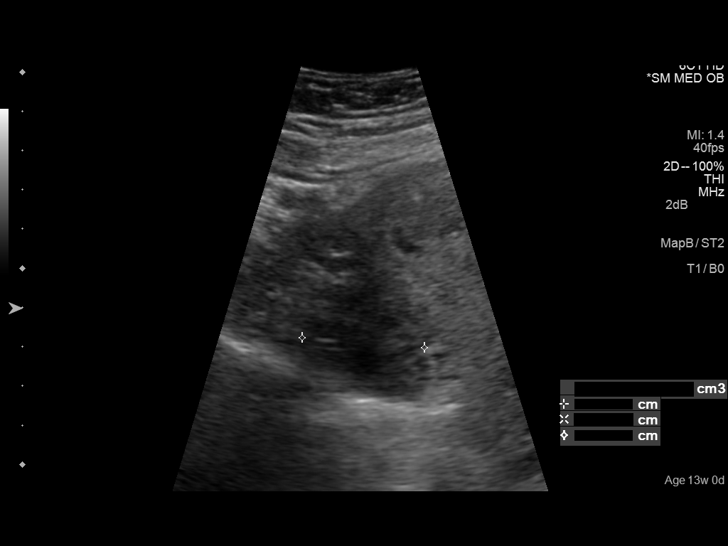
[im 30/39]
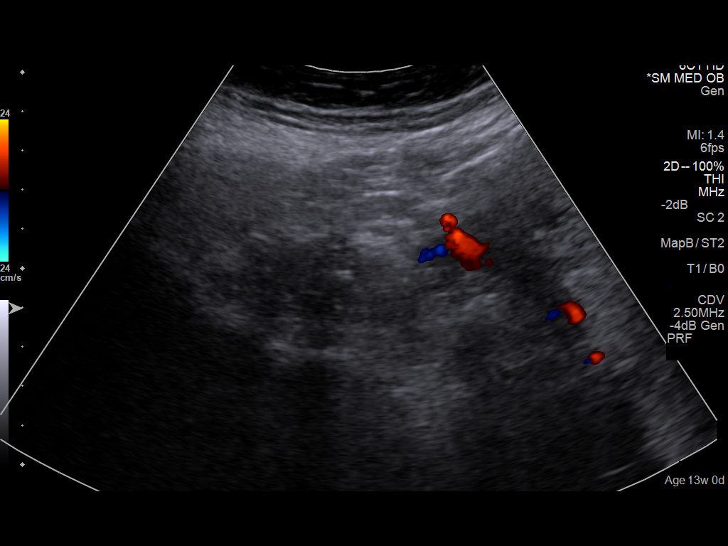
[im 33/39]
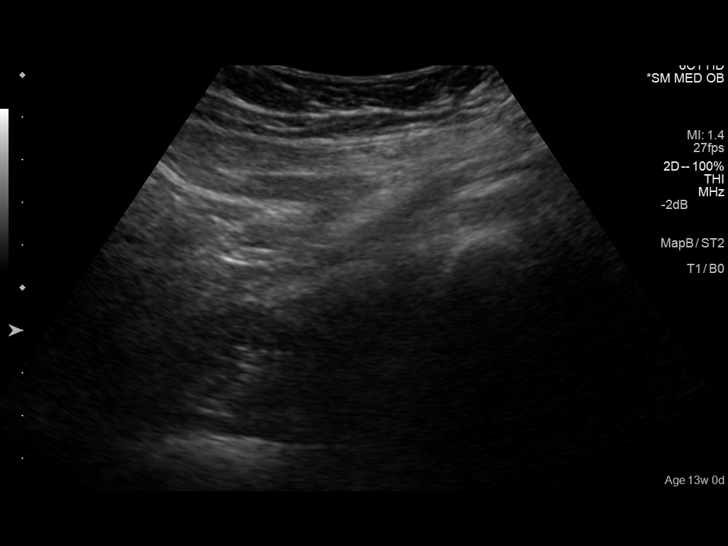
[im 36/39]
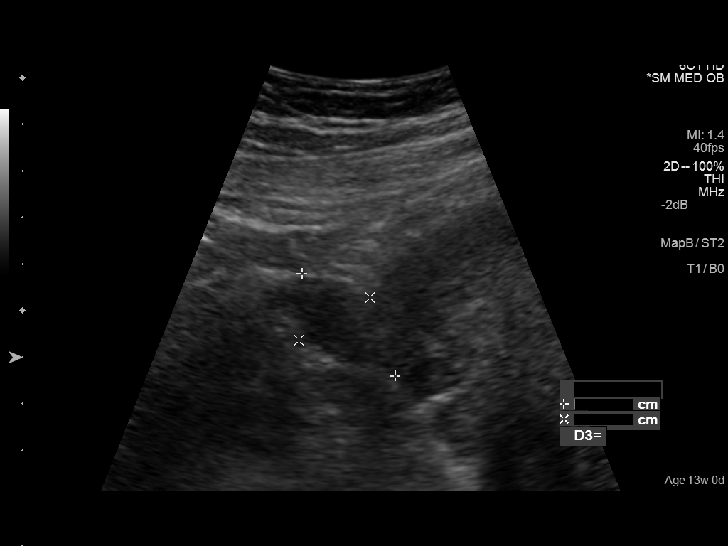
[im 39/39]
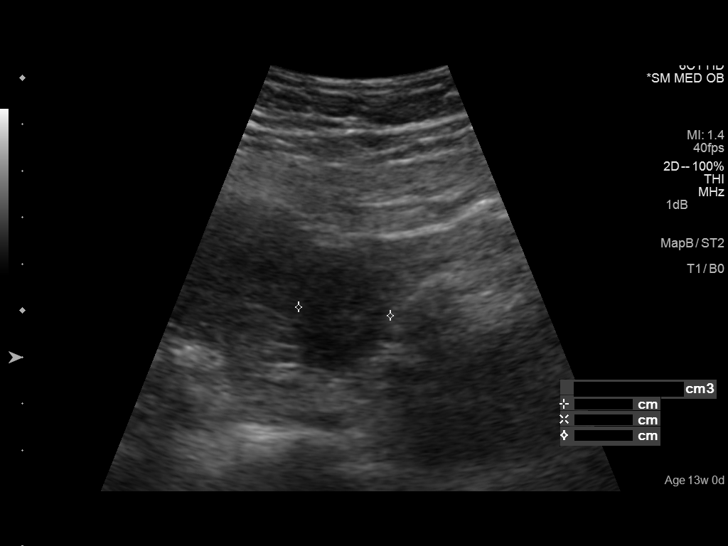

[14 of 28 positions shown; findings below may reference images not displayed]

FINDINGS: Intrauterine gestational sac: Visualized/normal in shape.

Yolk sac:  Not visualized.

Embryo:  Visualized.

Cardiac Activity: Visualized.

Heart Rate: 175 bpm

CRL:   38.2  mm   10 w 5 d                  US EDC: 10/22/2015

No evidence of subchorionic hemorrhage.

Maternal uterus/adnexae: Ovaries are within normal. No free pelvic
fluid.
IMPRESSION: Single live IUP with estimated gestational age 10 weeks 5 days.

## 2017-07-31 NOTE — L&D Delivery Note (Signed)
Delivery Note At 12:55 AM a viable female infant was delivered via Vaginal, Spontaneous (Presentation: LOA).  APGAR: 8, 9; weight pending.   Placenta status: delivered spontaneously, intact.  Cord: 3VC with the following complications: none (true knot noted).  Cord pH: n/a  Anesthesia:  epidural Episiotomy: None Lacerations: None Suture Repair: n/a Est. Blood Loss (mL): 195  Mom to postpartum.  Baby to Couplet care / Skin to Skin.  Called to see patient.  Mom pushed to deliver a viable female infant.  The head followed by shoulders, which delivered without difficulty, and the rest of the body.  No nuchal cord noted.  However, a true knot was noted.  Baby to mom's chest.  Cord clamped and cut after > 1 min delay.  No cord blood obtained.  Placenta delivered spontaneously, intact, with a 3-vessel cord.  No vaginal, cervical, or perineal lacerations. All counts correct.  Hemostasis obtained with IV pitocin and fundal massage. QBL 195 mL.     [name], MD 05/03/2018, 1:24 AM

## 2018-03-08 ENCOUNTER — Encounter: Payer: Self-pay | Admitting: *Deleted

## 2018-03-08 ENCOUNTER — Observation Stay: Payer: Medicaid Other

## 2018-03-08 ENCOUNTER — Other Ambulatory Visit: Payer: Self-pay

## 2018-03-08 ENCOUNTER — Observation Stay
Admission: EM | Admit: 2018-03-08 | Discharge: 2018-03-08 | Disposition: A | Discharge status: Home / Self Care | Payer: Medicaid Other | Attending: Obstetrics and Gynecology | Admitting: Obstetrics and Gynecology

## 2018-03-08 DIAGNOSIS — O99332 Smoking (tobacco) complicating pregnancy, second trimester: Secondary | ICD-10-CM | POA: Diagnosis not present

## 2018-03-08 DIAGNOSIS — R109 Unspecified abdominal pain: Secondary | ICD-10-CM

## 2018-03-08 DIAGNOSIS — O98812 Other maternal infectious and parasitic diseases complicating pregnancy, second trimester: Secondary | ICD-10-CM | POA: Insufficient documentation

## 2018-03-08 DIAGNOSIS — F17210 Nicotine dependence, cigarettes, uncomplicated: Secondary | ICD-10-CM | POA: Diagnosis not present

## 2018-03-08 DIAGNOSIS — O2342 Unspecified infection of urinary tract in pregnancy, second trimester: Secondary | ICD-10-CM | POA: Diagnosis not present

## 2018-03-08 DIAGNOSIS — B373 Candidiasis of vulva and vagina: Secondary | ICD-10-CM | POA: Diagnosis not present

## 2018-03-08 DIAGNOSIS — Z3A27 27 weeks gestation of pregnancy: Secondary | ICD-10-CM | POA: Diagnosis not present

## 2018-03-08 DIAGNOSIS — O9989 Other specified diseases and conditions complicating pregnancy, childbirth and the puerperium: Secondary | ICD-10-CM | POA: Diagnosis not present

## 2018-03-08 DIAGNOSIS — Z3490 Encounter for supervision of normal pregnancy, unspecified, unspecified trimester: Secondary | ICD-10-CM

## 2018-03-08 DIAGNOSIS — O26899 Other specified pregnancy related conditions, unspecified trimester: Secondary | ICD-10-CM | POA: Diagnosis present

## 2018-03-08 HISTORY — DX: Headache, unspecified: R51.9

## 2018-03-08 HISTORY — DX: Headache: R51

## 2018-03-08 LAB — WET PREP, GENITAL
CLUE CELLS WET PREP: NONE SEEN
SPERM: NONE SEEN
Trich, Wet Prep: NONE SEEN

## 2018-03-08 LAB — COMPREHENSIVE METABOLIC PANEL
ALBUMIN: 3.3 g/dL — AB (ref 3.5–5.0)
ALT: 7 U/L (ref 0–44)
AST: 16 U/L (ref 15–41)
Alkaline Phosphatase: 82 U/L (ref 38–126)
Anion gap: 6 (ref 5–15)
BILIRUBIN TOTAL: 0.4 mg/dL (ref 0.3–1.2)
BUN: 5 mg/dL — AB (ref 6–20)
CALCIUM: 9.2 mg/dL (ref 8.9–10.3)
CO2: 25 mmol/L (ref 22–32)
Chloride: 105 mmol/L (ref 98–111)
Creatinine, Ser: 0.62 mg/dL (ref 0.44–1.00)
GFR calc Af Amer: >60 mL/min (ref >60)
GFR calc non Af Amer: >60 mL/min (ref >60)
GLUCOSE: 99 mg/dL (ref 70–99)
Potassium: 3.8 mmol/L (ref 3.5–5.1)
SODIUM: 136 mmol/L (ref 135–145)
TOTAL PROTEIN: 6.8 g/dL (ref 6.5–8.1)

## 2018-03-08 LAB — URINALYSIS, ROUTINE W REFLEX MICROSCOPIC
Bilirubin Urine: NEGATIVE
GLUCOSE, UA: NEGATIVE mg/dL
HGB URINE DIPSTICK: NEGATIVE
Ketones, ur: NEGATIVE mg/dL
NITRITE: NEGATIVE
Protein, ur: NEGATIVE mg/dL
SPECIFIC GRAVITY, URINE: 1.004 — AB (ref 1.005–1.030)
pH: 7 (ref 5.0–8.0)

## 2018-03-08 LAB — CHLAMYDIA/NGC RT PCR (ARMC ONLY)
Chlamydia Tr: NOT DETECTED
N gonorrhoeae: NOT DETECTED

## 2018-03-08 LAB — CBC
HEMATOCRIT: 36.4 % (ref 35.0–47.0)
Hemoglobin: 12.5 g/dL (ref 12.0–16.0)
MCH: 33.4 pg (ref 26.0–34.0)
MCHC: 34.4 g/dL (ref 32.0–36.0)
MCV: 97.3 fL (ref 80.0–100.0)
Platelets: 246 10*3/uL (ref 150–440)
RBC: 3.74 MIL/uL — ABNORMAL LOW (ref 3.80–5.20)
RDW: 13.5 % (ref 11.5–14.5)
WBC: 12.5 10*3/uL — ABNORMAL HIGH (ref 3.6–11.0)

## 2018-03-08 LAB — FETAL FIBRONECTIN: Fetal Fibronectin: NEGATIVE

## 2018-03-08 LAB — URINE DRUG SCREEN, QUALITATIVE (ARMC ONLY)
Amphetamines, Ur Screen: NOT DETECTED
Barbiturates, Ur Screen: NOT DETECTED
CANNABINOID 50 NG, UR ~~LOC~~: NOT DETECTED
Cocaine Metabolite,Ur ~~LOC~~: NOT DETECTED
MDMA (Ecstasy)Ur Screen: NOT DETECTED
Methadone Scn, Ur: NOT DETECTED
Opiate, Ur Screen: NOT DETECTED
Phencyclidine (PCP) Ur S: NOT DETECTED
TRICYCLIC, UR SCREEN: NOT DETECTED

## 2018-03-08 LAB — ROM PLUS (ARMC ONLY): Rom Plus: NEGATIVE

## 2018-03-08 LAB — TYPE AND SCREEN
ABO/RH(D): A NEG
Antibody Screen: NEGATIVE

## 2018-03-08 LAB — HEPATITIS B SURFACE ANTIGEN: Hepatitis B Surface Ag: NEGATIVE

## 2018-03-08 IMAGING — CR DG CHEST 2V
1 series · 2 of 2 positions shown · non-contrast
Comparison: Chest x-ray dated 12/07/2014.

CLINICAL DATA: complaints of new onset centralized chest pain and
tremors today while at work, smoker

EXAM:
CHEST  2 VIEW

[Series 1: dg chest 2 view · 0.14mm/px · 2 of 2 slices shown]
[im 1/2]
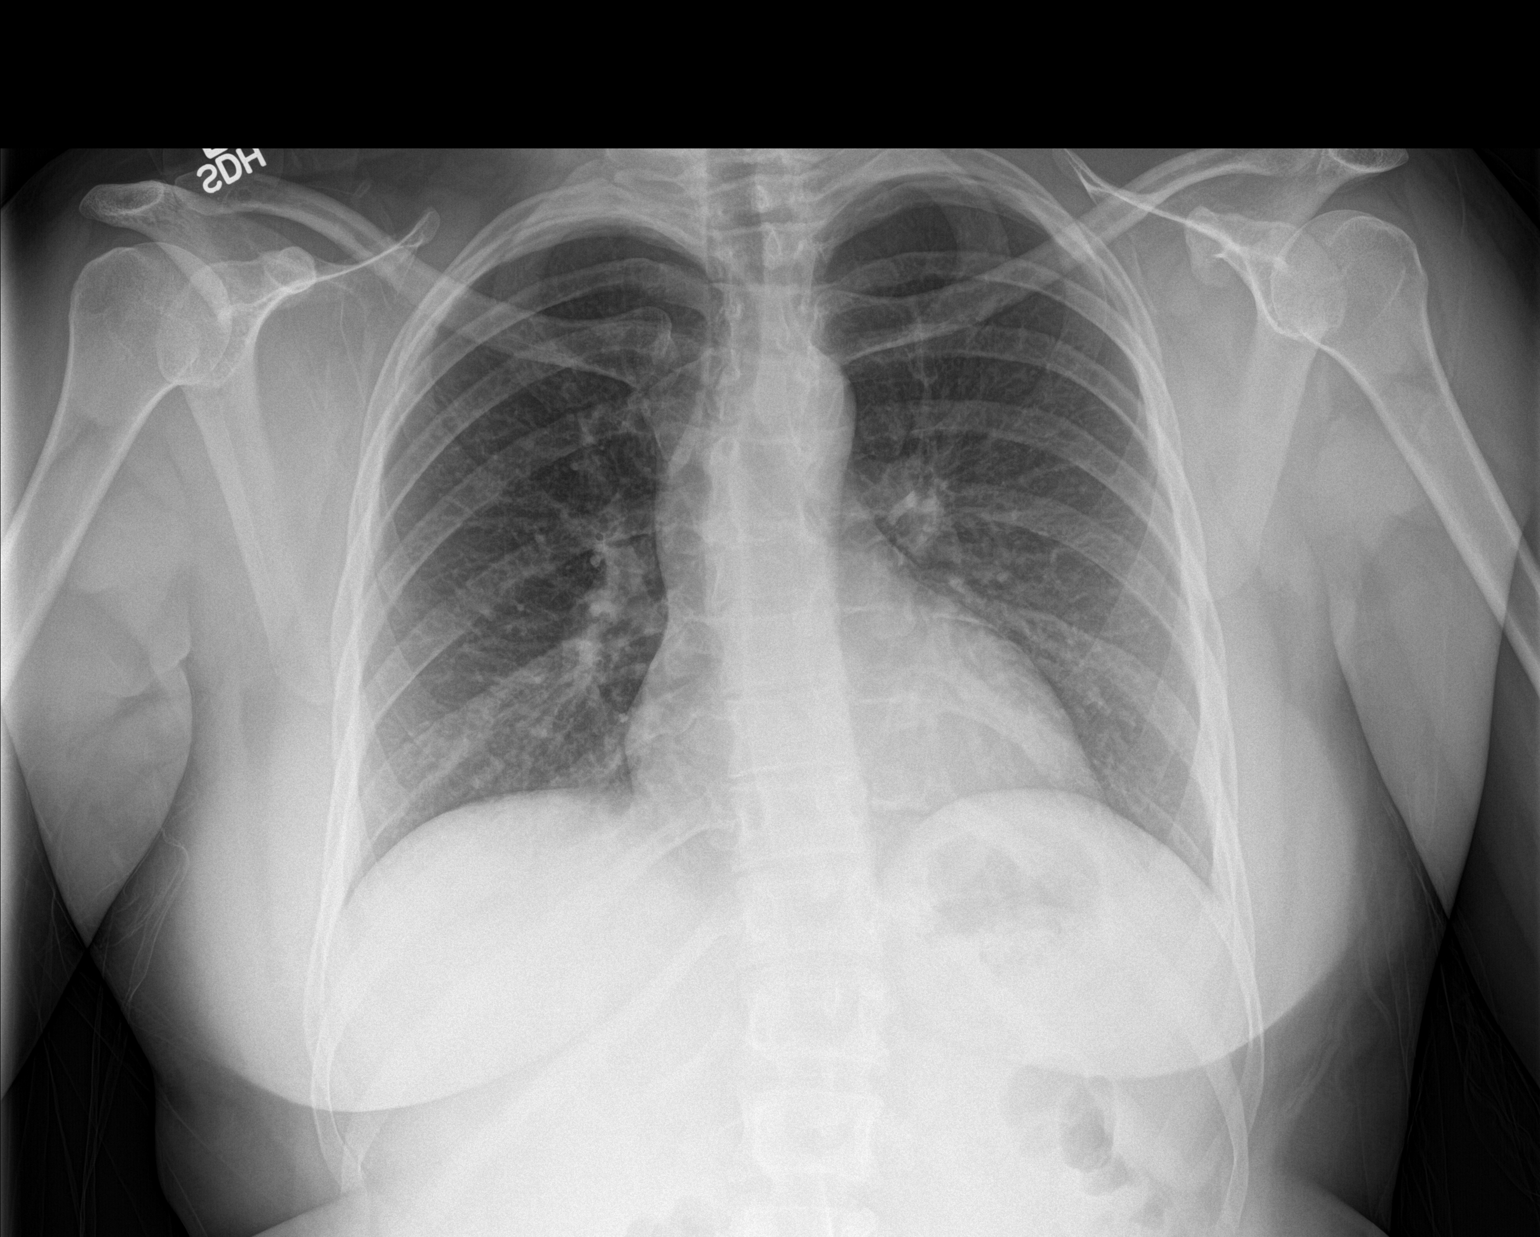
[im 2/2]
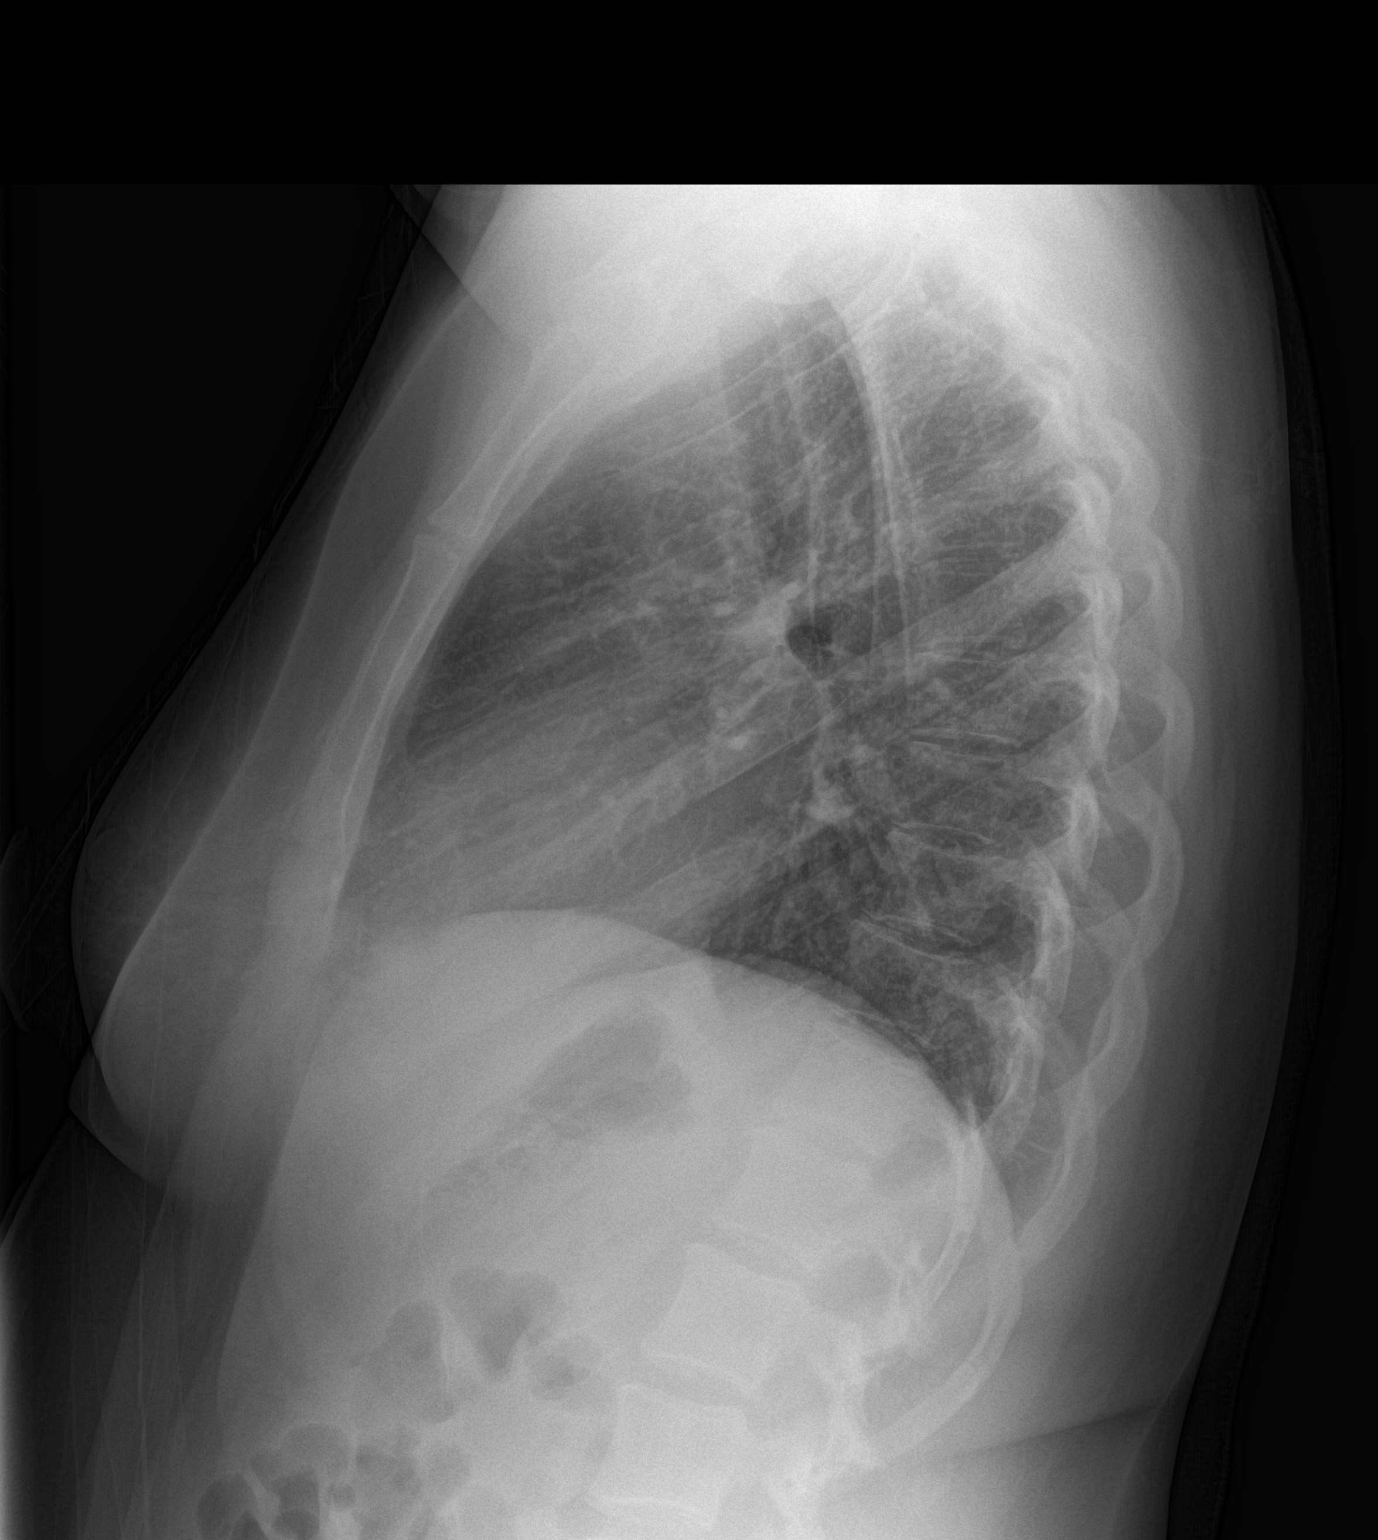

[2 of 2 positions shown; findings below may reference images not displayed]

FINDINGS: Study is slightly hypoinspiratory with crowding of the perihilar and
bibasilar bronchovascular markings. Given the low lung volumes,
lungs are clear. No evidence of pneumonia. No pleural effusion or
pneumothorax seen. Heart size and mediastinal contours are normal.
Osseous structures about the chest are unremarkable.
IMPRESSION: Low lung volumes. No active cardiopulmonary disease. No evidence of
pneumonia or pulmonary edema.

## 2018-03-08 MED ORDER — OXYTOCIN BOLUS FROM INFUSION: 500.0000 mL | Freq: Once | INTRAVENOUS | Status: DC

## 2018-03-08 MED ORDER — TRAMADOL HCL 50 MG PO TABS: 50.0000 mg | ORAL_TABLET | Freq: Four times a day (QID) | ORAL | 0 refills | Status: AC | PRN

## 2018-03-08 MED ORDER — OXYTOCIN 40 UNITS IN LACTATED RINGERS INFUSION - SIMPLE MED: 2.5000 [IU]/h | INTRAVENOUS | Status: DC

## 2018-03-08 MED ORDER — CEPHALEXIN 500 MG PO CAPS: 500.0000 mg | ORAL_CAPSULE | Freq: Two times a day (BID) | ORAL | 0 refills | Status: AC

## 2018-03-08 MED ORDER — TERCONAZOLE 0.4 % VA CREA: 1.0000 | TOPICAL_CREAM | Freq: Every day | VAGINAL | 0 refills | Status: AC

## 2018-03-08 NOTE — Discharge Summary (Signed)
See Final Progress Note 03/08/2018. 

## 2018-03-08 NOTE — ED Triage Notes (Signed)
C/O lower abdominal pain / pelvic pain.  Onset this am.  Possibly leaking fluid.  LMP:  "late January early February".  Has not had any prenatal care and does not know how many week gestation she is.  G7 P6.

## 2018-03-08 NOTE — OB Triage Note (Signed)
Gush of fluid @ 0630 this am. Reports having 2 other episodes of leaking fluid since then. Denies vaginal bleeding. Denies foul odor. Reports good fetal movement. Pt reports no PNC due to not having pregnancy medicaid. Elaina HoopsElks, Holland Kotter S

## 2018-03-08 NOTE — Final Progress Note (Addendum)
Physician Final Progress Note  Patient ID: Eileen Tate MRN: [id number] DOB/AGE: 04-02-1986 32 y.o.  Admit date: 03/08/2018 Admitting provider: Natale Milch, MD Discharge date: 03/08/2018  Admission Diagnoses: Abdominal pain in pregnancy, possible PPROM  Discharge Diagnoses:  Active Problems:   Abdominal pain affecting pregnancy   History of Present Illness: The patient is a 32 y.o. female [phone] who presents for feeling leakage of watery discharge on several occasions, beginning at 0630 this morning. She also complains of intermittent suprapubic pain (rated as 7/10) and back pain (rated as 8/10). She tried taking Tylenol for the pain without relief. She denies vaginal bleeding. She reports good fetal movement.  She has not had any prenatal care during this pregnancy.   Review of systems negative unless otherwise noted in HPI.  Past Medical History:  Diagnosis Date  . Abscess and cellulitis   . Asthma   . Headache   . Heart murmur   . Tobacco use disorder   . UTI (lower urinary tract infection)     Past Surgical History:  Procedure Laterality Date  . NO PAST SURGERIES      No current facility-administered medications on file prior to encounter.    Current Outpatient Medications on File Prior to Encounter  Medication Sig Dispense Refill  . acetaminophen (TYLENOL) 325 MG tablet Take 2 tablets (650 mg total) by mouth every 4 (four) hours as needed (for pain scale < 4). 61 tablet 1  . albuterol (PROVENTIL HFA;VENTOLIN HFA) 108 (90 BASE) MCG/ACT inhaler Inhale 2 puffs into the lungs every 6 (six) hours as needed for wheezing or shortness of breath. (Patient not taking: Reported on 04/27/2017) 1 Inhaler 0  . cephALEXin (KEFLEX) 500 MG capsule Take 1 capsule (500 mg total) by mouth 3 (three) times daily. (Patient not taking: Reported on 04/27/2017) 30 capsule 0  . levalbuterol (XOPENEX HFA) 45 MCG/ACT inhaler Inhale 1 puff into the lungs daily as needed. For shortness  of breath    . norethindrone-ethinyl estradiol 1/35 (ORTHO-NOVUM 1/35, 28,) tablet Take 1 tablet by mouth daily. (Patient not taking: Reported on 04/27/2017) 1 Package 2  . Prenatal Vit-Fe Fumarate-FA (PRENATAL MULTIVITAMIN) TABS Take 1 tablet by mouth at bedtime. Reported on 10/09/2015    . silver sulfADIAZINE (SILVADENE) 1 % cream Apply 1 application topically daily. (Patient not taking: Reported on 04/27/2017) 50 g 0  . traMADol (ULTRAM) 50 MG tablet Take 1 tablet (50 mg total) by mouth every 6 (six) hours as needed. (Patient not taking: Reported on 04/27/2017) 12 tablet 0    Allergies  Allergen Reactions  . Excedrin Extra Strength [Aspirin-Acetaminophen-Caffeine] Swelling    Throat swelling  . Guaifenesin & Derivatives Swelling  . Ibuprofen Swelling and Other (See Comments)    Causes blisters    Social History   Socioeconomic History  . Marital status: Married    Spouse name: Richard  . Number of children: 6  . Years of education: Not on file  . Highest education level: Not on file  Occupational History  . Occupation: unemployed  Social Needs  . Financial resource strain: Not on file  . Food insecurity:    Worry: Not on file    Inability: Not on file  . Transportation needs:    Medical: Yes    Non-medical: Yes  Tobacco Use  . Smoking status: Current Every Day Smoker    Packs/day: 0.25    Years: 9.00    Pack years: 2.25    Types: Cigarettes  .  Smokeless tobacco: Never Used  Substance and Sexual Activity  . Alcohol use: No    Alcohol/week: 1.0 standard drinks    Types: 1 Cans of beer per week  . Drug use: No  . Sexual activity: Yes    Birth control/protection: Surgical  Lifestyle  . Physical activity:    Days per week: Not on file    Minutes per session: Not on file  . Stress: Not on file  Relationships  . Social connections:    Talks on phone: Not on file    Gets together: Not on file    Attends religious service: Not on file    Active member of club or  organization: Not on file    Attends meetings of clubs or organizations: Not on file    Relationship status: Not on file  . Intimate partner violence:    Fear of current or ex partner: Not on file    Emotionally abused: Not on file    Physically abused: Not on file    Forced sexual activity: Not on file  Other Topics Concern  . Not on file  Social History Narrative   Joint custody of 2 daughters with ex-husband   Step-daughter in home with 4 other children    Physical Exam: BP 117/76 (BP Location: Left Arm)   Pulse 91   Temp 98.4 F (36.9 C) (Oral)   Resp 16   Ht 5' (1.524 m)   Wt 73 kg   LMP 08/26/2017 (Approximate)   SpO2 99%   BMI 31.44 kg/m   Gen: NAD CV: Regular rate Pulm: No increased work of breathing Abdomen: Soft, gravid, fundal height 30 cm Back: CVAT on left side Pelvic: SVE: closed/thick/high. SSE: some watery whitish discharge present, no blood. Negative nitrazine, negative ferning, negative ROM Plus. Ext: no signs of DVT  NST: Baseline: 135 Variability: moderate Accelerations: present Decelerations: absent Tocometry: occasional irritability The patient was monitored for 30+ minutes, fetal heart rate tracing was deemed reactive.  Consults: None  Significant Findings/ Diagnostic Studies: labs: UA positive for leukocytes, wet prep positive for yeast. Fetal fibronectin negative. Urine culture and other labs pending. Ultrasound dating at 27w 6d, AFI 22.6 cm.  Procedures: NST, SSE, Ultrasound  Discharge Condition: good  Disposition: Discharge disposition: 01-Home or Self Care       Diet: Regular diet  Discharge Activity: Activity as tolerated  Discharge Instructions    Discharge activity:  No Restrictions   Complete by:  As directed    Discharge diet:  No restrictions   Complete by:  As directed    No sexual activity restrictions   Complete by:  As directed    Notify physician for a general feeling that "something is not right"   Complete  by:  As directed    Notify physician for increase or change in vaginal discharge   Complete by:  As directed    Notify physician for intestinal cramps, with or without diarrhea, sometimes described as "gas pain"   Complete by:  As directed    Notify physician for leaking of fluid   Complete by:  As directed    Notify physician for low, dull backache, unrelieved by heat or Tylenol   Complete by:  As directed    Notify physician for menstrual like cramps   Complete by:  As directed    Notify physician for pelvic pressure   Complete by:  As directed    Notify physician for uterine contractions.  These may  be painless and feel like the uterus is tightening or the baby is  "balling up"   Complete by:  As directed    Notify physician for vaginal bleeding   Complete by:  As directed    PRETERM LABOR:  Includes any of the follwing symptoms that occur between 20 - [redacted] weeks gestation.  If these symptoms are not stopped, preterm labor can result in preterm delivery, placing your baby at risk   Complete by:  As directed      Allergies as of 03/08/2018      Reactions   Excedrin Extra Strength [aspirin-acetaminophen-caffeine] Swelling   Throat swelling   Guaifenesin & Derivatives Swelling   Ibuprofen Swelling, Other (See Comments)   Causes blisters      Medication List    STOP taking these medications   acetaminophen 325 MG tablet Commonly known as:  TYLENOL   norethindrone-ethinyl estradiol 1/35 tablet Commonly known as:  ORTHO-NOVUM, NORTREL,CYCLAFEM   silver sulfADIAZINE 1 % cream Commonly known as:  SILVADENE     TAKE these medications   albuterol 108 (90 Base) MCG/ACT inhaler Commonly known as:  PROVENTIL HFA;VENTOLIN HFA Inhale 2 puffs into the lungs every 6 (six) hours as needed for wheezing or shortness of breath.   cephALEXin 500 MG capsule Commonly known as:  KEFLEX Take 1 capsule (500 mg total) by mouth 2 (two) times daily for 7 days. What changed:  when to take this    levalbuterol 45 MCG/ACT inhaler Commonly known as:  XOPENEX HFA Inhale 1 puff into the lungs daily as needed. For shortness of breath   prenatal multivitamin Tabs tablet Take 1 tablet by mouth at bedtime. Reported on 10/09/2015   terconazole 0.4 % vaginal cream Commonly known as:  TERAZOL 7 Place 1 applicator vaginally at bedtime for 7 days.   traMADol 50 MG tablet Commonly known as:  ULTRAM Take 1 tablet (50 mg total) by mouth every 6 (six) hours as needed for up to 5 days.      Follow-up Information    Oswaldo ConroySchmid, Javis Abboud Y, CNM. Schedule an appointment as soon as possible for a visit.   Specialty:  Certified Nurse Midwife Contact information: [address] [place] [address] [phone]          Treat for yeast infection and UTI. Patient to follow-up in office for prenatal care.  Signed: Oswaldo ConroyJacelyn Y Lucas Winograd, CNM  03/08/2018, 6:29 PM

## 2018-03-10 LAB — HIV ANTIBODY (ROUTINE TESTING W REFLEX): HIV SCREEN 4TH GENERATION: NONREACTIVE

## 2018-03-10 LAB — MEASLES/MUMPS/RUBELLA IMMUNITY: Rubella: 30.8 index (ref >0.99)

## 2018-03-10 LAB — RPR: RPR: NONREACTIVE

## 2018-03-10 LAB — VARICELLA ZOSTER ANTIBODY, IGG: VARICELLA IGG: 373 {index} (ref >165)

## 2018-03-10 LAB — URINE CULTURE

## 2018-03-10 LAB — CULTURE, BETA STREP (GROUP B ONLY)

## 2018-03-11 ENCOUNTER — Telehealth: Payer: Self-pay | Admitting: Maternal Newborn

## 2018-03-11 NOTE — Telephone Encounter (Signed)
Patient is schedule 03/12/18 for NOB

## 2018-03-11 NOTE — Telephone Encounter (Signed)
Called and left voicemail for patient to call back to be schedule Per Jaci patient needs an NOB with any provider

## 2018-03-12 ENCOUNTER — Other Ambulatory Visit: Payer: Self-pay | Admitting: Maternal Newborn

## 2018-03-12 ENCOUNTER — Encounter: Payer: Medicaid Other | Admitting: Maternal Newborn

## 2018-03-12 ENCOUNTER — Telehealth: Payer: Self-pay | Admitting: Maternal Newborn

## 2018-03-12 DIAGNOSIS — O0993 Supervision of high risk pregnancy, unspecified, third trimester: Secondary | ICD-10-CM | POA: Insufficient documentation

## 2018-03-12 NOTE — Telephone Encounter (Signed)
Patient is calling to speak with Eileen Tate about her appointment today. Patient was late and told she needed to reschedule. Patient still hasn't reschedule and would like a call from [place] about her care. Please advise

## 2018-03-13 NOTE — Telephone Encounter (Signed)
Patient is schedule 03/22/18 for GTT and NOB

## 2018-03-13 NOTE — Telephone Encounter (Signed)
Called and left message for patient to call back to be schedule for NOB

## 2018-03-17 ENCOUNTER — Observation Stay
Admission: EM | Admit: 2018-03-17 | Discharge: 2018-03-17 | Disposition: A | Discharge status: Home / Self Care | Payer: Medicaid Other | Attending: Obstetrics and Gynecology | Admitting: Obstetrics and Gynecology

## 2018-03-17 ENCOUNTER — Other Ambulatory Visit: Payer: Self-pay

## 2018-03-17 DIAGNOSIS — O2343 Unspecified infection of urinary tract in pregnancy, third trimester: Secondary | ICD-10-CM | POA: Diagnosis present

## 2018-03-17 DIAGNOSIS — O99333 Smoking (tobacco) complicating pregnancy, third trimester: Secondary | ICD-10-CM | POA: Diagnosis not present

## 2018-03-17 DIAGNOSIS — R109 Unspecified abdominal pain: Secondary | ICD-10-CM

## 2018-03-17 DIAGNOSIS — Z79899 Other long term (current) drug therapy: Secondary | ICD-10-CM | POA: Insufficient documentation

## 2018-03-17 DIAGNOSIS — F17210 Nicotine dependence, cigarettes, uncomplicated: Secondary | ICD-10-CM | POA: Diagnosis not present

## 2018-03-17 DIAGNOSIS — M545 Low back pain: Secondary | ICD-10-CM | POA: Diagnosis not present

## 2018-03-17 DIAGNOSIS — B9620 Unspecified Escherichia coli [E. coli] as the cause of diseases classified elsewhere: Secondary | ICD-10-CM | POA: Diagnosis not present

## 2018-03-17 DIAGNOSIS — O0993 Supervision of high risk pregnancy, unspecified, third trimester: Secondary | ICD-10-CM

## 2018-03-17 DIAGNOSIS — N390 Urinary tract infection, site not specified: Secondary | ICD-10-CM | POA: Diagnosis present

## 2018-03-17 DIAGNOSIS — Z3A29 29 weeks gestation of pregnancy: Secondary | ICD-10-CM | POA: Diagnosis not present

## 2018-03-17 DIAGNOSIS — O9989 Other specified diseases and conditions complicating pregnancy, childbirth and the puerperium: Secondary | ICD-10-CM | POA: Diagnosis not present

## 2018-03-17 LAB — URINALYSIS, COMPLETE (UACMP) WITH MICROSCOPIC
Bilirubin Urine: NEGATIVE
Glucose, UA: NEGATIVE mg/dL
Hgb urine dipstick: NEGATIVE
Ketones, ur: NEGATIVE mg/dL
Leukocytes, UA: NEGATIVE
Nitrite: NEGATIVE
Protein, ur: NEGATIVE mg/dL
Specific Gravity, Urine: 1.003 — ABNORMAL LOW (ref 1.005–1.030)
pH: 7 (ref 5.0–8.0)

## 2018-03-17 LAB — CHLAMYDIA/NGC RT PCR (ARMC ONLY)
CHLAMYDIA TR: NOT DETECTED
N gonorrhoeae: NOT DETECTED

## 2018-03-17 LAB — ROM PLUS (ARMC ONLY): Rom Plus: NEGATIVE

## 2018-03-17 MED ORDER — HYOSCYAMINE SULFATE 0.125 MG PO TABS: 0.1250 mg | ORAL_TABLET | ORAL | 0 refills | Status: DC | PRN

## 2018-03-17 MED ORDER — ACETAMINOPHEN 325 MG PO TABS
650.0000 mg | ORAL_TABLET | ORAL | Status: DC | PRN
  Administered 2018-03-17: 650 mg via ORAL

## 2018-03-17 MED ORDER — AMOXICILLIN-POT CLAVULANATE 875-125 MG PO TABS: 1.0000 | ORAL_TABLET | Freq: Two times a day (BID) | ORAL | 0 refills | Status: AC

## 2018-03-17 MED ORDER — BUTALBITAL-APAP-CAFFEINE 50-325-40 MG PO TABS: 1.0000 | ORAL_TABLET | Freq: Four times a day (QID) | ORAL | 0 refills | Status: DC | PRN

## 2018-03-17 MED ORDER — ACETAMINOPHEN 325 MG PO TABS
ORAL_TABLET | ORAL | Status: AC
  Administered 2018-03-17: 650 mg via ORAL
  Filled 2018-03-17: qty 2

## 2018-03-17 MED ORDER — CYCLOBENZAPRINE HCL 10 MG PO TABS: 10.0000 mg | ORAL_TABLET | Freq: Three times a day (TID) | ORAL | 0 refills | Status: DC | PRN

## 2018-03-17 NOTE — OB Triage Note (Signed)
Pt came in at 05:10AM via EMS from home c/o suprapubic pain and lower back pain with rectal pressure and occasional LOF since 03:45AM. Pt reports positive fetal movement and denies vaginal bleeding. Monitors applied and assessing.

## 2018-03-17 NOTE — Discharge Summary (Signed)
See Final Progress Note 03/17/2018.

## 2018-03-17 NOTE — Final Progress Note (Signed)
Physician Final Progress Note  Patient ID: Eileen MarionSusan Ann Tate MRN: [id number] DOB/AGE: 01-Jun-1986 32 y.o.  Admit date: 03/17/2018 Admitting provider: Adelene Idlerhristanna Schuman, MD Discharge date: 03/17/2018   Admission Diagnoses: Abdominal pain, rule out rupture of membranes  Discharge Diagnoses:  Active Problems:   Urinary tract infection    History of Present Illness: The patient is a 32 y.o. female [id number] at 4677w1d who presents for suprapubic pain and low back pain (rates both 6/10) and a feeling of pressure in the rectal area. She has taken Tramadol and Tylenol without relief. Occasional leaking fluid. No vaginal bleeding. Good fetal movement. Recently completed course of antibiotics. Also having some headaches at home, though none in triage. Denies visual changes and epigastric pain.  Review of systems negative unless otherwise noted in HPI.  Past Medical History:  Diagnosis Date  . Abscess and cellulitis   . Asthma   . Headache   . Heart murmur   . Tobacco use disorder   . UTI (lower urinary tract infection)     Past Surgical History:  Procedure Laterality Date  . NO PAST SURGERIES      No current facility-administered medications on file prior to encounter.    Current Outpatient Medications on File Prior to Encounter  Medication Sig Dispense Refill  . Prenatal Vit-Fe Fumarate-FA (PRENATAL MULTIVITAMIN) TABS Take 1 tablet by mouth at bedtime. Reported on 10/09/2015    . albuterol (PROVENTIL HFA;VENTOLIN HFA) 108 (90 BASE) MCG/ACT inhaler Inhale 2 puffs into the lungs every 6 (six) hours as needed for wheezing or shortness of breath. (Patient not taking: Reported on 04/27/2017) 1 Inhaler 0  . levalbuterol (XOPENEX HFA) 45 MCG/ACT inhaler Inhale 1 puff into the lungs daily as needed. For shortness of breath      Allergies  Allergen Reactions  . Excedrin Extra Strength [Aspirin-Acetaminophen-Caffeine] Swelling    Throat swelling  . Guaifenesin & Derivatives Swelling  .  Ibuprofen Swelling and Other (See Comments)    Causes blisters    Social History   Socioeconomic History  . Marital status: Married    Spouse name: Richard  . Number of children: 6  . Years of education: Not on file  . Highest education level: Not on file  Occupational History  . Occupation: unemployed  Social Needs  . Financial resource strain: Not on file  . Food insecurity:    Worry: Not on file    Inability: Not on file  . Transportation needs:    Medical: Yes    Non-medical: Yes  Tobacco Use  . Smoking status: Current Every Day Smoker    Packs/day: 0.25    Years: 9.00    Pack years: 2.25    Types: Cigarettes  . Smokeless tobacco: Never Used  Substance and Sexual Activity  . Alcohol use: No    Alcohol/week: 1.0 standard drinks    Types: 1 Cans of beer per week  . Drug use: No  . Sexual activity: Yes    Birth control/protection: Surgical  Lifestyle  . Physical activity:    Days per week: Not on file    Minutes per session: Not on file  . Stress: Not on file  Relationships  . Social connections:    Talks on phone: Not on file    Gets together: Not on file    Attends religious service: Not on file    Active member of club or organization: Not on file    Attends meetings of clubs or organizations: Not on file  Relationship status: Not on file  . Intimate partner violence:    Fear of current or ex partner: Not on file    Emotionally abused: Not on file    Physically abused: Not on file    Forced sexual activity: Not on file  Other Topics Concern  . Not on file  Social History Narrative   Joint custody of 2 daughters with ex-husband   Step-daughter in home with 4 other children    Physical Exam: BP 101/66   Pulse 87   Temp 98.4 F (36.9 C) (Oral)   Resp 16   LMP 08/26/2017 (Approximate)   Gen: NAD CV: Regular rate Pulm: No increased work of breathing or dyspnea Pelvic: SSE: Cervix visually closed, moderate amount of white discharge. Nitrazine  negative, pooling negative, ferning negative on microscopy. Ext: No signs of DVT  NST Baseline: 135 Variability: moderate Accelerations: present Decelerations: absent Tocometry: contractions not present The patient was monitored for 30+ minutes, fetal heart rate tracing was deemed reactive.  Consults: None  Significant Findings/ Diagnostic Studies: labs: UA with many bacteria. ROM Plus Negative. G/C negative.   Procedures: Sterile speculum exam, NST  Discharge Condition: good  Disposition:   Diet: Regular diet  Discharge Activity: Activity as tolerated  Discharge Instructions    Discharge activity:  No Restrictions   Complete by:  As directed    Discharge diet:  No restrictions   Complete by:  As directed    No sexual activity restrictions   Complete by:  As directed    Notify physician for a general feeling that "something is not right"   Complete by:  As directed    Notify physician for increase or change in vaginal discharge   Complete by:  As directed    Notify physician for intestinal cramps, with or without diarrhea, sometimes described as "gas pain"   Complete by:  As directed    Notify physician for leaking of fluid   Complete by:  As directed    Notify physician for low, dull backache, unrelieved by heat or Tylenol   Complete by:  As directed    Notify physician for menstrual like cramps   Complete by:  As directed    Notify physician for pelvic pressure   Complete by:  As directed    Notify physician for uterine contractions.  These may be painless and feel like the uterus is tightening or the baby is  "balling up"   Complete by:  As directed    Notify physician for vaginal bleeding   Complete by:  As directed    PRETERM LABOR:  Includes any of the follwing symptoms that occur between 20 - [redacted] weeks gestation.  If these symptoms are not stopped, preterm labor can result in preterm delivery, placing your baby at risk   Complete by:  As directed       Allergies as of 03/17/2018      Reactions   Excedrin Extra Strength [aspirin-acetaminophen-caffeine] Swelling   Throat swelling   Guaifenesin & Derivatives Swelling   Ibuprofen Swelling, Other (See Comments)   Causes blisters      Medication List    TAKE these medications   albuterol 108 (90 Base) MCG/ACT inhaler Commonly known as:  PROVENTIL HFA;VENTOLIN HFA Inhale 2 puffs into the lungs every 6 (six) hours as needed for wheezing or shortness of breath.   amoxicillin-clavulanate 875-125 MG tablet Commonly known as:  AUGMENTIN Take 1 tablet by mouth 2 (two) times daily for  14 days.   butalbital-acetaminophen-caffeine 50-325-40 MG tablet Commonly known as:  FIORICET, ESGIC Take 1-2 tablets by mouth every 6 (six) hours as needed for headache.   hyoscyamine 0.125 MG tablet Commonly known as:  LEVSIN, ANASPAZ Take 1 tablet (0.125 mg total) by mouth every 4 (four) hours as needed.   levalbuterol 45 MCG/ACT inhaler Commonly known as:  XOPENEX HFA Inhale 1 puff into the lungs daily as needed. For shortness of breath   prenatal multivitamin Tabs tablet Take 1 tablet by mouth at bedtime. Reported on 10/09/2015      Rx for Augmentin, urine culture pending. Advised her that symptoms are consistent with leaking urine and offered pelvic floor physical therapy. She is unsure whether she could attend these visits. Fioricet Rx for headaches. To be seen in office for initial prenatal visit.  Signed: Oswaldo ConroyJacelyn Y Schmid, CNM  03/17/2018

## 2018-03-19 ENCOUNTER — Other Ambulatory Visit: Payer: Self-pay | Admitting: Obstetrics and Gynecology

## 2018-03-19 DIAGNOSIS — N3000 Acute cystitis without hematuria: Secondary | ICD-10-CM

## 2018-03-19 LAB — URINE CULTURE

## 2018-03-19 MED ORDER — NITROFURANTOIN MONOHYD MACRO 100 MG PO CAPS
100.0000 mg | ORAL_CAPSULE | Freq: Two times a day (BID) | ORAL | 1 refills | Status: DC
Start: 2018-03-19 — End: 2018-04-25

## 2018-03-19 NOTE — Progress Notes (Signed)
This is a triage patient you saw who I believe you sent home with antibiotics? Thanks, Dr. Jerene PitchSchuman

## 2018-03-19 NOTE — Progress Notes (Signed)
I Called patient and prescribed macrobid

## 2018-03-19 NOTE — Progress Notes (Signed)
Discussed antibiotic change with patient over the phone. She will pick up new medication today.  Eileen Idlerhristanna Kinta Martis MD [facility], [facility] 03/19/18 12:23 PM

## 2018-03-19 NOTE — Progress Notes (Signed)
Your triage patient results.

## 2018-03-19 NOTE — Progress Notes (Signed)
Looks like she may need to switch antibiotics. I can call her about that.

## 2018-03-20 ENCOUNTER — Telehealth: Payer: Self-pay

## 2018-03-20 NOTE — Telephone Encounter (Signed)
She was prescribed the correct dose of 100mg  every 12 hours for 7 days. Please call and let he know.  Thank you, Dr. Jerene PitchSchuman

## 2018-03-20 NOTE — Telephone Encounter (Signed)
Pt aware.

## 2018-03-20 NOTE — Telephone Encounter (Signed)
Pt was on augmentin and switched to Macrobid BID.  The pharmacist told pt they thought it was too strong to take BID.  Pt is wanting to know what she is supposed to do and how far apart to take them.  [phone] ok to leave msg.

## 2018-03-22 ENCOUNTER — Ambulatory Visit (INDEPENDENT_AMBULATORY_CARE_PROVIDER_SITE_OTHER): Payer: Medicaid Other | Admitting: Advanced Practice Midwife

## 2018-03-22 ENCOUNTER — Other Ambulatory Visit: Payer: Medicaid Other

## 2018-03-22 ENCOUNTER — Encounter: Payer: Self-pay | Admitting: Advanced Practice Midwife

## 2018-03-22 VITALS — BP 120/60 | Wt 169.0 lb

## 2018-03-22 DIAGNOSIS — O99333 Smoking (tobacco) complicating pregnancy, third trimester: Secondary | ICD-10-CM

## 2018-03-22 DIAGNOSIS — O0940 Supervision of pregnancy with grand multiparity, unspecified trimester: Secondary | ICD-10-CM

## 2018-03-22 DIAGNOSIS — O0933 Supervision of pregnancy with insufficient antenatal care, third trimester: Secondary | ICD-10-CM | POA: Diagnosis not present

## 2018-03-22 DIAGNOSIS — O0990 Supervision of high risk pregnancy, unspecified, unspecified trimester: Secondary | ICD-10-CM

## 2018-03-22 DIAGNOSIS — Z3A30 30 weeks gestation of pregnancy: Secondary | ICD-10-CM

## 2018-03-22 DIAGNOSIS — O0943 Supervision of pregnancy with grand multiparity, third trimester: Secondary | ICD-10-CM

## 2018-03-22 DIAGNOSIS — O0993 Supervision of high risk pregnancy, unspecified, third trimester: Secondary | ICD-10-CM

## 2018-03-22 DIAGNOSIS — Z3A34 34 weeks gestation of pregnancy: Secondary | ICD-10-CM

## 2018-03-22 DIAGNOSIS — F17210 Nicotine dependence, cigarettes, uncomplicated: Secondary | ICD-10-CM

## 2018-03-22 DIAGNOSIS — O0930 Supervision of pregnancy with insufficient antenatal care, unspecified trimester: Secondary | ICD-10-CM

## 2018-03-22 LAB — POCT URINALYSIS DIPSTICK OB: GLUCOSE, UA: NEGATIVE — AB

## 2018-03-22 NOTE — Patient Instructions (Signed)
Exercise During Pregnancy For people of all ages, exercise is an important part of being healthy. Exercise improves heart and lung function and helps to maintain strength, flexibility, and a healthy body weight. Exercise also boosts energy levels and elevates mood. For most women, maintaining an exercise routine throughout pregnancy is recommended. It is only on rare occasions and with certain medical conditions or pregnancy complications that women may be asked to limit or avoid exercise during pregnancy. What are some other benefits to exercising during pregnancy? Along with maintaining strength and flexibility, exercising throughout pregnancy can help to:  Keep strength in muscles that are very important during labor and childbirth.  Decrease low back pain during pregnancy.  Decrease the risk of developing gestational diabetes mellitus (GDM).  Improve blood sugar (glucose) control for women who have GDM.  Decrease the risk of developing preeclampsia. This is a serious condition that causes high blood pressure along with other symptoms, such as swelling and headaches.  Decrease the risk of cesarean delivery.  Speed up the recovery after giving birth.  How often should I exercise? Unless your health care provider gives you different instructions, you should try to exercise on most days or all days of the week. In general, try to exercise with moderate intensity for about 150 minutes per week. This can be spread out across several days, such as exercising for 30 minutes per day on 5 days of each week. You can tell that you are exercising at a moderate intensity if you have a higher heart rate and faster breathing, but you are still able to hold a conversation. What types of moderate-intensity exercise are recommended during pregnancy? There are many types of exercise that are safe for you to do during pregnancy. Unless your health care provider gives you different instructions, do a variety of  exercises that safely increase your heart and breathing (cardiopulmonary) rates and help you to build and maintain muscle strength (strength training). You should always be able to talk in full sentences while exercising during pregnancy. Some examples of exercising that is safe to do during pregnancy include:  Brisk walking or hiking.  Swimming.  Water aerobics.  Riding a stationary bike.  Strength training.  Modified yoga or Pilates. Tell your instructor that you are pregnant. Avoid overstretching and avoid lying on your back for long periods of time.  Running or jogging. Only choose this type of exercise if: ? You ran or jogged regularly before your pregnancy. ? You can run or jog and still talk in complete sentences.  What types of exercise should I not do during pregnancy? Depending on your level of fitness and whether you exercised regularly before your pregnancy, you may be advised to limit vigorous-intensity exercise during your pregnancy. You can tell that you are exercising at a vigorous intensity if you are breathing much harder and faster and cannot hold a conversation while exercising. Some examples of exercising that you should avoid during pregnancy include:  Contact sports.  Activities that place you at risk for falling on or being hit in the belly, such as downhill skiing, water skiing, surfing, rock climbing, cycling, gymnastics, and horseback riding.  Scuba diving.  Sky diving.  Yoga or Pilates in a room that is heated to extreme temperatures ("hot yoga" or "hot Pilates").  Jogging or running, unless you ran or jogged regularly before your pregnancy. While jogging or running, you should always be able to talk in full sentences. Do not run or jog so vigorously   that you are unable to have a conversation.  If you are not used to exercising at elevation (more than 6,000 feet above sea level), do not do so during your pregnancy.  When should I avoid exercising  during pregnancy? Certain medical conditions can make it unsafe to exercise during pregnancy, or they may increase your risk of miscarriage or early labor and birth. Some of these conditions include:  Some types of heart disease.  Some types of lung disease.  Placenta previa. This is when the placenta partially or completely covers the opening of the uterus (cervix).  Frequent bleeding from the vagina during your pregnancy.  Incompetent cervix. This is when your cervix does not remain as tightly closed during pregnancy as it should.  Premature labor.  Ruptured membranes. This is when the protective sac (amniotic sac) opens up and amniotic fluid leaks from your vagina.  Severely low blood count (anemia).  Preeclampsia or pregnancy-caused high blood pressure.  Carrying more than one baby (multiple gestation) and having an additional risk of early labor.  Poorly controlled diabetes.  Being severely underweight or severely overweight.  Intrauterine growth restriction. This is when your baby's growth and development during pregnancy are slower than expected.  Other medical conditions. Ask your health care provider if any apply to you.  What else should I know about exercising during pregnancy? You should take these precautions while exercising during pregnancy:  Avoid overheating. ? Wear loose-fitting, breathable clothes. ? Do not exercise in very high temperatures.  Avoid dehydration. Drink enough water before, during, and after exercise to keep your urine clear or pale yellow.  Avoid overstretching. Because of hormone changes during pregnancy, it is easy to overstretch muscles, tendons, and ligaments during pregnancy.  Start slowly and ask your health care provider to recommend types of exercise that are safe for you, if exercising regularly is new for you.  Pregnancy is not a time for exercising to lose weight. When should I seek medical care? You should stop exercising  and call your health care provider if you have any unusual symptoms, such as:  Mild uterine contractions or abdominal cramping.  Dizziness that does not improve with rest.  When should I seek immediate medical care? You should stop exercising and call your local emergency services (911 in the [place] if you have any unusual symptoms, such as:  Sudden, severe pain in your low back or your belly.  Uterine contractions or abdominal cramping that do not improve with rest.  Chest pain.  Bleeding or fluid leaking from your vagina.  Shortness of breath.  This information is not intended to replace advice given to you by your health care provider. Make sure you discuss any questions you have with your health care provider. Document Released: 07/17/2005 Document Revised: 12/15/2015 Document Reviewed: 09/24/2014 Else[employer] Interactive Patient Education  2018 [employer] Eating Plan for Pregnant Women While you are pregnant, your body will require additional nutrition to help support your growing baby. It is recommended that you consume:  150 additional calories each day during your first trimester.  300 additional calories each day during your second trimester.  300 additional calories each day during your third trimester.  Eating a healthy, well-balanced diet is very important for your health and for your baby's health. You also have a higher need for some vitamins and minerals, such as folic acid, calcium, iron, and vitamin D. What do I need to know about eating during pregnancy?  Do not try to lose weight   or go on a diet during pregnancy.  Choose healthy, nutritious foods. Choose  of a sandwich with a glass of milk instead of a candy bar or a high-calorie sugar-sweetened beverage.  Limit your overall intake of foods that have "empty calories." These are foods that have little nutritional value, such as sweets, desserts, candies, sugar-sweetened beverages, and fried foods.  Eat a  variety of foods, especially fruits and vegetables.  Take a prenatal vitamin to help meet the additional needs during pregnancy, specifically for folic acid, iron, calcium, and vitamin D.  Remember to stay active. Ask your health care provider for exercise recommendations that are specific to you.  Practice good food safety and cleanliness, such as washing your hands before you eat and after you prepare raw meat. This helps to prevent foodborne illnesses, such as listeriosis, that can be very dangerous for your baby. Ask your health care provider for more information about listeriosis. What does 150 extra calories look like? Healthy options for an additional 150 calories each day could be any of the following:  Plain low-fat yogurt (6-8 oz) with  cup of berries.  1 apple with 2 teaspoons of peanut butter.  Cut-up vegetables with  cup of hummus.  Low-fat chocolate milk (8 oz or 1 cup).  1 string cheese with 1 medium orange.   of a peanut butter and jelly sandwich on whole-wheat bread (1 tsp of peanut butter).  For 300 calories, you could eat two of those healthy options each day. What is a healthy amount of weight to gain? The recommended amount of weight for you to gain is based on your pre-pregnancy BMI. If your pre-pregnancy BMI was:  Less than 18 (underweight), you should gain 28-40 lb.  18-24.9 (normal), you should gain 25-35 lb.  25-29.9 (overweight), you should gain 15-25 lb.  Greater than 30 (obese), you should gain 11-20 lb.  What if I am having twins or multiples? Generally, pregnant women who will be having twins or multiples may need to increase their daily calories by 300-600 calories each day. The recommended range for total weight gain is 25-54 lb, depending on your pre-pregnancy BMI. Talk with your health care provider for specific guidance about additional nutritional needs, weight gain, and exercise during your pregnancy. What foods can I eat? Grains Any  grains. Try to choose whole grains, such as whole-wheat bread, oatmeal, or brown rice. Vegetables Any vegetables. Try to eat a variety of colors and types of vegetables to get a full range of vitamins and minerals. Remember to wash your vegetables well before eating. Fruits Any fruits. Try to eat a variety of colors and types of fruit to get a full range of vitamins and minerals. Remember to wash your fruits well before eating. Meats and Other Protein Sources Lean meats, including chicken, turkey, fish, and lean cuts of beef, veal, or pork. Make sure that all meats are cooked to "well done." Tofu. Tempeh. Beans. Eggs. Peanut butter and other nut butters. Seafood, such as shrimp, crab, and lobster. If you choose fish, select types that are higher in omega-3 fatty acids, including salmon, herring, mussels, trout, sardines, and pollock. Make sure that all meats are cooked to food-safe temperatures. Dairy Pasteurized milk and milk alternatives. Pasteurized yogurt and pasteurized cheese. Cottage cheese. Sour cream. Beverages Water. Juices that contain 100% fruit juice or vegetable juice. Caffeine-free teas and decaffeinated coffee. Drinks that contain caffeine are okay to drink, but it is better to avoid caffeine. Keep your total caffeine   intake to less than 200 mg each day (12 oz of coffee, tea, or soda) or as directed by your health care provider. Condiments Any pasteurized condiments. Sweets and Desserts Any sweets and desserts. Fats and Oils Any fats and oils. The items listed above may not be a complete list of recommended foods or beverages. Contact your dietitian for more options. What foods are not recommended? Vegetables Unpasteurized (raw) vegetable juices. Fruits Unpasteurized (raw) fruit juices. Meats and Other Protein Sources Cured meats that have nitrates, such as bacon, salami, and hotdogs. Luncheon meats, bologna, or other deli meats (unless they are reheated until they are  steaming hot). Refrigerated pate, meat spreads from a meat counter, smoked seafood that is found in the refrigerated section of a store. Raw fish, such as sushi or sashimi. High mercury content fish, such as tilefish, shark, swordfish, and king mackerel. Raw meats, such as tuna or beef tartare. Undercooked meats and poultry. Make sure that all meats are cooked to food-safe temperatures. Dairy Unpasteurized (raw) milk and any foods that have raw milk in them. Soft cheeses, such as feta, queso blanco, queso fresco, Brie, Camembert cheeses, blue-veined cheeses, and Panela cheese (unless it is made with pasteurized milk, which must be stated on the label). Beverages Alcohol. Sugar-sweetened beverages, such as sodas, teas, or energy drinks. Condiments Homemade fermented foods and drinks, such as pickles, sauerkraut, or kombucha drinks. (Store-bought pasteurized versions of these are okay.) Other Salads that are made in the store, such as ham salad, chicken salad, egg salad, tuna salad, and seafood salad. The items listed above may not be a complete list of foods and beverages to avoid. Contact your dietitian for more information. This information is not intended to replace advice given to you by your health care provider. Make sure you discuss any questions you have with your health care provider. Document Released: 05/01/2014 Document Revised: 12/23/2015 Document Reviewed: 12/30/2013 [employer] Interactive Patient Education  2018 [employer] Prenatal Care WHAT IS PRENATAL CARE? Prenatal care is the process of caring for a pregnant woman before she gives birth. Prenatal care makes sure that she and her baby remain as healthy as possible throughout pregnancy. Prenatal care may be provided by a midwife, family practice health care provider, or a childbirth and pregnancy specialist (obstetrician). Prenatal care may include physical examinations, testing, treatments, and education on nutrition, lifestyle, and  social support services. WHY IS PRENATAL CARE SO IMPORTANT? Early and consistent prenatal care increases the chance that you and your baby will remain healthy throughout your pregnancy. This type of care also decreases a baby's risk of being born too early (prematurely), or being born smaller than expected (small for gestational age). Any underlying medical conditions you may have that could pose a risk during your pregnancy are discussed during prenatal care visits. You will also be monitored regularly for any new conditions that may arise during your pregnancy so they can be treated quickly and effectively. WHAT HAPPENS DURING PRENATAL CARE VISITS? Prenatal care visits may include the following: Discussion Tell your health care provider about any new signs or symptoms you have experienced since your last visit. These might include:  Nausea or vomiting.  Increased or decreased level of energy.  Difficulty sleeping.  Back or leg pain.  Weight changes.  Frequent urination.  Shortness of breath with physical activity.  Changes in your skin, such as the development of a rash or itchiness.  Vaginal discharge or bleeding.  Feelings of excitement or nervousness.  Changes in   your baby's movements.  You may want to write down any questions or topics you want to discuss with your health care provider and bring them with you to your appointment. Examination During your first prenatal care visit, you will likely have a complete physical exam. Your health care provider will often examine your vagina, cervix, and the position of your uterus, as well as check your heart, lungs, and other body systems. As your pregnancy progresses, your health care provider will measure the size of your uterus and your baby's position inside your uterus. He or she may also examine you for early signs of labor. Your prenatal visits may also include checking your blood pressure and, after about 10-12 weeks of  pregnancy, listening to your baby's heartbeat. Testing Regular testing often includes:  Urinalysis. This checks your urine for glucose, protein, or signs of infection.  Blood count. This checks the levels of white and red blood cells in your body.  Tests for sexually transmitted infections (STIs). Testing for STIs at the beginning of pregnancy is routinely done and is required in many states.  Antibody testing. You will be checked to see if you are immune to certain illnesses, such as rubella, that can affect a developing fetus.  Glucose screen. Around 24-28 weeks of pregnancy, your blood glucose level will be checked for signs of gestational diabetes. Follow-up tests may be recommended.  Group B strep. This is a bacteria that is commonly found inside a woman's vagina. This test will inform your health care provider if you need an antibiotic to reduce the amount of this bacteria in your body prior to labor and childbirth.  Ultrasound. Many pregnant women undergo an ultrasound screening around 18-20 weeks of pregnancy to evaluate the health of the fetus and check for any developmental abnormalities.  HIV (human immunodeficiency virus) testing. Early in your pregnancy, you will be screened for HIV. If you are at high risk for HIV, this test may be repeated during your third trimester of pregnancy.  You may be offered other testing based on your age, personal or family medical history, or other factors. HOW OFTEN SHOULD I PLAN TO SEE MY HEALTH CARE PROVIDER FOR PRENATAL CARE? Your prenatal care check-up schedule depends on any medical conditions you have before, or develop during, your pregnancy. If you do not have any underlying medical conditions, you will likely be seen for checkups:  Monthly, during the first 6 months of pregnancy.  Twice a month during months 7 and 8 of pregnancy.  Weekly starting in the 9th month of pregnancy and until delivery.  If you develop signs of early labor  or other concerning signs or symptoms, you may need to see your health care provider more often. Ask your health care provider what prenatal care schedule is best for you. WHAT CAN I DO TO KEEP MYSELF AND MY BABY AS HEALTHY AS POSSIBLE DURING MY PREGNANCY?  Take a prenatal vitamin containing 400 micrograms (0.4 mg) of folic acid every day. Your health care provider may also ask you to take additional vitamins such as iodine, vitamin D, iron, copper, and zinc.  Take 1500-2000 mg of calcium daily starting at your 20th week of pregnancy until you deliver your baby.  Make sure you are up to date on your vaccinations. Unless directed otherwise by your health care provider: ? You should receive a tetanus, diphtheria, and pertussis (Tdap) vaccination between the 27th and 36th week of your pregnancy, regardless of when your last Tdap immunization   occurred. This helps protect your baby from whooping cough (pertussis) after he or she is born. ? You should receive an annual inactivated influenza vaccine (IIV) to help protect you and your baby from influenza. This can be done at any point during your pregnancy.  Eat a well-rounded diet that includes: ? Fresh fruits and vegetables. ? Lean proteins. ? Calcium-rich foods such as milk, yogurt, hard cheeses, and dark, leafy greens. ? Whole grain breads.  Do noteat seafood high in mercury, including: ? Swordfish. ? Tilefish. ? Shark. ? King mackerel. ? More than 6 oz tuna per week.  Do not eat: ? Raw or undercooked meats or eggs. ? Unpasteurized foods, such as soft cheeses (brie, blue, or feta), juices, and milks. ? Lunch meats. ? Hot dogs that have not been heated until they are steaming.  Drink enough water to keep your urine clear or pale yellow. For many women, this may be 10 or more 8 oz glasses of water each day. Keeping yourself hydrated helps deliver nutrients to your baby and may prevent the start of pre-term uterine contractions.  Do not use  any tobacco products including cigarettes, chewing tobacco, or electronic cigarettes. If you need help quitting, ask your health care provider.  Do not drink beverages containing alcohol. No safe level of alcohol consumption during pregnancy has been determined.  Do not use any illegal drugs. These can harm your developing baby or cause a miscarriage.  Ask your health care provider or pharmacist before taking any prescription or over-the-counter medicines, herbs, or supplements.  Limit your caffeine intake to no more than 200 mg per day.  Exercise. Unless told otherwise by your health care provider, try to get 30 minutes of moderate exercise most days of the week. Do not  do high-impact activities, contact sports, or activities with a high risk of falling, such as horseback riding or downhill skiing.  Get plenty of rest.  Avoid anything that raises your body temperature, such as hot tubs and saunas.  If you own a cat, do not empty its litter box. Bacteria contained in cat feces can cause an infection called toxoplasmosis. This can result in serious harm to the fetus.  Stay away from chemicals such as insecticides, lead, mercury, and cleaning or paint products that contain solvents.  Do not have any X-rays taken unless medically necessary.  Take a childbirth and breastfeeding preparation class. Ask your health care provider if you need a referral or recommendation.  This information is not intended to replace advice given to you by your health care provider. Make sure you discuss any questions you have with your health care provider. Document Released: 07/20/2003 Document Revised: 12/20/2015 Document Reviewed: 10/01/2013 [employer] Interactive Patient Education  2017 [employer]  

## 2018-03-22 NOTE — Progress Notes (Signed)
1 hr GTT Pt feels pressure on her belly button since last Saturday.

## 2018-03-23 LAB — GLUCOSE, 1 HOUR GESTATIONAL: Gestational Diabetes Screen: 112 mg/dL (ref 65–139)

## 2018-03-24 ENCOUNTER — Encounter: Payer: Self-pay | Admitting: Advanced Practice Midwife

## 2018-03-24 NOTE — Progress Notes (Signed)
New Obstetric Patient H&P    Chief Complaint: "Desires prenatal care"   History of Present Illness: Patient is a 32 y.o. [id number] Not Hispanic or Latino female, presents with amenorrhea and positive home pregnancy test. Patient's last menstrual period was (lmp unknown). and based on ultrasound on 8/9, her EDD is Estimated Date of Delivery: 06/01/18 and her EGA is [redacted]w[redacted]d. Cycles are 4 days, of spotting, and occur approximately every 30 days. Her last pap smear was 6 years ago and was no abnormalities.    She had a urine pregnancy test which was positive at the end of December 2018 which would put her at approximately 34 weeks. This could have been a false positive. She is unsure when her LMP was. Since her LMP she claims she has experienced fatigue. She denies vaginal bleeding. Her past medical history is noncontributory. Her prior pregnancies are notable for full term SVDs. Her last baby delivered 3 or 4 weeks early per her report.  Since her LMP, she admits to the use of tobacco products  Yes 3-4 cigarettes per day She claims she has gained  33 pounds since the start of her pregnancy.  There are cats in the home in the home  yes If yes Indoor She admits close contact with children on a regular basis  yes  She has had chicken pox in the past no She has had Tuberculosis exposures, symptoms, or previously tested positive for TB   no Current or past history of domestic violence. no  Genetic Screening/Teratology Counseling: (Includes patient, baby's father, or anyone in either family with:)   1. Patient's age >/= 48 at [facility]  no 2. Thalassemia ([place], [place], Mediterranean, or Asian background): MCV<80  no 3. Neural tube defect (meningomyelocele, spina bifida, anencephaly)  no 4. Congenital heart defect  no  5. Down syndrome  no 6. Tay-Sachs (Jewish, [place]  no 7. Canavan's Disease  no 8. Sickle cell disease or trait (African)  no  9. Hemophilia or other blood disorders  no  10.  Muscular dystrophy  no  11. Cystic fibrosis  no  12. Huntington's Chorea  no  13. Mental retardation/autism  no 14. Other inherited genetic or chromosomal disorder  no 15. Maternal metabolic disorder (DM, PKU, etc)  no 16. Patient or FOB with a child with a birth defect not listed above no  16a. Patient or FOB with a birth defect themselves no 17. Recurrent pregnancy loss, or stillbirth  no  18. Any medications since LMP other than prenatal vitamins (include vitamins, supplements, OTC meds, drugs, alcohol)  no 19. Any other genetic/environmental exposure to discuss  no  Infection History:   1. Lives with someone with TB or TB exposed  no  2. Patient or partner has history of genital herpes  no 3. Rash or viral illness since LMP  no 4. History of STI (GC, CT, HPV, syphilis, HIV)  no 5. History of recent travel :  no  Other pertinent information:  no     Review of Systems:10 point review of systems negative unless otherwise noted in HPI  Past Medical History:  Past Medical History:  Diagnosis Date  . Abscess and cellulitis   . Asthma   . Headache   . Heart murmur   . Tobacco use disorder   . UTI (lower urinary tract infection)     Past Surgical History:  Past Surgical History:  Procedure Laterality Date  . NO PAST SURGERIES  Gynecologic History: Patient's last menstrual period was 07/26/2017 (lmp unknown).  Obstetric History: [id number]  Family History:  Family History  Problem Relation Age of Onset  . Cancer Maternal Aunt 19       breast  . Cancer Paternal Aunt 11       breast  . Diabetes Paternal Grandmother   . Heart disease Paternal Grandmother     Social History:  Social History   Socioeconomic History  . Marital status: Married    Spouse name: Richard  . Number of children: 6  . Years of education: Not on file  . Highest education level: Not on file  Occupational History  . Occupation: unemployed  Social Needs  . Financial resource strain:  Not on file  . Food insecurity:    Worry: Not on file    Inability: Not on file  . Transportation needs:    Medical: Yes    Non-medical: Yes  Tobacco Use  . Smoking status: Current Every Day Smoker    Packs/day: 0.25    Years: 9.00    Pack years: 2.25    Types: Cigarettes  . Smokeless tobacco: Never Used  Substance and Sexual Activity  . Alcohol use: No    Alcohol/week: 1.0 standard drinks    Types: 1 Cans of beer per week  . Drug use: No  . Sexual activity: Yes    Birth control/protection: Surgical  Lifestyle  . Physical activity:    Days per week: Not on file    Minutes per session: Not on file  . Stress: Not on file  Relationships  . Social connections:    Talks on phone: Not on file    Gets together: Not on file    Attends religious service: Not on file    Active member of club or organization: Not on file    Attends meetings of clubs or organizations: Not on file    Relationship status: Not on file  . Intimate partner violence:    Fear of current or ex partner: Not on file    Emotionally abused: Not on file    Physically abused: Not on file    Forced sexual activity: Not on file  Other Topics Concern  . Not on file  Social History Narrative   Joint custody of 2 daughters with ex-husband   Step-daughter in home with 4 other children    Allergies:  Allergies  Allergen Reactions  . Excedrin Extra Strength [Aspirin-Acetaminophen-Caffeine] Swelling    Throat swelling  . Guaifenesin & Derivatives Swelling  . Ibuprofen Swelling and Other (See Comments)    Causes blisters    Medications: Prior to Admission medications   Medication Sig Start Date End Date Taking? Authorizing Provider  albuterol (PROVENTIL HFA;VENTOLIN HFA) 108 (90 BASE) MCG/ACT inhaler Inhale 2 puffs into the lungs every 6 (six) hours as needed for wheezing or shortness of breath. 07/19/15  Yes Rebecka Apley, MD  amoxicillin-clavulanate (AUGMENTIN) 875-125 MG tablet Take 1 tablet by mouth  2 (two) times daily for 14 days. 03/17/18 03/31/18 Yes Oswaldo Conroy, CNM  butalbital-acetaminophen-caffeine [place], ESGIC) [phone] MG tablet Take 1-2 tablets by mouth every 6 (six) hours as needed for headache. 03/17/18 03/17/19 Yes Oswaldo Conroy, CNM  hyoscyamine (LEVSIN, ANASPAZ) 0.125 MG tablet Take 1 tablet (0.125 mg total) by mouth every 4 (four) hours as needed. 03/17/18  Yes Oswaldo Conroy, CNM  nitrofurantoin, macrocrystal-monohydrate, (MACROBID) 100 MG capsule Take 1 capsule (100 mg total) by mouth 2 (  two) times daily. 03/19/18  Yes Schuman, Jaquelyn Bitterhristanna R, MD  Prenatal Vit-Fe Fumarate-FA (PRENATAL MULTIVITAMIN) TABS Take 1 tablet by mouth at bedtime. Reported on 10/09/2015   Yes [provider]    Physical Exam Vitals: Blood pressure 120/60, weight 169 lb (76.7 kg), last menstrual period 07/26/2017  General: NAD HEENT: normocephalic, anicteric Thyroid: no enlargement, no palpable nodules Pulmonary: No increased work of breathing, CTAB Cardiovascular: RRR, distal pulses 2+ Abdomen: NABS, soft, non-tender, non-distended.  Umbilicus without lesions.  No hepatomegaly, splenomegaly or masses palpable. No evidence of hernia  Genitourinary:  External: Normal external female genitalia.  Normal urethral meatus, normal  Bartholin's and Skene's glands.    Vagina: Normal vaginal mucosa, no evidence of prolapse.    Cervix: Grossly normal in appearance, no bleeding, no CMT  Uterus: Enlarged, mobile, normal contour.    Adnexa: ovaries non-enlarged, no adnexal masses  Rectal: deferred Extremities: no edema, erythema, or tenderness Neurologic: Grossly intact Psychiatric: mood appropriate, affect full   Assessment: 32 y.o. G7P6006 at [id number] presenting to initiate prenatal care  Plan: 1) Avoid alcoholic beverages. 2) Patient encouraged not to smoke.  3) Discontinue the use of all non-medicinal drugs and chemicals.  4) Take prenatal vitamins daily.  5) Nutrition, food safety  (fish, cheese advisories, and high nitrite foods) and exercise discussed. 6) Hospital and practice style discussed with cross coverage system.  7) Genetic Screening, such as with 1st Trimester Screening, cell free fetal DNA, AFP testing, and Ultrasound, as well as with amniocentesis and CVS as appropriate, is discussed with patient. At the conclusion of today's visit patient declined genetic testing 8) Patient is asked about travel to areas at risk for the [place] virus, and counseled to avoid travel and exposure to mosquitoes or sexual partners who may have themselves been exposed to the virus. Testing is discussed, and will be ordered as appropriate.  9) Growth scan to compare with 8/9 u/s and rob in 2 weeks   Tresea MallJane Tenelle Andreason, CNM [facility], [facility] 03/24/2018, 11:42 AM

## 2018-04-05 ENCOUNTER — Ambulatory Visit (INDEPENDENT_AMBULATORY_CARE_PROVIDER_SITE_OTHER): Payer: Medicaid Other | Admitting: Advanced Practice Midwife

## 2018-04-05 ENCOUNTER — Encounter: Payer: Self-pay | Admitting: Advanced Practice Midwife

## 2018-04-05 ENCOUNTER — Ambulatory Visit (INDEPENDENT_AMBULATORY_CARE_PROVIDER_SITE_OTHER): Payer: Medicaid Other

## 2018-04-05 VITALS — BP 110/68 | Wt 168.0 lb

## 2018-04-05 DIAGNOSIS — Z3A31 31 weeks gestation of pregnancy: Secondary | ICD-10-CM

## 2018-04-05 DIAGNOSIS — O0990 Supervision of high risk pregnancy, unspecified, unspecified trimester: Secondary | ICD-10-CM

## 2018-04-05 DIAGNOSIS — Z6791 Unspecified blood type, Rh negative: Secondary | ICD-10-CM

## 2018-04-05 DIAGNOSIS — O360130 Maternal care for anti-D [Rh] antibodies, third trimester, not applicable or unspecified: Secondary | ICD-10-CM

## 2018-04-05 DIAGNOSIS — Z3A30 30 weeks gestation of pregnancy: Secondary | ICD-10-CM

## 2018-04-05 DIAGNOSIS — O0943 Supervision of pregnancy with grand multiparity, third trimester: Secondary | ICD-10-CM

## 2018-04-05 DIAGNOSIS — O26893 Other specified pregnancy related conditions, third trimester: Secondary | ICD-10-CM

## 2018-04-05 LAB — POCT URINALYSIS DIPSTICK OB
GLUCOSE, UA: NEGATIVE
PROTEIN: NEGATIVE

## 2018-04-05 MED ORDER — RHO D IMMUNE GLOBULIN 1500 UNIT/2ML IJ SOSY
300.0000 ug | PREFILLED_SYRINGE | Freq: Once | INTRAMUSCULAR | Status: AC
  Administered 2018-04-05: 300 ug via INTRAMUSCULAR

## 2018-04-05 NOTE — Progress Notes (Signed)
ROB Rhogam given  BTL form signed TDAP at next visit

## 2018-04-05 NOTE — Patient Instructions (Signed)
Third Trimester of Pregnancy The third trimester is from week 28 through week 40 (months 7 through 9). The third trimester is a time when the unborn baby (fetus) is growing rapidly. At the end of the ninth month, the fetus is about 20 inches in length and weighs 6-10 pounds. Body changes during your third trimester Your body will continue to go through many changes during pregnancy. The changes vary from woman to woman. During the third trimester:  Your weight will continue to increase. You can expect to gain 25-35 pounds (11-16 kg) by the end of the pregnancy.  You may begin to get stretch marks on your hips, abdomen, and breasts.  You may urinate more often because the fetus is moving lower into your pelvis and pressing on your bladder.  You may develop or continue to have heartburn. This is caused by increased hormones that slow down muscles in the digestive tract.  You may develop or continue to have constipation because increased hormones slow digestion and cause the muscles that push waste through your intestines to relax.  You may develop hemorrhoids. These are swollen veins (varicose veins) in the rectum that can itch or be painful.  You may develop swollen, bulging veins (varicose veins) in your legs.  You may have increased body aches in the pelvis, back, or thighs. This is due to weight gain and increased hormones that are relaxing your joints.  You may have changes in your hair. These can include thickening of your hair, rapid growth, and changes in texture. Some women also have hair loss during or after pregnancy, or hair that feels dry or thin. Your hair will most likely return to normal after your baby is born.  Your breasts will continue to grow and they will continue to become tender. A yellow fluid (colostrum) may leak from your breasts. This is the first milk you are producing for your baby.  Your belly button may stick out.  You may notice more swelling in your hands,  face, or ankles.  You may have increased tingling or numbness in your hands, arms, and legs. The skin on your belly may also feel numb.  You may feel short of breath because of your expanding uterus.  You may have more problems sleeping. This can be caused by the size of your belly, increased need to urinate, and an increase in your body's metabolism.  You may notice the fetus "dropping," or moving lower in your abdomen (lightening).  You may have increased vaginal discharge.  You may notice your joints feel loose and you may have pain around your pelvic bone.  What to expect at prenatal visits You will have prenatal exams every 2 weeks until week 36. Then you will have weekly prenatal exams. During a routine prenatal visit:  You will be weighed to make sure you and the baby are growing normally.  Your blood pressure will be taken.  Your abdomen will be measured to track your baby's growth.  The fetal heartbeat will be listened to.  Any test results from the previous visit will be discussed.  You may have a cervical check near your due date to see if your cervix has softened or thinned (effaced).  You will be tested for Group B streptococcus. This happens between 35 and 37 weeks.  Your health care provider may ask you:  What your birth plan is.  How you are feeling.  If you are feeling the baby move.  If you have had   any abnormal symptoms, such as leaking fluid, bleeding, severe headaches, or abdominal cramping.  If you are using any tobacco products, including cigarettes, chewing tobacco, and electronic cigarettes.  If you have any questions.  Other tests or screenings that may be performed during your third trimester include:  Blood tests that check for low iron levels (anemia).  Fetal testing to check the health, activity level, and growth of the fetus. Testing is done if you have certain medical conditions or if there are problems during the  pregnancy.  Nonstress test (NST). This test checks the health of your baby to make sure there are no signs of problems, such as the baby not getting enough oxygen. During this test, a belt is placed around your belly. The baby is made to move, and its heart rate is monitored during movement.  What is false labor? False labor is a condition in which you feel small, irregular tightenings of the muscles in the womb (contractions) that usually go away with rest, changing position, or drinking water. These are called Braxton Hicks contractions. Contractions may last for hours, days, or even weeks before true labor sets in. If contractions come at regular intervals, become more frequent, increase in intensity, or become painful, you should see your health care provider. What are the signs of labor?  Abdominal cramps.  Regular contractions that start at 10 minutes apart and become stronger and more frequent with time.  Contractions that start on the top of the uterus and spread down to the lower abdomen and back.  Increased pelvic pressure and dull back pain.  A watery or bloody mucus discharge that comes from the vagina.  Leaking of amniotic fluid. This is also known as your "water breaking." It could be a slow trickle or a gush. Let your health care provider know if it has a color or strange odor. If you have any of these signs, call your health care provider right away, even if it is before your due date. Follow these instructions at home: Medicines  Follow your health care provider's instructions regarding medicine use. Specific medicines may be either safe or unsafe to take during pregnancy.  Take a prenatal vitamin that contains at least 600 micrograms (mcg) of folic acid.  If you develop constipation, try taking a stool softener if your health care provider approves. Eating and drinking  Eat a balanced diet that includes fresh fruits and vegetables, whole grains, good sources of protein  such as meat, eggs, or tofu, and low-fat dairy. Your health care provider will help you determine the amount of weight gain that is right for you.  Avoid raw meat and uncooked cheese. These carry germs that can cause birth defects in the baby.  If you have low calcium intake from food, talk to your health care provider about whether you should take a daily calcium supplement.  Eat four or five small meals rather than three large meals a day.  Limit foods that are high in fat and processed sugars, such as fried and sweet foods.  To prevent constipation: ? Drink enough fluid to keep your urine clear or pale yellow. ? Eat foods that are high in fiber, such as fresh fruits and vegetables, whole grains, and beans. Activity  Exercise only as directed by your health care provider. Most women can continue their usual exercise routine during pregnancy. Try to exercise for 30 minutes at least 5 days a week. Stop exercising if you experience uterine contractions.  Avoid heavy   lifting.  Do not exercise in extreme heat or humidity, or at high altitudes.  Wear low-heel, comfortable shoes.  Practice good posture.  You may continue to have sex unless your health care provider tells you otherwise. Relieving pain and discomfort  Take frequent breaks and rest with your legs elevated if you have leg cramps or low back pain.  Take warm sitz baths to soothe any pain or discomfort caused by hemorrhoids. Use hemorrhoid cream if your health care provider approves.  Wear a good support bra to prevent discomfort from breast tenderness.  If you develop varicose veins: ? Wear support pantyhose or compression stockings as told by your healthcare provider. ? Elevate your feet for 15 minutes, 3-4 times a day. Prenatal care  Write down your questions. Take them to your prenatal visits.  Keep all your prenatal visits as told by your health care provider. This is important. Safety  Wear your seat belt at  all times when driving.  Make a list of emergency phone numbers, including numbers for family, friends, the hospital, and police and fire departments. General instructions  Avoid cat litter boxes and soil used by cats. These carry germs that can cause birth defects in the baby. If you have a cat, ask someone to clean the litter box for you.  Do not travel far distances unless it is absolutely necessary and only with the approval of your health care provider.  Do not use hot tubs, steam rooms, or saunas.  Do not drink alcohol.  Do not use any products that contain nicotine or tobacco, such as cigarettes and e-cigarettes. If you need help quitting, ask your health care provider.  Do not use any medicinal herbs or unprescribed drugs. These chemicals affect the formation and growth of the baby.  Do not douche or use tampons or scented sanitary pads.  Do not cross your legs for long periods of time.  To prepare for the arrival of your baby: ? Take prenatal classes to understand, practice, and ask questions about labor and delivery. ? Make a trial run to the hospital. ? Visit the hospital and tour the maternity area. ? Arrange for maternity or paternity leave through employers. ? Arrange for family and friends to take care of pets while you are in the hospital. ? Purchase a rear-facing car seat and make sure you know how to install it in your car. ? Pack your hospital bag. ? Prepare the baby's nursery. Make sure to remove all pillows and stuffed animals from the baby's crib to prevent suffocation.  Visit your dentist if you have not gone during your pregnancy. Use a soft toothbrush to brush your teeth and be gentle when you floss. Contact a health care provider if:  You are unsure if you are in labor or if your water has broken.  You become dizzy.  You have mild pelvic cramps, pelvic pressure, or nagging pain in your abdominal area.  You have lower back pain.  You have persistent  nausea, vomiting, or diarrhea.  You have an unusual or bad smelling vaginal discharge.  You have pain when you urinate. Get help right away if:  Your water breaks before 37 weeks.  You have regular contractions less than 5 minutes apart before 37 weeks.  You have a fever.  You are leaking fluid from your vagina.  You have spotting or bleeding from your vagina.  You have severe abdominal pain or cramping.  You have rapid weight loss or weight gain.    You have shortness of breath with chest pain.  You notice sudden or extreme swelling of your face, hands, ankles, feet, or legs.  Your baby makes fewer than 10 movements in 2 hours.  You have severe headaches that do not go away when you take medicine.  You have vision changes. Summary  The third trimester is from week 28 through week 40, months 7 through 9. The third trimester is a time when the unborn baby (fetus) is growing rapidly.  During the third trimester, your discomfort may increase as you and your baby continue to gain weight. You may have abdominal, leg, and back pain, sleeping problems, and an increased need to urinate.  During the third trimester your breasts will keep growing and they will continue to become tender. A yellow fluid (colostrum) may leak from your breasts. This is the first milk you are producing for your baby.  False labor is a condition in which you feel small, irregular tightenings of the muscles in the womb (contractions) that eventually go away. These are called Braxton Hicks contractions. Contractions may last for hours, days, or even weeks before true labor sets in.  Signs of labor can include: abdominal cramps; regular contractions that start at 10 minutes apart and become stronger and more frequent with time; watery or bloody mucus discharge that comes from the vagina; increased pelvic pressure and dull back pain; and leaking of amniotic fluid. This information is not intended to replace advice  given to you by your health care provider. Make sure you discuss any questions you have with your health care provider. Document Released: 07/11/2001 Document Revised: 12/23/2015 Document Reviewed: 09/17/2012 Elsevier Interactive Patient Education  2017 [employer]  

## 2018-04-05 NOTE — Progress Notes (Signed)
Routine Prenatal Care Visit  Subjective  Eileen Tate is a 32 y.o. G7P6006 at [redacted]w[redacted]d being seen today for ongoing prenatal care.  She is currently monitored for the following issues for this high-risk pregnancy and has LSIL (low grade squamous intraepithelial lesion) on Pap smear; History of asthma; Abdominal pain affecting pregnancy; Supervision of high risk pregnancy, antepartum, third trimester; and Urinary tract infection on their problem list.  ----------------------------------------------------------------------------------- Patient reports pain in her abdomen around her belly button, vaginal pain and pressure, 3 contractions yesterday, diarrhea since yesterday. Encouraged increase hydration to help decrease contractions and because of diarrhea. Reassurance given that cervix remains closed. She worries that she will have a precipitous labor.   Contractions: Irregular. Vag. Bleeding: None.  Movement: Present. Denies leaking of fluid.  ----------------------------------------------------------------------------------- The following portions of the patient's history were reviewed and updated as appropriate: allergies, current medications, past family history, past medical history, past social history, past surgical history and problem list. Problem list updated.   Objective  Blood pressure 110/68, weight 168 lb (76.2 kg), unknown if currently breastfeeding. Pregravid weight 136 lb (61.7 kg) Total Weight Gain 32 lb (14.5 kg) Urinalysis: Urine Protein Negative  Urine Glucose Negative  Fetal Status: Fetal Heart Rate (bpm): 130 Fundal Height: 33 cm Movement: Present  Presentation: Complete Breech  Growth scan today agrees with 27 wk dating scan within 1 week.  27.7%, 3 pounds 8 ounces (all of her children have been in 5-6 pound range at birth), AFI 20.27  General:  Alert, oriented and cooperative. Patient is in no acute distress.  Skin: Skin is warm and dry. No rash noted.     Cardiovascular: Normal heart rate noted  Respiratory: Normal respiratory effort, no problems with respiration noted  Abdomen: Soft, gravid, appropriate for gestational age. Pain/Pressure: Present     Pelvic:  Cervical exam performed Dilation: Closed Effacement (%): Thick Station: -3, external os is a fingertip  Extremities: Normal range of motion.  Edema: None  Mental Status: Normal mood and affect. Normal behavior. Normal judgment and thought content.   Assessment   32 y.o. G7P6006 at [redacted]w[redacted]d by  06/01/2018, by Ultrasound presenting for routine prenatal visit  Plan   7th pregnancy Problems (from 03/08/18 to present)    Problem Noted Resolved   Supervision of high risk pregnancy, antepartum, third trimester 03/12/2018 by Oswaldo Conroy, CNM No   Overview Signed 03/12/2018  1:57 PM by Oswaldo Conroy, CNM    Clinic [facility] Prenatal Labs  Dating  Blood type: --/--/A NEG (08/09 1748)   Genetic Screen 1 Screen:    AFP:     Quad:     NIPS: Antibody:NEG (08/09 1748)  Anatomic [place]  Rubella: 30.80 (08/09 1748) Varicella: 373 (08/09 1748)  GTT Early:               Third trimester:  RPR: Non Reactive (08/09 1748)   Rhogam  HBsAg: Negative (08/09 1748)   TDaP vaccine                       Flu Shot: HIV: Non Reactive (08/09 1748)   Baby Food                                ZOX:WRUEAVWU (09/26 0000)  Contraception  Pap:  CBB     CS/VBAC    Support Person  Preterm labor symptoms and general obstetric precautions including but not limited to vaginal bleeding, contractions, leaking of fluid and fetal movement were reviewed in detail with the patient. Please refer to After Visit Summary for other counseling recommendations.   Return in about 2 weeks (around 04/19/2018) for rob.  Tresea Mall, CNM 04/05/2018 11:01 AM

## 2018-04-15 DIAGNOSIS — O0930 Supervision of pregnancy with insufficient antenatal care, unspecified trimester: Secondary | ICD-10-CM | POA: Insufficient documentation

## 2018-04-15 DIAGNOSIS — O0940 Supervision of pregnancy with grand multiparity, unspecified trimester: Secondary | ICD-10-CM | POA: Insufficient documentation

## 2018-04-16 ENCOUNTER — Ambulatory Visit (INDEPENDENT_AMBULATORY_CARE_PROVIDER_SITE_OTHER): Payer: Medicaid Other | Admitting: Obstetrics and Gynecology

## 2018-04-16 ENCOUNTER — Encounter: Payer: Self-pay | Admitting: Obstetrics and Gynecology

## 2018-04-16 VITALS — BP 100/62 | Wt 168.5 lb

## 2018-04-16 DIAGNOSIS — O0930 Supervision of pregnancy with insufficient antenatal care, unspecified trimester: Secondary | ICD-10-CM

## 2018-04-16 DIAGNOSIS — O0993 Supervision of high risk pregnancy, unspecified, third trimester: Secondary | ICD-10-CM

## 2018-04-16 DIAGNOSIS — Z3A33 33 weeks gestation of pregnancy: Secondary | ICD-10-CM

## 2018-04-16 DIAGNOSIS — O0940 Supervision of pregnancy with grand multiparity, unspecified trimester: Secondary | ICD-10-CM

## 2018-04-16 LAB — POCT URINALYSIS DIPSTICK OB
GLUCOSE, UA: NEGATIVE
POC,PROTEIN,UA: NEGATIVE

## 2018-04-16 NOTE — Progress Notes (Signed)
ROB C/o dizzy spells here recently, cough and congestion going on.  Declines flu shot

## 2018-04-16 NOTE — Progress Notes (Signed)
Routine Prenatal Care Visit  Subjective  Eileen Tate is a 32 y.o. G7P6006 at 7455w3d being seen today for ongoing prenatal care.  She is currently monitored for the following issues for this high-risk pregnancy and has LSIL (low grade squamous intraepithelial lesion) on Pap smear; History of asthma; Abdominal pain affecting pregnancy; Supervision of high risk pregnancy, antepartum, third trimester; Urinary tract infection; Late prenatal care affecting pregnancy, antepartum; and Grand multiparity with current pregnancy, antepartum on their problem list.  ----------------------------------------------------------------------------------- Patient reports backache and pain around her belly button and lower abdomen, increased pressure. .    Lockie Pares. Vag. Bleeding: None.  Movement: Present. Denies leaking of fluid.  ----------------------------------------------------------------------------------- The following portions of the patient's history were reviewed and updated as appropriate: allergies, current medications, past family history, past medical history, past social history, past surgical history and problem list. Problem list updated.   Objective  Blood pressure 100/62, weight 168 lb 8 oz (76.4 kg), unknown if currently breastfeeding. Pregravid weight 136 lb (61.7 kg) Total Weight Gain 32 lb 8 oz (14.7 kg) Urinalysis:      Fetal Status: Fetal Heart Rate (bpm): 130 Fundal Height: 33 cm Movement: Present     General:  Alert, oriented and cooperative. Patient is in no acute distress.  Skin: Skin is warm and dry. No rash noted.   Cardiovascular: Normal heart rate noted  Respiratory: Normal respiratory effort, no problems with respiration noted  Abdomen: Soft, gravid, appropriate for gestational age. Pain/Pressure: Present     Pelvic:  Cervical exam deferred        Extremities: Normal range of motion.  Edema: None  ental Status: Normal mood and affect. Normal behavior. Normal judgment and  thought content.     Assessment   32 y.o. G7P6006 at 2755w3d by  06/01/2018, by Ultrasound presenting for routine prenatal visit  Plan   7th pregnancy Problems (from 03/08/18 to present)    Problem Noted Resolved   Late prenatal care affecting pregnancy, antepartum 04/15/2018 by Natale MilchSchuman, Jerard Bays R, MD No   Grand multiparity with current pregnancy, antepartum 04/15/2018 by Natale MilchSchuman, Joelynn Dust R, MD No   Supervision of high risk pregnancy, antepartum, third trimester 03/12/2018 by Oswaldo ConroySchmid, Jacelyn Y, CNM No   Overview Addendum 04/15/2018 11:40 PM by Natale MilchSchuman, Kiyona Mcnall R, MD    [facility]inic [facility] Prenatal Labs  Dating  Blood type: --/--/A NEG (08/09 1748)   Genetic Screen None, late care Antibody:NEG (08/09 1748)  Anatomic US complete Rubella: 30.80 (08/09 1748) Varicella: 373 (08/09 1748)  GTT   Third trimester:  112 RPR: Non Reactive (08/09 1748)   Rhogam  04/05/18 HBsAg: Negative (08/09 1748)   TDaP vaccine                        Flu Shot: HIV: Non Reactive (08/09 1748)   Baby Food                                MVH:[id number]:Negative (09/26 0000)  Contraception  Tubal  Pap: NEEDS POSTPARTUM!  CBB     CS/VBAC  check breech postion   Support Person                  Gestational age appropriate obstetric precautions including but not limited to vaginal bleeding, contractions, leaking of fluid and fetal movement were reviewed in detail with the patient.    Reports that she has a  history of small infants. Is concerned this pregnancy might also be small. Will obtain growth [place] at next visit.  Discussed dating [place] and due date assignment based on [place] vs. LMP. Declines tdap and flu today- would like at her next visit.   Return in about 2 weeks (around 04/30/2018) for ROB and [place].  Adelene Idler MD [facility], [facility] 04/16/18 10:55 AM

## 2018-04-19 ENCOUNTER — Encounter: Payer: Medicaid Other | Admitting: Obstetrics and Gynecology

## 2018-04-22 ENCOUNTER — Observation Stay
Admission: EM | Admit: 2018-04-22 | Discharge: 2018-04-22 | Disposition: A | Discharge status: Home / Self Care | Payer: Medicaid Other | Attending: Obstetrics & Gynecology | Admitting: Obstetrics & Gynecology

## 2018-04-22 ENCOUNTER — Other Ambulatory Visit: Payer: Self-pay

## 2018-04-22 DIAGNOSIS — Z888 Allergy status to other drugs, medicaments and biological substances status: Secondary | ICD-10-CM | POA: Insufficient documentation

## 2018-04-22 DIAGNOSIS — R109 Unspecified abdominal pain: Secondary | ICD-10-CM | POA: Diagnosis present

## 2018-04-22 DIAGNOSIS — O0930 Supervision of pregnancy with insufficient antenatal care, unspecified trimester: Secondary | ICD-10-CM

## 2018-04-22 DIAGNOSIS — O26893 Other specified pregnancy related conditions, third trimester: Secondary | ICD-10-CM | POA: Diagnosis not present

## 2018-04-22 DIAGNOSIS — N898 Other specified noninflammatory disorders of vagina: Secondary | ICD-10-CM | POA: Diagnosis present

## 2018-04-22 DIAGNOSIS — Z886 Allergy status to analgesic agent status: Secondary | ICD-10-CM | POA: Insufficient documentation

## 2018-04-22 DIAGNOSIS — Z3A34 34 weeks gestation of pregnancy: Secondary | ICD-10-CM

## 2018-04-22 DIAGNOSIS — O9989 Other specified diseases and conditions complicating pregnancy, childbirth and the puerperium: Secondary | ICD-10-CM

## 2018-04-22 DIAGNOSIS — O0940 Supervision of pregnancy with grand multiparity, unspecified trimester: Secondary | ICD-10-CM

## 2018-04-22 DIAGNOSIS — O0943 Supervision of pregnancy with grand multiparity, third trimester: Secondary | ICD-10-CM | POA: Diagnosis not present

## 2018-04-22 DIAGNOSIS — O26899 Other specified pregnancy related conditions, unspecified trimester: Secondary | ICD-10-CM | POA: Diagnosis present

## 2018-04-22 DIAGNOSIS — O0993 Supervision of high risk pregnancy, unspecified, third trimester: Secondary | ICD-10-CM

## 2018-04-22 DIAGNOSIS — Z3490 Encounter for supervision of normal pregnancy, unspecified, unspecified trimester: Secondary | ICD-10-CM

## 2018-04-22 MED ORDER — OXYTOCIN BOLUS FROM INFUSION: 500.0000 mL | Freq: Once | INTRAVENOUS | Status: DC

## 2018-04-22 MED ORDER — ONDANSETRON HCL 4 MG/2ML IJ SOLN: 4.0000 mg | Freq: Four times a day (QID) | INTRAMUSCULAR | Status: DC | PRN

## 2018-04-22 MED ORDER — OXYTOCIN 40 UNITS IN LACTATED RINGERS INFUSION - SIMPLE MED: 2.5000 [IU]/h | INTRAVENOUS | Status: DC

## 2018-04-22 MED ORDER — LACTATED RINGERS IV SOLN: INTRAVENOUS | Status: DC

## 2018-04-22 MED ORDER — ACETAMINOPHEN 325 MG PO TABS: 650.0000 mg | ORAL_TABLET | ORAL | Status: DC | PRN

## 2018-04-22 MED ORDER — LACTATED RINGERS IV SOLN: 500.0000 mL | INTRAVENOUS | Status: DC | PRN

## 2018-04-22 NOTE — Final Progress Note (Signed)
Physician Final Progress Note  Patient ID: Eileen MarionSusan Ann Tate MRN: [id number] DOB/AGE: 03-Jul-1986 32 y.o.  Admit date: 04/22/2018 Admitting provider: Nadara Mustardobert P Taeshawn Helfman, MD Discharge date: 04/22/2018  Admission Diagnoses: Leakage of fluid per vagina, 34 weeks pregnancy  Discharge Diagnoses:  Active Problems:   Abdominal pain affecting pregnancy   Grand multiparity with current pregnancy, antepartum   Vaginal discharge   Consults: None  Significant Findings/ Diagnostic Studies: Pt seen for gush of fluid at 2300 tonight along w lower abdominal cramping.  No other sx's.  Good FM.  No VB.  Prior NSVD x6.  No h/o PTL. Exam reveals a cervix that is closed and a spec exam w no pooling, nitrazine, or ferning No ctxs on toco.  FHTs reassuring. Thus, No s/sx labor or SROM.  Procedures: A NST procedure was performed with FHR monitoring and a normal baseline established, appropriate time of 20-40 minutes of evaluation, and accels >2 seen w 15x15 characteristics.  Results show a REACTIVE NST.   Discharge Condition: good  Disposition: Discharge disposition: 01-Home or Self Care       Diet: Regular diet  Discharge Activity: Activity as tolerated  Discharge Instructions    Call MD for:   Complete by:  As directed    Worsening contractions or pain; leakage of fluid; bleeding.   Diet general   Complete by:  As directed    Increase activity slowly   Complete by:  As directed      Allergies as of 04/22/2018      Reactions   Excedrin Extra Strength [aspirin-acetaminophen-caffeine] Swelling   Throat swelling   Guaifenesin & Derivatives Swelling   Ibuprofen Swelling, Other (See Comments)   Causes blisters      Medication List    TAKE these medications   albuterol 108 (90 Base) MCG/ACT inhaler Commonly known as:  PROVENTIL HFA;VENTOLIN HFA Inhale 2 puffs into the lungs every 6 (six) hours as needed for wheezing or shortness of breath.   butalbital-acetaminophen-caffeine 50-325-40  MG tablet Commonly known as:  FIORICET, ESGIC Take 1-2 tablets by mouth every 6 (six) hours as needed for headache.   hyoscyamine 0.125 MG tablet Commonly known as:  LEVSIN, ANASPAZ Take 1 tablet (0.125 mg total) by mouth every 4 (four) hours as needed.   nitrofurantoin (macrocrystal-monohydrate) 100 MG capsule Commonly known as:  MACROBID Take 1 capsule (100 mg total) by mouth 2 (two) times daily.   prenatal multivitamin Tabs tablet Take 1 tablet by mouth at bedtime. Reported on 10/09/2015      Follow-up Information    Nadara MustardHarris, Betta Balla P, MD. Go in 2 week(s).   Specialty:  Obstetrics and Gynecology Why:  As scheduled Contact information: [address] [place] [address] [phone]           Total time spent taking care of this patient: 15 minutes  Signed: Letitia Libraobert Paul Oluwatosin Bracy 04/22/2018, 2:47 AM

## 2018-04-22 NOTE — Discharge Summary (Signed)
  See FPN 

## 2018-04-22 NOTE — OB Triage Note (Addendum)
Pt is a 34wk2d G7P6 seeen in triage w/ c/o of abdominal pain and LOF. Pt states she had a "big gush of fluid" around 2245.Pt states fluid was clear and moderate amount. Pt states her abdominal pain started around 2245 and states it feels "cramping". Pt rates her her pain a 8/10. Pt denies vaginal bleeding and states positive fetal movement. Nitirizine inconclusive. Monitors applied and assessing.

## 2018-04-22 NOTE — Discharge Instructions (Signed)

## 2018-04-24 ENCOUNTER — Ambulatory Visit (INDEPENDENT_AMBULATORY_CARE_PROVIDER_SITE_OTHER): Payer: Medicaid Other | Admitting: Obstetrics and Gynecology

## 2018-04-24 VITALS — BP 118/70 | Wt 170.0 lb

## 2018-04-24 DIAGNOSIS — O0933 Supervision of pregnancy with insufficient antenatal care, third trimester: Secondary | ICD-10-CM

## 2018-04-24 DIAGNOSIS — O368130 Decreased fetal movements, third trimester, not applicable or unspecified: Secondary | ICD-10-CM

## 2018-04-24 DIAGNOSIS — O0943 Supervision of pregnancy with grand multiparity, third trimester: Secondary | ICD-10-CM

## 2018-04-24 DIAGNOSIS — O0940 Supervision of pregnancy with grand multiparity, unspecified trimester: Secondary | ICD-10-CM

## 2018-04-24 DIAGNOSIS — Z3A34 34 weeks gestation of pregnancy: Secondary | ICD-10-CM | POA: Diagnosis not present

## 2018-04-24 DIAGNOSIS — O0993 Supervision of high risk pregnancy, unspecified, third trimester: Secondary | ICD-10-CM

## 2018-04-24 DIAGNOSIS — O0930 Supervision of pregnancy with insufficient antenatal care, unspecified trimester: Secondary | ICD-10-CM

## 2018-04-24 LAB — FETAL NONSTRESS TEST

## 2018-04-24 NOTE — Progress Notes (Signed)
Routine Prenatal Care Visit  Subjective  Eileen Tate is a 32 y.o. G7P6006 at [redacted]w[redacted]d being seen today for ongoing prenatal care.  She is currently monitored for the following issues for this high-risk pregnancy and has LSIL (low grade squamous intraepithelial lesion) on Pap smear; History of asthma; Abdominal pain affecting pregnancy; Supervision of high risk pregnancy, antepartum, third trimester; Urinary tract infection; Late prenatal care affecting pregnancy, antepartum; Grand multiparity with current pregnancy, antepartum; Vaginal discharge; Pregnancy; and Indication for care in labor and delivery, antepartum on their problem list.  ----------------------------------------------------------------------------------- Patient reports a lot of cramping for the past several days. She thinks she may have ruptured her membranes. Was recently tested and was negative.   Contractions: Irregular. Vag. Bleeding: None.  Movement: (!) Decreased. Denies leaking of fluid.  ----------------------------------------------------------------------------------- The following portions of the patient's history were reviewed and updated as appropriate: allergies, current medications, past family history, past medical history, past social history, past surgical history and problem list. Problem list updated.   Objective  Blood pressure 118/70, weight 170 lb (77.1 kg), last menstrual period 07/26/2017, unknown if currently breastfeeding. Pregravid weight 136 lb (61.7 kg) Total Weight Gain 34 lb (15.4 kg) Urinalysis: Urine Protein    Urine Glucose    Fetal Status: Fetal Heart Rate (bpm): 130 Fundal Height: 35 cm Movement: (!) Decreased  Presentation: Vertex  General:  Alert, oriented and cooperative. Patient is in no acute distress.  Skin: Skin is warm and dry. No rash noted.   Cardiovascular: Normal heart rate noted  Respiratory: Normal respiratory effort, no problems with respiration noted  Abdomen: Soft,  gravid, appropriate for gestational age. Pain/Pressure: Present     Pelvic:  Cervical exam performed Dilation: 1.5 Effacement (%): 50 Station: -3, negative pooling  Extremities: Normal range of motion.  Edema: None  Mental Status: Normal mood and affect. Normal behavior. Normal judgment and thought content.   ROM workup: Negative pooling, negative nitrazine, negative ferning.  NST: Baseline FHR: 130 beats/min Variability: moderate Accelerations: present Decelerations: absent Tocometry: not done  Interpretation:  INDICATIONS: decreased fetal movement RESULTS:  A NST procedure was performed with FHR monitoring and a normal baseline established, appropriate time of 20-40 minutes of evaluation, and accels >2 seen w 15x15 characteristics.  Results show a REACTIVE NST.    Bedside ultrasound:  Cephalic presentation, maximum vertical pocket 4.5 cm.   Assessment   32 y.o. G7P6006 at [redacted]w[redacted]d by  06/01/2018, by Ultrasound presenting for work-in prenatal visit  Plan   7th pregnancy Problems (from 03/08/18 to present)    Problem Noted Resolved   Late prenatal care affecting pregnancy, antepartum 04/15/2018 by Natale Milch, MD No   Grand multiparity with current pregnancy, antepartum 04/15/2018 by Natale Milch, MD No   Supervision of high risk pregnancy, antepartum, third trimester 03/12/2018 by Oswaldo Conroy, CNM No   Overview Addendum 04/16/2018 10:58 AM by Natale Milch, MD    Clinic [facility] Prenatal Labs  Dating  27 wk [place] Blood type: --/--/A NEG (08/09 1748)   Genetic Screen None, late care Antibody:NEG (08/09 1748)  Anatomic [place] complete Rubella: 30.80 (08/09 1748) Varicella: 373 (08/09 1748)  GTT   Third trimester:  112 RPR: Non Reactive (08/09 1748)   Rhogam  04/05/18 HBsAg: Negative (08/09 1748)   TDaP vaccine                        Flu Shot: HIV: Non Reactive (08/09 1748)  Baby Food                                [id number] (09/26 0000)  Contraception   Tubal  Pap: NEEDS POSTPARTUM!  CBB     CS/VBAC  check breech postion   Support Person                 Preterm labor symptoms and general obstetric precautions including but not limited to vaginal bleeding, contractions, leaking of fluid and fetal movement were reviewed in detail with the patient. Please refer to After Visit Summary for other counseling recommendations.   - Given her parity and symptoms, discussed low threshold to go to L&D for increased contractions.   - reassured patient that her membranes are intact.   Return in about 2 weeks (around 05/08/2018).  [name], MD, [facility], [facility] 04/25/2018 10:45 PM

## 2018-04-25 ENCOUNTER — Other Ambulatory Visit: Payer: Self-pay

## 2018-04-25 ENCOUNTER — Telehealth: Payer: Self-pay

## 2018-04-25 ENCOUNTER — Observation Stay
Admission: EM | Admit: 2018-04-25 | Discharge: 2018-04-25 | Disposition: A | Discharge status: Home / Self Care | Payer: Medicaid Other | Attending: Obstetrics & Gynecology | Admitting: Obstetrics & Gynecology

## 2018-04-25 DIAGNOSIS — J45909 Unspecified asthma, uncomplicated: Secondary | ICD-10-CM | POA: Insufficient documentation

## 2018-04-25 DIAGNOSIS — O99513 Diseases of the respiratory system complicating pregnancy, third trimester: Secondary | ICD-10-CM | POA: Diagnosis not present

## 2018-04-25 DIAGNOSIS — O4703 False labor before 37 completed weeks of gestation, third trimester: Principal | ICD-10-CM | POA: Insufficient documentation

## 2018-04-25 DIAGNOSIS — O99333 Smoking (tobacco) complicating pregnancy, third trimester: Secondary | ICD-10-CM | POA: Diagnosis not present

## 2018-04-25 DIAGNOSIS — R109 Unspecified abdominal pain: Secondary | ICD-10-CM

## 2018-04-25 DIAGNOSIS — F17210 Nicotine dependence, cigarettes, uncomplicated: Secondary | ICD-10-CM | POA: Insufficient documentation

## 2018-04-25 DIAGNOSIS — Z3A34 34 weeks gestation of pregnancy: Secondary | ICD-10-CM | POA: Diagnosis not present

## 2018-04-25 DIAGNOSIS — O9989 Other specified diseases and conditions complicating pregnancy, childbirth and the puerperium: Secondary | ICD-10-CM

## 2018-04-25 NOTE — Telephone Encounter (Signed)
Pt is 34 wks & went to L&D last night for contractions. She reports having lots of pelvic pressure w/sharp pains that cause her pelvis to go numb & runs down her leg that last for about 20 secs & then go away. At times she states it feels like she feels the babies head. This is her 7th pregnancy. She did not experience this with any other pregnancy. She was offered apt at 430 today w/CS but declined d/t transportation issues. Pt declines dehydration. States she has had 6 bottles of water today. Was told no restrictions at d/c from [facility]. I advised to rest/stay off feet as much as possible. She has a 64,42 & 33 year old at home w/her today. Bed rest is unlikely.

## 2018-04-25 NOTE — OB Triage Note (Signed)
Pt presents from ED with complaints of abdominal pain and leaking of fluid. Was seen in the [facility] office today where they performed a NST and cervical exam which was closed. Endorses vomiting this evening. Denies decreased fetal movement and sex in the last 24 hours.

## 2018-04-25 NOTE — Discharge Summary (Signed)
Physician Final Progress Note  Patient ID: Eileen Tate MRN: [id number] DOB/AGE: 33/09/1985 33 y.o.  Admit date: 04/25/2018 Admitting provider: Nadara Mustard, MD Discharge date: 04/25/2018   Admission Diagnoses: abdominal cramping, leaking fluid  Discharge Diagnoses:  Active Problems:   Indication for care in labor and delivery, antepartum Abdominal pain affecting pregnancy [facility], currently pregnant Vaginal discharge  History of Present Illness: The patient is a 32 y.o. female G7P6006 at [redacted]w[redacted]d who presents for abdominal cramping that she has had since the previous day. She was seen in the office yesterday and had reactive NST, intact membranes, cervix 1.5 cm, . She had another episode of fluid leaking after being seen in the office and continued to have abdominal cramping that felt like contractions lasting 2-3 minutes and happening every few minutes. She admits positive fetal movement. She denies vaginal bleeding. She denies s/s of UTI. She is seeking reassurance regarding cervical change. Offered to observe for some time with recheck of cervix. She was agreeable, but after a short time requested discharge to home. Discussed comfort measures for uterine irritability  Past Medical History:  Diagnosis Date  . Abscess and cellulitis   . Asthma   . Headache   . Heart murmur   . Tobacco use disorder   . UTI (lower urinary tract infection)     Past Surgical History:  Procedure Laterality Date  . NO PAST SURGERIES      No current facility-administered medications on file prior to encounter.    Current Outpatient Medications on File Prior to Encounter  Medication Sig Dispense Refill  . albuterol (PROVENTIL HFA;VENTOLIN HFA) 108 (90 BASE) MCG/ACT inhaler Inhale 2 puffs into the lungs every 6 (six) hours as needed for wheezing or shortness of breath. 1 Inhaler 0  . butalbital-acetaminophen-caffeine (FIORICET, ESGIC) 50-325-40 MG tablet Take 1-2 tablets by mouth  every 6 (six) hours as needed for headache. 20 tablet 0  . hyoscyamine (LEVSIN, ANASPAZ) 0.125 MG tablet Take 1 tablet (0.125 mg total) by mouth every 4 (four) hours as needed. 30 tablet 0  . Prenatal Vit-Fe Fumarate-FA (PRENATAL MULTIVITAMIN) TABS Take 1 tablet by mouth at bedtime. Reported on 10/09/2015      Allergies  Allergen Reactions  . Excedrin Extra Strength [Aspirin-Acetaminophen-Caffeine] Swelling    Throat swelling  . Guaifenesin & Derivatives Swelling  . Ibuprofen Swelling and Other (See Comments)    Causes blisters    Social History   Socioeconomic History  . Marital status: Married    Spouse name: Richard  . Number of children: 6  . Years of education: Not on file  . Highest education level: Not on file  Occupational History  . Occupation: unemployed  Social Needs  . Financial resource strain: Not on file  . Food insecurity:    Worry: Not on file    Inability: Not on file  . Transportation needs:    Medical: Yes    Non-medical: Yes  Tobacco Use  . Smoking status: Current Every Day Smoker    Packs/day: 0.25    Years: 10.00    Pack years: 2.50    Types: Cigarettes  . Smokeless tobacco: Never Used  Substance and Sexual Activity  . Alcohol use: No    Alcohol/week: 1.0 standard drinks    Types: 1 Cans of beer per week  . Drug use: No  . Sexual activity: Not Currently    Birth control/protection: Surgical    Comment: Tubal ligation  Lifestyle  . Physical activity:  Days per week: Not on file    Minutes per session: Not on file  . Stress: Not on file  Relationships  . Social connections:    Talks on phone: Not on file    Gets together: Not on file    Attends religious service: Not on file    Active member of club or organization: Not on file    Attends meetings of clubs or organizations: Not on file    Relationship status: Not on file  . Intimate partner violence:    Fear of current or ex partner: Not on file    Emotionally abused: Not on file     Physically abused: Not on file    Forced sexual activity: Not on file  Other Topics Concern  . Not on file  Social History Narrative   Joint custody of 2 daughters with ex-husband   Step-daughter in home with 4 other children    Physical Exam: BP 110/66 (BP Location: Right Arm)   Pulse (!) 111   Temp 98.1 F (36.7 C) (Oral)   Resp 18   Ht 5' (1.524 m)   Wt 77.1 kg   LMP 07/26/2017   BMI 33.20 kg/m   Gen: NAD CV: RRR Pulm: CTAB Pelvic: 1.5/50/-3 ballotable, scant discharge, strong smell of urine Negative pooling by speculum exam. Negative nitrazine, ferning Toco: irritability with occasional contraction lasting up to 45 seconds Fetal well being: 130 bpm, moderate variability, +accelerations, 1 decel to 80's with good return to baseline and continued moderate variability. Overall reassuring Ext: no edema  Consults: None  Significant Findings/ Diagnostic Studies: none  Procedures: NST  Discharge Condition: good  Disposition: discharge to home/self care  Diet: Regular diet  Discharge Activity: Activity as tolerated   Discharge Instructions    Discharge activity:  No Restrictions   Complete by:  As directed    Discharge diet:  No restrictions   Complete by:  As directed    Fetal Kick Count:  Lie on our left side for one hour after a meal, and count the number of times your baby kicks.  If it is less than 5 times, get up, move around and drink some juice.  Repeat the test 30 minutes later.  If it is still less than 5 kicks in an hour, notify your doctor.   Complete by:  As directed    No sexual activity restrictions   Complete by:  As directed    Notify physician for a general feeling that "something is not right"   Complete by:  As directed    Notify physician for increase or change in vaginal discharge   Complete by:  As directed    Notify physician for intestinal cramps, with or without diarrhea, sometimes described as "gas pain"   Complete by:  As directed     Notify physician for leaking of fluid   Complete by:  As directed    Notify physician for low, dull backache, unrelieved by heat or Tylenol   Complete by:  As directed    Notify physician for menstrual like cramps   Complete by:  As directed    Notify physician for pelvic pressure   Complete by:  As directed    Notify physician for uterine contractions.  These may be painless and feel like the uterus is tightening or the baby is  "balling up"   Complete by:  As directed    Notify physician for vaginal bleeding   Complete by:  As directed    PRETERM LABOR:  Includes any of the follwing symptoms that occur between 20 - [redacted] weeks gestation.  If these symptoms are not stopped, preterm labor can result in preterm delivery, placing your baby at risk   Complete by:  As directed      Allergies as of 04/25/2018      Reactions   Excedrin Extra Strength [aspirin-acetaminophen-caffeine] Swelling   Throat swelling   Guaifenesin & Derivatives Swelling   Ibuprofen Swelling, Other (See Comments)   Causes blisters      Medication List    STOP taking these medications   nitrofurantoin (macrocrystal-monohydrate) 100 MG capsule Commonly known as:  MACROBID     TAKE these medications   albuterol 108 (90 Base) MCG/ACT inhaler Commonly known as:  PROVENTIL HFA;VENTOLIN HFA Inhale 2 puffs into the lungs every 6 (six) hours as needed for wheezing or shortness of breath.   butalbital-acetaminophen-caffeine 50-325-40 MG tablet Commonly known as:  FIORICET, ESGIC Take 1-2 tablets by mouth every 6 (six) hours as needed for headache.   hyoscyamine 0.125 MG tablet Commonly known as:  LEVSIN, ANASPAZ Take 1 tablet (0.125 mg total) by mouth every 4 (four) hours as needed.   prenatal multivitamin Tabs tablet Take 1 tablet by mouth at bedtime. Reported on 10/09/2015      Follow-up Information    [facility]. Go to.   Specialty:  Obstetrics and Gynecology Why:  regular scheduled  appointment Contact information: [address] [place] [address] [phone]          Total time spent taking care of this patient: 20 minutes  Signed: Tresea Mall, CNM  04/25/2018, 7:04 AM

## 2018-04-27 ENCOUNTER — Observation Stay
Admission: EM | Admit: 2018-04-27 | Discharge: 2018-04-28 | Disposition: A | Discharge status: Home / Self Care | Payer: Medicaid Other | Attending: Obstetrics and Gynecology | Admitting: Obstetrics and Gynecology

## 2018-04-27 DIAGNOSIS — Z888 Allergy status to other drugs, medicaments and biological substances status: Secondary | ICD-10-CM | POA: Insufficient documentation

## 2018-04-27 DIAGNOSIS — O26899 Other specified pregnancy related conditions, unspecified trimester: Secondary | ICD-10-CM | POA: Diagnosis present

## 2018-04-27 DIAGNOSIS — O99513 Diseases of the respiratory system complicating pregnancy, third trimester: Secondary | ICD-10-CM | POA: Insufficient documentation

## 2018-04-27 DIAGNOSIS — F17210 Nicotine dependence, cigarettes, uncomplicated: Secondary | ICD-10-CM | POA: Insufficient documentation

## 2018-04-27 DIAGNOSIS — Z3A35 35 weeks gestation of pregnancy: Secondary | ICD-10-CM | POA: Insufficient documentation

## 2018-04-27 DIAGNOSIS — R109 Unspecified abdominal pain: Secondary | ICD-10-CM | POA: Insufficient documentation

## 2018-04-27 DIAGNOSIS — Z87440 Personal history of urinary (tract) infections: Secondary | ICD-10-CM | POA: Insufficient documentation

## 2018-04-27 DIAGNOSIS — O26893 Other specified pregnancy related conditions, third trimester: Principal | ICD-10-CM | POA: Insufficient documentation

## 2018-04-27 DIAGNOSIS — J45909 Unspecified asthma, uncomplicated: Secondary | ICD-10-CM | POA: Insufficient documentation

## 2018-04-27 DIAGNOSIS — O99333 Smoking (tobacco) complicating pregnancy, third trimester: Secondary | ICD-10-CM | POA: Insufficient documentation

## 2018-04-27 DIAGNOSIS — Z886 Allergy status to analgesic agent status: Secondary | ICD-10-CM | POA: Insufficient documentation

## 2018-04-27 DIAGNOSIS — Z79899 Other long term (current) drug therapy: Secondary | ICD-10-CM | POA: Insufficient documentation

## 2018-04-27 NOTE — OB Triage Note (Signed)
Pt c/o lower abd pain 7/10. Reports LOF, clear.  Pt reports staying in bed all day due to abd pain.  Pt taking Tylenol q4 with no relief.

## 2018-04-28 ENCOUNTER — Other Ambulatory Visit: Payer: Self-pay

## 2018-04-28 DIAGNOSIS — Z79899 Other long term (current) drug therapy: Secondary | ICD-10-CM | POA: Diagnosis not present

## 2018-04-28 DIAGNOSIS — O99333 Smoking (tobacco) complicating pregnancy, third trimester: Secondary | ICD-10-CM | POA: Diagnosis not present

## 2018-04-28 DIAGNOSIS — R109 Unspecified abdominal pain: Secondary | ICD-10-CM | POA: Diagnosis not present

## 2018-04-28 DIAGNOSIS — F17210 Nicotine dependence, cigarettes, uncomplicated: Secondary | ICD-10-CM | POA: Diagnosis not present

## 2018-04-28 DIAGNOSIS — J45909 Unspecified asthma, uncomplicated: Secondary | ICD-10-CM | POA: Diagnosis not present

## 2018-04-28 DIAGNOSIS — Z888 Allergy status to other drugs, medicaments and biological substances status: Secondary | ICD-10-CM | POA: Diagnosis not present

## 2018-04-28 DIAGNOSIS — Z3A35 35 weeks gestation of pregnancy: Secondary | ICD-10-CM | POA: Diagnosis not present

## 2018-04-28 DIAGNOSIS — O9989 Other specified diseases and conditions complicating pregnancy, childbirth and the puerperium: Secondary | ICD-10-CM

## 2018-04-28 DIAGNOSIS — Z87440 Personal history of urinary (tract) infections: Secondary | ICD-10-CM | POA: Diagnosis not present

## 2018-04-28 DIAGNOSIS — Z886 Allergy status to analgesic agent status: Secondary | ICD-10-CM | POA: Diagnosis not present

## 2018-04-28 DIAGNOSIS — O26899 Other specified pregnancy related conditions, unspecified trimester: Secondary | ICD-10-CM | POA: Diagnosis present

## 2018-04-28 DIAGNOSIS — O99513 Diseases of the respiratory system complicating pregnancy, third trimester: Secondary | ICD-10-CM | POA: Diagnosis not present

## 2018-04-28 DIAGNOSIS — O26893 Other specified pregnancy related conditions, third trimester: Secondary | ICD-10-CM | POA: Diagnosis present

## 2018-04-28 LAB — URINALYSIS, ROUTINE W REFLEX MICROSCOPIC
Bilirubin Urine: NEGATIVE
Glucose, UA: NEGATIVE mg/dL
Hgb urine dipstick: NEGATIVE
Ketones, ur: NEGATIVE mg/dL
LEUKOCYTES UA: NEGATIVE
NITRITE: NEGATIVE
PROTEIN: NEGATIVE mg/dL
Specific Gravity, Urine: 1.005 (ref 1.005–1.030)
pH: 7 (ref 5.0–8.0)

## 2018-04-28 MED ORDER — BENZONATATE 100 MG PO CAPS: 100.0000 mg | ORAL_CAPSULE | Freq: Four times a day (QID) | ORAL | 0 refills | Status: DC | PRN

## 2018-04-28 NOTE — Discharge Summary (Signed)
See Final Progress Note 04/28/2018. 

## 2018-04-28 NOTE — Final Progress Note (Signed)
Physician Final Progress Note  Patient ID: Eileen Tate MRN: [id number] DOB/AGE: 01-16-1986 32 y.o.  Admit date: 04/27/2018 Admitting provider: Vena Austria, MD Discharge date: 04/28/2018   Admission Diagnoses:    Abdominal pain affecting pregnancy  Discharge Diagnoses:  Principal Problem:   Abdominal pain affecting pregnancy  History of Present Illness: The patient is a 32 y.o. female G7P6006 at [redacted]w[redacted]d who presents for abdominal pain and pelvic pressure (7/10). The pain is also present in her lower back and radiates to the backs of her legs at times. She states that it has been ongoing all day today, and that she has been resting in bed without improvement in her symptoms. She has ben taking Tylenol with codeine which has not helped. She reports leakage of fluid. She has a persistent cough that has been present for several weeks. She says that her baby has not been moving as much as usual today. She denies vaginal bleeding.  Review of Systems: Review of systems negative unless otherwise noted in HPI.   Past Medical History:  Diagnosis Date  . Abscess and cellulitis   . Asthma   . Headache   . Heart murmur   . Tobacco use disorder   . UTI (lower urinary tract infection)     Past Surgical History:  Procedure Laterality Date  . NO PAST SURGERIES      No current facility-administered medications on file prior to encounter.    Current Outpatient Medications on File Prior to Encounter  Medication Sig Dispense Refill  . acetaminophen-codeine (TYLENOL #3) 300-30 MG tablet Take 1 tablet by mouth every 4 (four) hours as needed for moderate pain.    . butalbital-acetaminophen-caffeine (FIORICET, ESGIC) 50-325-40 MG tablet Take 1-2 tablets by mouth every 6 (six) hours as needed for headache. 20 tablet 0  . Prenatal Vit-Fe Fumarate-FA (PRENATAL MULTIVITAMIN) TABS Take 1 tablet by mouth at bedtime. Reported on 10/09/2015    . albuterol (PROVENTIL HFA;VENTOLIN HFA) 108 (90 BASE)  MCG/ACT inhaler Inhale 2 puffs into the lungs every 6 (six) hours as needed for wheezing or shortness of breath. (Patient not taking: Reported on 04/27/2018) 1 Inhaler 0  . hyoscyamine (LEVSIN, ANASPAZ) 0.125 MG tablet Take 1 tablet (0.125 mg total) by mouth every 4 (four) hours as needed. 30 tablet 0    Allergies  Allergen Reactions  . Excedrin Extra Strength [Aspirin-Acetaminophen-Caffeine] Swelling    Throat swelling  . Guaifenesin & Derivatives Swelling  . Ibuprofen Swelling and Other (See Comments)    Causes blisters    Social History   Socioeconomic History  . Marital status: Married    Spouse name: Richard  . Number of children: 6  . Years of education: Not on file  . Highest education level: Not on file  Occupational History  . Occupation: unemployed  Social Needs  . Financial resource strain: Not on file  . Food insecurity:    Worry: Not on file    Inability: Not on file  . Transportation needs:    Medical: Yes    Non-medical: Yes  Tobacco Use  . Smoking status: Current Every Day Smoker    Packs/day: 0.25    Years: 10.00    Pack years: 2.50    Types: Cigarettes  . Smokeless tobacco: Never Used  Substance and Sexual Activity  . Alcohol use: No    Alcohol/week: 1.0 standard drinks    Types: 1 Cans of beer per week  . Drug use: No  . Sexual activity: Not Currently  Birth control/protection: Surgical    Comment: Tubal ligation  Lifestyle  . Physical activity:    Days per week: Not on file    Minutes per session: Not on file  . Stress: Not on file  Relationships  . Social connections:    Talks on phone: Not on file    Gets together: Not on file    Attends religious service: Not on file    Active member of club or organization: Not on file    Attends meetings of clubs or organizations: Not on file    Relationship status: Not on file  . Intimate partner violence:    Fear of current or ex partner: Not on file    Emotionally abused: Not on file     Physically abused: Not on file    Forced sexual activity: Not on file  Other Topics Concern  . Not on file  Social History Narrative   Joint custody of 2 daughters with ex-husband   Step-daughter in home with 4 other children    Family history: Negative/unremarkable except as detailed in HPI. No family history of birth defects.   Physical Exam: BP 116/78 (BP Location: Right Arm)   Pulse (!) 102   Temp 98.2 F (36.8 C) (Oral)   Resp 16   LMP 07/26/2017   Gen: NAD CV: Tachycardia Pulm: No increased work of breathing Pelvic: SSE: no pooling, small amount of white discharge present. Sample negative for ferning, nitrazine negative. SVE: 0.5/50/-3 Ext: No signs of DVT  NST Baseline: 135 Variability: moderate Accelerations: present Decelerations: absent Tocometry: occasional uterine irritability The patient was monitored for greater than 1 hour, fetal heart rate tracing was deemed reactive, category I tracing,  Consults: None  Significant Findings/ Diagnostic Studies: labs: UA negative  Procedures: NST, SSE  Discharge Condition: good  Disposition: Discharge disposition: 01-Home or Self Care       Diet: Regular diet  Discharge Activity: Activity as tolerated  Discharge Instructions    Discharge activity:  No Restrictions   Complete by:  As directed    Discharge diet:  No restrictions   Complete by:  As directed    Fetal Kick Count:  Lie on our left side for one hour after a meal, and count the number of times your baby kicks.  If it is less than 5 times, get up, move around and drink some juice.  Repeat the test 30 minutes later.  If it is still less than 5 kicks in an hour, notify your doctor.   Complete by:  As directed    LABOR:  When conractions begin, you should start to time them from the beginning of one contraction to the beginning  of the next.  When contractions are 5 - 10 minutes apart or less and have been regular for at least an hour, you should call  your health care provider.   Complete by:  As directed    No sexual activity restrictions   Complete by:  As directed    Notify physician for bleeding from the vagina   Complete by:  As directed    Notify physician for blurring of vision or spots before the eyes   Complete by:  As directed    Notify physician for chills or fever   Complete by:  As directed    Notify physician for fainting spells, "black outs" or loss of consciousness   Complete by:  As directed    Notify physician for increase in vaginal  discharge   Complete by:  As directed    Notify physician for leaking of fluid   Complete by:  As directed    Notify physician for pain or burning when urinating   Complete by:  As directed    Notify physician for pelvic pressure (sudden increase)   Complete by:  As directed    Notify physician for severe or continued nausea or vomiting   Complete by:  As directed    Notify physician for sudden gushing of fluid from the vagina (with or without continued leaking)   Complete by:  As directed    Notify physician for sudden, constant, or occasional abdominal pain   Complete by:  As directed    Notify physician if baby moving less than usual   Complete by:  As directed      Allergies as of 04/28/2018      Reactions   Excedrin Extra Strength [aspirin-acetaminophen-caffeine] Swelling   Throat swelling   Guaifenesin & Derivatives Swelling   Ibuprofen Swelling, Other (See Comments)   Causes blisters      Medication List    STOP taking these medications   butalbital-acetaminophen-caffeine 50-325-40 MG tablet Commonly known as:  FIORICET, ESGIC   hyoscyamine 0.125 MG tablet Commonly known as:  LEVSIN, ANASPAZ     TAKE these medications   acetaminophen-codeine 300-30 MG tablet Commonly known as:  TYLENOL #3 Take 1 tablet by mouth every 4 (four) hours as needed for moderate pain.   albuterol 108 (90 Base) MCG/ACT inhaler Commonly known as:  PROVENTIL HFA;VENTOLIN HFA Inhale  2 puffs into the lungs every 6 (six) hours as needed for wheezing or shortness of breath.   benzonatate 100 MG capsule Commonly known as:  TESSALON Take 1 capsule (100 mg total) by mouth every 6 (six) hours as needed for cough.   prenatal multivitamin Tabs tablet Take 1 tablet by mouth at bedtime. Reported on 10/09/2015      Emphasized comfort measures for pain such as abdominal support belt, may continue Tylenol with codeine. Rx for Tessalon to see if it helps with her cough; discussed that urine leakage is likely with frequent coughing and accounts for the feeling that fluid is coming out.  Signed: Oswaldo Conroy, CNM  04/28/2018, 1:30 AM

## 2018-04-29 ENCOUNTER — Telehealth: Payer: Self-pay

## 2018-04-29 NOTE — Telephone Encounter (Signed)
Attempted to contact pt- recording states unable to complete as dialed. [phone]

## 2018-04-30 ENCOUNTER — Encounter: Payer: Self-pay | Admitting: Obstetrics and Gynecology

## 2018-04-30 ENCOUNTER — Ambulatory Visit (INDEPENDENT_AMBULATORY_CARE_PROVIDER_SITE_OTHER): Payer: Medicaid Other

## 2018-04-30 ENCOUNTER — Ambulatory Visit (INDEPENDENT_AMBULATORY_CARE_PROVIDER_SITE_OTHER): Payer: Medicaid Other | Admitting: Obstetrics and Gynecology

## 2018-04-30 VITALS — BP 118/74 | Wt 172.0 lb

## 2018-04-30 DIAGNOSIS — O0943 Supervision of pregnancy with grand multiparity, third trimester: Secondary | ICD-10-CM

## 2018-04-30 DIAGNOSIS — O0933 Supervision of pregnancy with insufficient antenatal care, third trimester: Secondary | ICD-10-CM

## 2018-04-30 DIAGNOSIS — Z3A35 35 weeks gestation of pregnancy: Secondary | ICD-10-CM | POA: Diagnosis not present

## 2018-04-30 DIAGNOSIS — O360130 Maternal care for anti-D [Rh] antibodies, third trimester, not applicable or unspecified: Secondary | ICD-10-CM

## 2018-04-30 DIAGNOSIS — Z3A33 33 weeks gestation of pregnancy: Secondary | ICD-10-CM

## 2018-04-30 DIAGNOSIS — O0940 Supervision of pregnancy with grand multiparity, unspecified trimester: Secondary | ICD-10-CM

## 2018-04-30 DIAGNOSIS — O0993 Supervision of high risk pregnancy, unspecified, third trimester: Secondary | ICD-10-CM

## 2018-04-30 DIAGNOSIS — O26899 Other specified pregnancy related conditions, unspecified trimester: Secondary | ICD-10-CM

## 2018-04-30 DIAGNOSIS — Z6791 Unspecified blood type, Rh negative: Secondary | ICD-10-CM

## 2018-04-30 DIAGNOSIS — R109 Unspecified abdominal pain: Secondary | ICD-10-CM

## 2018-04-30 DIAGNOSIS — Z362 Encounter for other antenatal screening follow-up: Secondary | ICD-10-CM | POA: Diagnosis not present

## 2018-04-30 DIAGNOSIS — O0930 Supervision of pregnancy with insufficient antenatal care, unspecified trimester: Secondary | ICD-10-CM

## 2018-04-30 NOTE — Progress Notes (Signed)
Routine Prenatal Care Visit  Subjective  Eileen Tate is a 32 y.o. G7P6006 at [redacted]w[redacted]d being seen today for ongoing prenatal care.  She is currently monitored for the following issues for this high-risk pregnancy and has LSIL (low grade squamous intraepithelial lesion) on Pap smear; History of asthma; Abdominal pain affecting pregnancy; Supervision of high risk pregnancy, antepartum, third trimester; Urinary tract infection; Late prenatal care affecting pregnancy, antepartum; Grand multiparity with current pregnancy, antepartum; Vaginal discharge; Pregnancy; Indication for care in labor and delivery, antepartum; Abdominal pain affecting pregnancy, antepartum; and Rh negative state in antepartum period on their problem list.  ----------------------------------------------------------------------------------- Patient reports continues to have lower abdominal cramping and pain and leaking fluid (has been assessed numerous times for ROM, all negative).  Offered cervical check and ROM w/u. Patient declines..   Contractions: Irregular. Vag. Bleeding: None.  Movement: Present. Denies leaking of fluid.  ----------------------------------------------------------------------------------- The following portions of the patient's history were reviewed and updated as appropriate: allergies, current medications, past family history, past medical history, past social history, past surgical history and problem list. Problem list updated.   Objective  Blood pressure 118/74, weight 172 lb (78 kg), last menstrual period 07/26/2017, unknown if currently breastfeeding. Pregravid weight 136 lb (61.7 kg) Total Weight Gain 36 lb (16.3 kg) Urinalysis: Urine Protein    Urine Glucose    Fetal Status: Fetal Heart Rate (bpm): 130 Fundal Height: 35 cm Movement: Present  Presentation: Vertex  General:  Alert, oriented and cooperative. Patient is in no acute distress.  Skin: Skin is warm and dry. No rash noted.    Cardiovascular: Normal heart rate noted  Respiratory: Normal respiratory effort, no problems with respiration noted  Abdomen: Soft, gravid, appropriate for gestational age. Pain/Pressure: Present     Pelvic:  Cervical exam deferred        Extremities: Normal range of motion.  Edema: None  Mental Status: Normal mood and affect. Normal behavior. Normal judgment and thought content.   Imaging Results: US Ob Follow Up  Result Date: 04/30/2018 Patient Name: Antonina Deziel DOB: 11-Dec-1985 MRN: [id number] ULTRASOUND REPORT Location: [facility] Date of Service: 04/30/2018 Indications: Growth/AFI Findings: Mason Jim intrauterine pregnancy is visualized with FHR at 129 BPM. Biometrics give an (U/S) Gestational age of [redacted]w[redacted]d and an (U/S) EDD of 06/02/2018; this correlates with the clinically established Estimated Date of Delivery: 06/01/18. Fetal presentation is Cephalic. Placenta: anterior. Grade: 1 AFI: 16.9 cm Growth percentile is 42.8. EFW: 2,632 grams (5lbs 13oz) Impression: 1. [redacted]w[redacted]d Viable Singleton Intrauterine pregnancy previously established criteria. 2. Growth is 42.8 %ile.  AFI is 16.9 cm. Darlina Guys, RDMS RVT The ultrasound images and findings were reviewed by me and I agree with the above report. [name], MD, [facility], [facility] 04/30/2018 11:53 AM    Assessment   32 y.o. [id number] at [redacted]w[redacted]d by  06/01/2018, by Ultrasound presenting for routine prenatal visit  Plan   7th pregnancy Problems (from 03/08/18 to present)    Problem Noted Resolved   Rh negative state in antepartum period 04/30/2018 by Conard Novak, MD No   Late prenatal care affecting pregnancy, antepartum 04/15/2018 by Natale Milch, MD No   Grand multiparity with current pregnancy, antepartum 04/15/2018 by Natale Milch, MD No   Supervision of high risk pregnancy, antepartum, third trimester 03/12/2018 by Oswaldo Conroy, CNM No   Overview Addendum 04/16/2018 10:58 AM  by Natale Milch, MD    Clinic [facility] Prenatal Labs  Dating  27 wk [place] Blood type: --/--/A NEG (08/09 1748)   Genetic Screen None, late care Antibody:NEG (08/09 1748)  Anatomic [place] complete Rubella: 30.80 (08/09 1748) Varicella: 373 (08/09 1748)  GTT   Third trimester:  112 RPR: Non Reactive (08/09 1748)   Rhogam  04/05/18 HBsAg: Negative (08/09 1748)   TDaP vaccine                        Flu Shot: HIV: Non Reactive (08/09 1748)   Baby Food                                [id number] (09/26 0000)  Contraception  Tubal  Pap: NEEDS POSTPARTUM!  CBB     CS/VBAC  check breech postion   Support Person                  Preterm labor symptoms and general obstetric precautions including but not limited to vaginal bleeding, contractions, leaking of fluid and fetal movement were reviewed in detail with the patient. Please refer to After Visit Summary for other counseling recommendations.   - Urine culture today with continued sx of pressure. Positive culture in August.   - Patient states that she does not want Jaci or Erskine Squibb for delivery due to personality differences. Discussed that we should be able to accommodate her. However, I could make no promises and I could not speak for the other MDs who might be backing up [place] or Erskine Squibb.  Return in about 1 week (around 05/07/2018) for Routine Prenatal Appointment.  [name], MD, [facility], [facility] 04/30/2018 12:22 PM

## 2018-05-02 ENCOUNTER — Inpatient Hospital Stay
Admission: EM | Admit: 2018-05-02 | Discharge: 2018-05-04 | DRG: 796 | Disposition: A | Discharge status: Home / Self Care | Payer: Medicaid Other | Attending: Obstetrics and Gynecology | Admitting: Obstetrics and Gynecology

## 2018-05-02 ENCOUNTER — Inpatient Hospital Stay: Payer: Medicaid Other | Admitting: Anesthesiology

## 2018-05-02 ENCOUNTER — Other Ambulatory Visit: Payer: Self-pay

## 2018-05-02 DIAGNOSIS — Z302 Encounter for sterilization: Secondary | ICD-10-CM

## 2018-05-02 DIAGNOSIS — Z6791 Unspecified blood type, Rh negative: Secondary | ICD-10-CM | POA: Diagnosis not present

## 2018-05-02 DIAGNOSIS — O26893 Other specified pregnancy related conditions, third trimester: Secondary | ICD-10-CM | POA: Diagnosis present

## 2018-05-02 DIAGNOSIS — J45909 Unspecified asthma, uncomplicated: Secondary | ICD-10-CM | POA: Diagnosis present

## 2018-05-02 DIAGNOSIS — O0930 Supervision of pregnancy with insufficient antenatal care, unspecified trimester: Secondary | ICD-10-CM

## 2018-05-02 DIAGNOSIS — O99334 Smoking (tobacco) complicating childbirth: Secondary | ICD-10-CM | POA: Diagnosis present

## 2018-05-02 DIAGNOSIS — D62 Acute posthemorrhagic anemia: Secondary | ICD-10-CM | POA: Diagnosis not present

## 2018-05-02 DIAGNOSIS — O42013 Preterm premature rupture of membranes, onset of labor within 24 hours of rupture, third trimester: Secondary | ICD-10-CM | POA: Diagnosis not present

## 2018-05-02 DIAGNOSIS — O42919 Preterm premature rupture of membranes, unspecified as to length of time between rupture and onset of labor, unspecified trimester: Secondary | ICD-10-CM

## 2018-05-02 DIAGNOSIS — F17210 Nicotine dependence, cigarettes, uncomplicated: Secondary | ICD-10-CM | POA: Diagnosis present

## 2018-05-02 DIAGNOSIS — O42913 Preterm premature rupture of membranes, unspecified as to length of time between rupture and onset of labor, third trimester: Secondary | ICD-10-CM | POA: Diagnosis present

## 2018-05-02 DIAGNOSIS — Z3A35 35 weeks gestation of pregnancy: Secondary | ICD-10-CM | POA: Diagnosis not present

## 2018-05-02 DIAGNOSIS — O26899 Other specified pregnancy related conditions, unspecified trimester: Secondary | ICD-10-CM

## 2018-05-02 DIAGNOSIS — O9952 Diseases of the respiratory system complicating childbirth: Secondary | ICD-10-CM | POA: Diagnosis present

## 2018-05-02 DIAGNOSIS — O0993 Supervision of high risk pregnancy, unspecified, third trimester: Secondary | ICD-10-CM

## 2018-05-02 DIAGNOSIS — O9081 Anemia of the puerperium: Secondary | ICD-10-CM | POA: Diagnosis not present

## 2018-05-02 DIAGNOSIS — O0940 Supervision of pregnancy with grand multiparity, unspecified trimester: Secondary | ICD-10-CM

## 2018-05-02 DIAGNOSIS — O360130 Maternal care for anti-D [Rh] antibodies, third trimester, not applicable or unspecified: Secondary | ICD-10-CM | POA: Diagnosis not present

## 2018-05-02 DIAGNOSIS — O42019 Preterm premature rupture of membranes, onset of labor within 24 hours of rupture, unspecified trimester: Secondary | ICD-10-CM

## 2018-05-02 LAB — CBC
HCT: 34.7 % — ABNORMAL LOW (ref 35.0–47.0)
HEMOGLOBIN: 12.3 g/dL (ref 12.0–16.0)
MCH: 32.6 pg (ref 26.0–34.0)
MCHC: 35.6 g/dL (ref 32.0–36.0)
MCV: 91.7 fL (ref 80.0–100.0)
Platelets: 195 10*3/uL (ref 150–440)
RBC: 3.78 MIL/uL — ABNORMAL LOW (ref 3.80–5.20)
RDW: 13.2 % (ref 11.5–14.5)
WBC: 15.4 10*3/uL — ABNORMAL HIGH (ref 3.6–11.0)

## 2018-05-02 LAB — GROUP B STREP BY PCR: GROUP B STREP BY PCR: NEGATIVE

## 2018-05-02 MED ORDER — FENTANYL 2.5 MCG/ML W/ROPIVACAINE 0.15% IN NS 100 ML EPIDURAL (ARMC): 12.0000 mL/h | EPIDURAL | Status: DC

## 2018-05-02 MED ORDER — PHENYLEPHRINE 40 MCG/ML (10ML) SYRINGE FOR IV PUSH (FOR BLOOD PRESSURE SUPPORT)
80.0000 ug | PREFILLED_SYRINGE | INTRAVENOUS | Status: DC | PRN
  Filled 2018-05-02: qty 5

## 2018-05-02 MED ORDER — LACTATED RINGERS IV SOLN
INTRAVENOUS | Status: DC
  Administered 2018-05-02 – 2018-05-03 (×2): via INTRAVENOUS

## 2018-05-02 MED ORDER — SOD CITRATE-CITRIC ACID 500-334 MG/5ML PO SOLN: 30.0000 mL | ORAL | Status: DC | PRN

## 2018-05-02 MED ORDER — EPHEDRINE 5 MG/ML INJ
10.0000 mg | INTRAVENOUS | Status: DC | PRN
  Filled 2018-05-02: qty 2

## 2018-05-02 MED ORDER — FENTANYL 2.5 MCG/ML W/ROPIVACAINE 0.15% IN NS 100 ML EPIDURAL (ARMC)
EPIDURAL | Status: DC | PRN
  Administered 2018-05-02: 12 mL/h via EPIDURAL

## 2018-05-02 MED ORDER — SODIUM CHLORIDE 0.9 % IV SOLN
2.0000 g | Freq: Once | INTRAVENOUS | Status: AC
  Administered 2018-05-02: 2 g via INTRAVENOUS
  Filled 2018-05-02: qty 2000

## 2018-05-02 MED ORDER — LIDOCAINE HCL (PF) 1 % IJ SOLN
30.0000 mL | INTRAMUSCULAR | Status: AC | PRN
  Administered 2018-05-02: 1.2 mL via SUBCUTANEOUS

## 2018-05-02 MED ORDER — OXYTOCIN BOLUS FROM INFUSION
500.0000 mL | Freq: Once | INTRAVENOUS | Status: AC
  Administered 2018-05-03: 500 mL via INTRAVENOUS

## 2018-05-02 MED ORDER — LACTATED RINGERS IV SOLN: 500.0000 mL | Freq: Once | INTRAVENOUS | Status: DC

## 2018-05-02 MED ORDER — LIDOCAINE HCL (PF) 1 % IJ SOLN
INTRAMUSCULAR | Status: AC
  Filled 2018-05-02: qty 30

## 2018-05-02 MED ORDER — LACTATED RINGERS IV SOLN: 500.0000 mL | INTRAVENOUS | Status: DC | PRN

## 2018-05-02 MED ORDER — OXYTOCIN 10 UNIT/ML IJ SOLN
INTRAMUSCULAR | Status: AC
  Filled 2018-05-02: qty 2

## 2018-05-02 MED ORDER — OXYTOCIN 40 UNITS IN LACTATED RINGERS INFUSION - SIMPLE MED
2.5000 [IU]/h | INTRAVENOUS | Status: DC
  Filled 2018-05-02: qty 1000

## 2018-05-02 MED ORDER — DIPHENHYDRAMINE HCL 50 MG/ML IJ SOLN: 12.5000 mg | INTRAMUSCULAR | Status: DC | PRN

## 2018-05-02 MED ORDER — OXYTOCIN 10 UNIT/ML IJ SOLN: 10.0000 [IU] | Freq: Once | INTRAMUSCULAR | Status: DC

## 2018-05-02 MED ORDER — ONDANSETRON HCL 4 MG/2ML IJ SOLN: 4.0000 mg | Freq: Four times a day (QID) | INTRAMUSCULAR | Status: DC | PRN

## 2018-05-02 MED ORDER — LIDOCAINE-EPINEPHRINE (PF) 1.5 %-1:200000 IJ SOLN
INTRAMUSCULAR | Status: DC | PRN
  Administered 2018-05-02: 3 mL via EPIDURAL

## 2018-05-02 MED ORDER — MISOPROSTOL 200 MCG PO TABS
ORAL_TABLET | ORAL | Status: AC
  Administered 2018-05-03: 800 ug via RECTAL
  Filled 2018-05-02: qty 4

## 2018-05-02 MED ORDER — AMMONIA AROMATIC IN INHA
RESPIRATORY_TRACT | Status: AC
  Filled 2018-05-02: qty 10

## 2018-05-02 MED ORDER — FENTANYL 2.5 MCG/ML W/ROPIVACAINE 0.15% IN NS 100 ML EPIDURAL (ARMC)
EPIDURAL | Status: AC
  Filled 2018-05-02: qty 100

## 2018-05-02 NOTE — Anesthesia Preprocedure Evaluation (Signed)
Anesthesia Evaluation  Patient identified by MRN, date of birth, ID band Patient awake    Reviewed: Allergy & Precautions, NPO status , Patient's Chart, lab work & pertinent test results  History of Anesthesia Complications Negative for: history of anesthetic complications  Airway Mallampati: III       Dental  (+) Missing, Chipped, Poor Dentition   Pulmonary asthma , neg sleep apnea, neg COPD, Current Smoker,           Cardiovascular (-) hypertension(-) Past MI and (-) CHF (-) dysrhythmias + Valvular Problems/Murmurs (murmur, as a child)      Neuro/Psych neg Seizures    GI/Hepatic Neg liver ROS, GERD  ,  Endo/Other  neg diabetes  Renal/GU negative Renal ROS     Musculoskeletal   Abdominal   Peds  Hematology   Anesthesia Other Findings   Reproductive/Obstetrics (+) Pregnancy                             Anesthesia Physical Anesthesia Plan  ASA: II  Anesthesia Plan: Epidural   Post-op Pain Management:    Induction:   PONV Risk Score and Plan:   Airway Management Planned:   Additional Equipment:   Intra-op Plan:   Post-operative Plan:   Informed Consent: I have reviewed the patients History and Physical, chart, labs and discussed the procedure including the risks, benefits and alternatives for the proposed anesthesia with the patient or authorized representative who has indicated his/her understanding and acceptance.     Plan Discussed with:   Anesthesia Plan Comments:         Anesthesia Quick Evaluation

## 2018-05-02 NOTE — Anesthesia Procedure Notes (Signed)
Epidural Patient location during procedure: OB Start time: 05/02/2018 11:00 PM End time: 05/02/2018 11:24 PM  Staffing Performed: anesthesiologist   Preanesthetic Checklist Completed: patient identified, site marked, surgical consent, pre-op evaluation, timeout performed, IV checked, risks and benefits discussed and monitors and equipment checked  Epidural Patient position: sitting Prep: Betadine Patient monitoring: heart rate, continuous pulse ox and blood pressure Approach: midline Location: L4-L5 Injection technique: LOR saline  Needle:  Needle type: Tuohy  Needle gauge: 18 G Needle length: 9 cm and 9 Needle insertion depth: 8 cm Catheter type: closed end flexible Catheter size: 20 Guage Catheter at skin depth: 14 cm Test dose: negative and 1.5% lidocaine with Epi 1:200 K  Assessment Events: blood not aspirated, injection not painful, no injection resistance, negative IV test and no paresthesia  Additional Notes   Patient tolerated the insertion well without complications.Reason for block:procedure for pain

## 2018-05-02 NOTE — H&P (Signed)
OB History & Physical   History of Present Illness:  Chief Complaint: water broke and contractions  HPI:  Eileen Tate is a 32 y.o. Z6X0960 female at [redacted]w[redacted]d dated by 27 week ultrasound.  Her pregnancy has been complicated by Rh negative, late to prenatal care, grand multiparity.    She reports contractions.   She reports leakage of fluid.   She denies vaginal bleeding.   She reports fetal movement.    Maternal Medical History:   Past Medical History:  Diagnosis Date  . Abscess and cellulitis   . Asthma   . Headache   . Heart murmur   . Tobacco use disorder   . UTI (lower urinary tract infection)     Past Surgical History:  Procedure Laterality Date  . NO PAST SURGERIES      Allergies  Allergen Reactions  . Excedrin Extra Strength [Aspirin-Acetaminophen-Caffeine] Swelling    Throat swelling  . Guaifenesin & Derivatives Swelling  . Ibuprofen Swelling and Other (See Comments)    Causes blisters    Prior to Admission medications   Medication Sig Start Date End Date Taking? Authorizing Provider  albuterol (PROVENTIL HFA;VENTOLIN HFA) 108 (90 BASE) MCG/ACT inhaler Inhale 2 puffs into the lungs every 6 (six) hours as needed for wheezing or shortness of breath. Patient not taking: Reported on 04/27/2018 07/19/15   Rebecka Apley, MD  benzonatate (TESSALON PERLES) 100 MG capsule Take 1 capsule (100 mg total) by mouth every 6 (six) hours as needed for cough. 04/28/18 04/28/19  Oswaldo Conroy, CNM  Prenatal Vit-Fe Fumarate-FA (PRENATAL MULTIVITAMIN) TABS Take 1 tablet by mouth at bedtime. Reported on 10/09/2015    [provider]    OB History  Gravida Para Term Preterm AB Living  7 6 6  0 0 6  SAB TAB Ectopic Multiple Live Births  0 0 0 0 4    # Outcome Date GA Lbr Len/2nd Weight Sex Delivery Anes PTL Lv  7 Current           6 Term 04/28/17 [redacted]w[redacted]d 01:55 / 00:10 2290 g F Vag-Spont EPI  LIV  5 Term 10/10/15 [redacted]w[redacted]d / 00:05 2480 g F Vag-Spont EPI    4 Term  06/08/14     Vag-Spont     3 Term 02/25/13 [redacted]w[redacted]d 09:00 / 00:26 2523 g M Vag-Spont EPI  LIV  2 Term 01/12/07 [redacted]w[redacted]d  2863 g F Vag-Spont None  LIV  1 Term 02/13/04 [redacted]w[redacted]d  2438 g F Vag-Spont None  LIV    Prenatal care site: [facility]  Social History: She  reports that she has been smoking cigarettes. She has a 2.50 pack-year smoking history. She has never used smokeless tobacco. She reports that she does not drink alcohol or use drugs.  Family History: family history includes Cancer (age of onset: 53) in her maternal aunt; Cancer (age of onset: 61) in her paternal aunt; Diabetes in her paternal grandmother; Heart disease in her paternal grandmother.   Review of Systems: Negative x 10 systems reviewed except as noted in the HPI.    Physical Exam:  Vital Signs: BP 117/73 (BP Location: Left Arm)   Pulse (!) 116   Temp 98.3 F (36.8 C) (Oral)   Resp 18   Ht 5' (1.524 m)   Wt 77.1 kg   LMP 07/26/2017   BMI 33.20 kg/m  Constitutional: Well nourished, well developed female in no acute distress.  HEENT: normal Skin: Warm and dry.  Cardiovascular: Regular rate  and rhythm.   Extremity: no edema  Respiratory: Clear to auscultation bilateral. Normal respiratory effort Abdomen: FHT present, gravid, NT Back: no CVAT Neuro: DTRs 2+, Cranial nerves grossly intact Psych: Alert and Oriented x3. No memory deficits. Normal mood and affect.  MS: normal gait, normal bilateral lower extremity ROM/strength/stability.  Pelvic exam: (female chaperone present) 5 cm/BBOW, ROM workup: +nitrazine, equivocal ferning, +pooling.    Pertinent Results:  Prenatal Labs: Blood type/Rh A negative  Antibody screen negative  Rubella Immune  Varicella Immune    RPR NR  HBsAg negative  HIV negative  GC negative  Chlamydia negative  Genetic screening Did not have done  1 hour GTT 112  3 hour GTT n/a  GBS unknown - PCR pending (no PCN allergy)   Baseline FHR: 135 beats/min   Variability: moderate    Accelerations: present   Decelerations: absent Contractions: present frequency: 3-4 q 10 min Overall assessment: cat 1  Bedside Ultrasound:  Number of Fetus: 1  Presentation: cephalic  Assessment:  Eileen Tate is a 32 y.o. G1P6006 female at [redacted]w[redacted]d with PPROM, active labor.   Plan:  1. Admit to Labor & Delivery  2. CBC, T&S, Clrs, IVF 3. GBS unknown - ampicillin. GBS PCR. Will discontinued ampicillin, if negative.   4. Fetwal well-being: reassuring 5. Expectant management. Will manage actively, if stops laboring.   [name], MD 05/02/2018 8:00 PM

## 2018-05-03 DIAGNOSIS — O42013 Preterm premature rupture of membranes, onset of labor within 24 hours of rupture, third trimester: Secondary | ICD-10-CM

## 2018-05-03 DIAGNOSIS — Z3A35 35 weeks gestation of pregnancy: Secondary | ICD-10-CM

## 2018-05-03 DIAGNOSIS — O360130 Maternal care for anti-D [Rh] antibodies, third trimester, not applicable or unspecified: Secondary | ICD-10-CM

## 2018-05-03 LAB — CBC
HCT: 33.7 % — ABNORMAL LOW (ref 35.0–47.0)
HEMOGLOBIN: 11.8 g/dL — AB (ref 12.0–16.0)
MCH: 32.2 pg (ref 26.0–34.0)
MCHC: 35.1 g/dL (ref 32.0–36.0)
MCV: 91.7 fL (ref 80.0–100.0)
PLATELETS: 183 10*3/uL (ref 150–440)
RBC: 3.68 MIL/uL — ABNORMAL LOW (ref 3.80–5.20)
RDW: 13.3 % (ref 11.5–14.5)
WBC: 15.7 10*3/uL — ABNORMAL HIGH (ref 3.6–11.0)

## 2018-05-03 LAB — TYPE AND SCREEN
ABO/RH(D): A NEG
Antibody Screen: POSITIVE
UNIT DIVISION: 0
UNIT DIVISION: 0

## 2018-05-03 LAB — BPAM RBC
BLOOD PRODUCT EXPIRATION DATE: 201911022359
BLOOD PRODUCT EXPIRATION DATE: 201911022359
Unit Type and Rh: 600
Unit Type and Rh: 600

## 2018-05-03 LAB — URINE CULTURE

## 2018-05-03 MED ORDER — BENZOCAINE-MENTHOL 20-0.5 % EX AERO
1.0000 "application " | INHALATION_SPRAY | CUTANEOUS | Status: DC | PRN
  Administered 2018-05-03: 1 via TOPICAL
  Filled 2018-05-03: qty 56

## 2018-05-03 MED ORDER — WITCH HAZEL-GLYCERIN EX PADS: 1.0000 "application " | MEDICATED_PAD | CUTANEOUS | Status: DC | PRN

## 2018-05-03 MED ORDER — PRENATAL MULTIVITAMIN CH
1.0000 | ORAL_TABLET | Freq: Every day | ORAL | Status: DC
  Administered 2018-05-03 – 2018-05-04 (×2): 1 via ORAL
  Filled 2018-05-03 (×2): qty 1

## 2018-05-03 MED ORDER — DIBUCAINE 1 % RE OINT: 1.0000 "application " | TOPICAL_OINTMENT | RECTAL | Status: DC | PRN

## 2018-05-03 MED ORDER — LACTATED RINGERS IV SOLN
INTRAVENOUS | Status: DC
  Administered 2018-05-04 (×2): via INTRAVENOUS

## 2018-05-03 MED ORDER — SENNOSIDES-DOCUSATE SODIUM 8.6-50 MG PO TABS
2.0000 | ORAL_TABLET | ORAL | Status: DC
  Filled 2018-05-03: qty 2

## 2018-05-03 MED ORDER — ACETAMINOPHEN 325 MG PO TABS
650.0000 mg | ORAL_TABLET | ORAL | Status: DC | PRN
  Administered 2018-05-03 (×2): 650 mg via ORAL
  Filled 2018-05-03 (×2): qty 2

## 2018-05-03 MED ORDER — SIMETHICONE 80 MG PO CHEW: 80.0000 mg | CHEWABLE_TABLET | ORAL | Status: DC | PRN

## 2018-05-03 MED ORDER — ONDANSETRON HCL 4 MG PO TABS: 4.0000 mg | ORAL_TABLET | ORAL | Status: DC | PRN

## 2018-05-03 MED ORDER — COCONUT OIL OIL: 1.0000 "application " | TOPICAL_OIL | Status: DC | PRN

## 2018-05-03 MED ORDER — ONDANSETRON HCL 4 MG/2ML IJ SOLN: 4.0000 mg | INTRAMUSCULAR | Status: DC | PRN

## 2018-05-03 MED ORDER — FERROUS SULFATE 325 (65 FE) MG PO TABS
325.0000 mg | ORAL_TABLET | Freq: Two times a day (BID) | ORAL | Status: DC
  Administered 2018-05-03 – 2018-05-04 (×3): 325 mg via ORAL
  Filled 2018-05-03 (×3): qty 1

## 2018-05-03 MED ORDER — TRAMADOL HCL 50 MG PO TABS
50.0000 mg | ORAL_TABLET | Freq: Four times a day (QID) | ORAL | Status: DC | PRN
  Administered 2018-05-03 – 2018-05-04 (×2): 50 mg via ORAL
  Filled 2018-05-03 (×3): qty 1

## 2018-05-03 MED ORDER — DIPHENHYDRAMINE HCL 25 MG PO CAPS: 25.0000 mg | ORAL_CAPSULE | Freq: Four times a day (QID) | ORAL | Status: DC | PRN

## 2018-05-03 NOTE — Progress Notes (Signed)
Labor Check  Subj:  Complaints: comfortable now w epidural   Obj:  BP (!) 104/58   Pulse 81   Temp 98.6 F (37 C) (Oral)   Resp 18   Ht 5' (1.524 m)   Wt 77.1 kg   LMP 07/26/2017   SpO2 100%   BMI 33.20 kg/m     Cervix: Dilation: 7.5 / Effacement (%): 100 /     IUPC placed without difficulty  Baseline FHR: 130 beats/min   Variability: moderate   Accelerations: present   Decelerations: present (recurrent variable decelerations, short duration with quick return to baseline to 80s) Contractions: present frequency: not tracing well  Overall assessment: cat 2, overall reassuring  A/P: 32 y.o. Z6X0960 female at [redacted]w[redacted]d with PPROM, preterm labor.  1.  Labor: will add pitocin once fetal tracing improves  2.  FWB: reassuring overall, Overall assessment: category 2. Adding amnioinfusion to improve fetal tracing. Monitor closely 3.  GBS negative (PCR has returned as negative)  4.  Pain: epidural 5.  Recheck: prn   [name], MD, [facility], [facility] 05/03/2018 12:06 AM

## 2018-05-03 NOTE — Progress Notes (Signed)
Obstetric Postpartum Daily Progress Note Subjective:  32 y.o. Z6X0960 postpartum day #0 status post vaginal delivery.  She is ambulating, is tolerating po, is voiding spontaneously.  Her pain is well controlled on PO pain medications. Her lochia is less than menses.   Medications SCHEDULED MEDICATIONS  . ferrous sulfate  325 mg Oral BID WC  . prenatal multivitamin  1 tablet Oral Q1200  . [START ON 05/04/2018] senna-docusate  2 tablet Oral Q24H    MEDICATION INFUSIONS    PRN MEDICATIONS  acetaminophen, benzocaine-Menthol, coconut oil, witch hazel-glycerin **AND** dibucaine, diphenhydrAMINE, ondansetron **OR** ondansetron (ZOFRAN) IV, simethicone, traMADol    Objective:   Vitals:   05/03/18 0320 05/03/18 0425 05/03/18 0517 05/03/18 0733  BP: 105/74 99/66 102/69 117/71  Pulse: (!) 112 69 67 66  Resp:  18  20  Temp:  98.7 F (37.1 C) 99 F (37.2 C) 98.6 F (37 C)  TempSrc:  Oral Oral Oral  SpO2:  98% 98% 99%  Weight:      Height:        Current Vital Signs 24h Vital Sign Ranges  T 98.6 F (37 C) Temp  Avg: 98.5 F (36.9 C)  Min: 98 F (36.7 C)  Max: 99 F (37.2 C)  BP 117/71 BP  Min: 88/44  Max: 118/75  HR 66 Pulse  Avg: 85.1  Min: 61  Max: 116  RR 20 Resp  Avg: 18.4  Min: 18  Max: 20  SaO2 99 %   SpO2  Avg: 99.5 %  Min: 98 %  Max: 100 %       24 Hour I/O Current Shift I/O  Time Ins Outs 10/03 0701 - 10/04 0700 In: 155.2 [I.V.:155.2] Out: 316 [Urine:51] No intake/output data recorded.  General: NAD Pulmonary: no increased work of breathing Abdomen: non-distended, non-tender, fundus firm at level of umbilicus Extremities: no edema, no erythema, no tenderness  Labs:  Recent Labs  Lab 05/02/18 2008 05/03/18 0656  WBC 15.4* 15.7*  HGB 12.3 11.8*  HCT 34.7* 33.7*  PLT 195 183     Assessment:   32 y.o. A5W0981 postpartum day # 0 status post preterm SVD  Plan:   1) Acute blood loss anemia - hemodynamically stable and asymptomatic - po ferrous sulfate  2)  A NEG / Rubella 30.80 (08/09 1748)/ Varicella Immune  3) TDAP status did not receive  4) formula feeding /Contraception = plans BTL tomorrow. Case posted for the AM. 32 y.o. [id number]  with undesired fertility, desires permanent sterilization.  Other reversible forms of contraception were discussed with patient; she declines all other modalities. Permanent nature of as well as associated risks of the procedure discussed with patient including but not limited to: risk of regret, permanence of method, bleeding, infection, injury to surrounding organs and need for additional procedures.  Failure risk of 0.5-1% with increased risk of ectopic gestation if pregnancy occurs was also discussed with patient.    5) Disposition: plan for discharge tomorrow after recovers from BTL.  [name], MD 05/03/2018 8:16 AM

## 2018-05-03 NOTE — Discharge Summary (Signed)
OB Discharge Summary     Patient Name: Eileen Tate DOB: 1985/12/21 MRN: [id number]  Date of admission: 05/02/2018 Delivering MD: [name], MD  Date of Delivery: 05/03/2018  Date of discharge: 05/04/2018  Admitting diagnosis:  Preterm premature rupture of membranes with onset of labor within 24 hours Intrauterine pregnancy: [redacted]w[redacted]d     Secondary diagnosis: None     Discharge diagnosis: Preterm Pregnancy Delivered                                                                                                Post partum procedures:postpartum tubal ligation  Augmentation: none  Complications: None  Hospital course:  Onset of Labor With Vaginal Delivery     32 y.o. yo [id number] at [redacted]w[redacted]d was admitted in Active Labor on 05/02/2018. Patient had an uncomplicated labor course as follows:  Membrane Rupture Time/Date: 10:15 PM ,05/02/2018   Intrapartum Procedures: Episiotomy: None [1]                                         Lacerations:  None [1]  Patient had a delivery of a Viable infant. 05/03/2018  Information for the patient's newborn:  Sumiko, Ceasar [[id number]]  Delivery Method: Vag-Spont    Pateint had an uncomplicated postpartum course.  On postpartum day #1 she underwent a postpartum bilateral tubal ligation under spinal anesthesia, which occurred without incident. Refer to operative report for details.  She is ambulating, tolerating a regular diet, passing flatus, and urinating well. Patient is discharged home in stable condition on 05/04/18.   Physical exam  Vitals:   05/04/18 1005 05/04/18 1020 05/04/18 1147 05/04/18 1233  BP: 105/68 105/73 99/64 102/60  Pulse: 74 70 (!) 55 63  Resp: 14 20 18 18   Temp:   98.2 F (36.8 C) 98.1 F (36.7 C)  TempSrc:   Oral Oral  SpO2: 98% 99% 100% 100%  Weight:      Height:       General: alert, cooperative and no distress Lochia: appropriate Uterine Fundus: firm Incision: Healing well with no significant drainage DVT  Evaluation: No evidence of DVT seen on physical exam.  Labs: Lab Results  Component Value Date   WBC 15.7 (H) 05/03/2018   HGB 11.8 (L) 05/03/2018   HCT 33.7 (L) 05/03/2018   MCV 91.7 05/03/2018   PLT 183 05/03/2018    Discharge instruction: per After Visit Summary.  Medications:  Allergies as of 05/04/2018      Reactions   Excedrin Extra Strength [aspirin-acetaminophen-caffeine] Swelling   Throat swelling   Guaifenesin & Derivatives Swelling   Ibuprofen Swelling, Other (See Comments)   Causes blisters      Medication List    STOP taking these medications   acetaminophen-codeine 300-30 MG tablet Commonly known as:  TYLENOL #3   albuterol 108 (90 Base) MCG/ACT inhaler Commonly known as:  PROVENTIL HFA;VENTOLIN HFA     TAKE these medications   benzonatate 100 MG capsule Commonly known as:  TESSALON Take 1  capsule (100 mg total) by mouth every 6 (six) hours as needed for cough.   prenatal multivitamin Tabs tablet Take 1 tablet by mouth at bedtime. Reported on 10/09/2015       Diet: routine diet  Activity: Advance as tolerated. Pelvic rest for 6 weeks.   Outpatient follow up: Follow-up Information    Conard Novak, MD. Schedule an appointment as soon as possible for a visit in 6 week(s).   Specialty:  Obstetrics and Gynecology Why:  Postpartum follow up Contact information: [address] [place] [address] [phone]             Postpartum contraception: Tubal Ligation Rhogam Given postpartum: no, infant is Rh negative.  Rubella vaccine given postpartum: no Varicella vaccine given postpartum: no TDaP given antepartum or postpartum: no  Newborn Data: Live born female  Birth Weight:  2,420 grams APGAR: 8, 9  Newborn Delivery   Birth date/time:  05/03/2018 00:55:00 Delivery type:  Vaginal, Spontaneous      Baby Feeding: formula  Disposition:home with mother  SIGNED: Tresea Mall, CNM

## 2018-05-03 NOTE — Clinical Social Work Maternal (Signed)
  CLINICAL SOCIAL WORK MATERNAL/CHILD NOTE  Patient Details  Name: Eileen Tate MRN: [id number] Date of Birth: [birth year]  Date:  05/03/2018  Clinical Social Worker Initiating Note:  York Spaniel MSW,LCSW Date/Time: Initiated:  05/03/18/      Child's Name:      Biological Parents:  Mother, Father   Need for Interpreter:  None   Reason for Referral:      Address:  [address] [place] [address]    Phone number:  [phone] (home) [phone] (work)    Additional phone number: none  Household Members/Support Persons (HM/SP):       HM/SP Name Relationship DOB or Age  HM/SP -1        HM/SP -2        HM/SP -3        HM/SP -4        HM/SP -5        HM/SP -6        HM/SP -7        HM/SP -8          Natural Supports (not living in the home):  Teacher, music Supports:     Employment: Unemployed   Type of Work:     Education:      Homebound arranged:    Surveyor, quantity Resources:  Medicaid   Other Resources:  Sales executive , WIC   Cultural/Religious Considerations Which May Impact Care: none  Strengths:  Compliance with medical plan , Home prepared for child , Ability to meet basic needs    Psychotropic Medications:         Pediatrician:       Pediatrician List:   Radiographer, therapeutic    [place]    [place] [facility]      Pediatrician Fax Number:    Risk Factors/Current Problems:  Other (Comment)(patient asking for resources from nurse)   Cognitive State:  Alert , Able to Concentrate    Mood/Affect:  Calm , Happy    CSW Assessment: CSW spoke with patient regarding her request of resources. Patient informed CSW that her husband and other four children live in the home. She stated her husband lost his job during the pregnancy but he is now employed. Patient states they are behind on their rent but that the landlord is working with them. She has food stamps, WIC, and medicaid. She  denies history of substance abuse. She denies history of mental illness. CSW did provide education regarding postpartum depression. Patient is aware of community resources in the event she is in need of further baby supplies. She is strongly relying on her church for help and they are supposed to get her a car seat. She received a car seat from the hospital last year when she delivered her previous baby. No additional concerns at this time.   CSW Plan/Description:  Other Information/Referral to [employer], [place] 05/03/2018, 12:21 PM

## 2018-05-04 ENCOUNTER — Inpatient Hospital Stay: Payer: Medicaid Other | Admitting: Anesthesiology

## 2018-05-04 ENCOUNTER — Encounter
Admission: EM | Disposition: A | Discharge status: Home / Self Care | Payer: Self-pay | Source: Home / Self Care | Attending: Obstetrics and Gynecology

## 2018-05-04 DIAGNOSIS — Z302 Encounter for sterilization: Secondary | ICD-10-CM | POA: Diagnosis present

## 2018-05-04 HISTORY — PX: TUBAL LIGATION: SHX77

## 2018-05-04 LAB — RPR: RPR: NONREACTIVE

## 2018-05-04 SURGERY — LIGATION, FALLOPIAN TUBE, POSTPARTUM
Anesthesia: Spinal

## 2018-05-04 MED ORDER — MENTHOL 3 MG MT LOZG
1.0000 | LOZENGE | OROMUCOSAL | Status: DC | PRN
  Filled 2018-05-04: qty 9

## 2018-05-04 MED ORDER — ALBUTEROL SULFATE HFA 108 (90 BASE) MCG/ACT IN AERS: 2.0000 | INHALATION_SPRAY | Freq: Four times a day (QID) | RESPIRATORY_TRACT | 2 refills | Status: DC | PRN

## 2018-05-04 MED ORDER — DIPHENHYDRAMINE HCL 50 MG/ML IJ SOLN
12.5000 mg | Freq: Once | INTRAMUSCULAR | Status: AC
  Administered 2018-05-04: 12.5 mg via INTRAVENOUS

## 2018-05-04 MED ORDER — PROPOFOL 10 MG/ML IV BOLUS
INTRAVENOUS | Status: AC
  Filled 2018-05-04: qty 40

## 2018-05-04 MED ORDER — PROPOFOL 500 MG/50ML IV EMUL
INTRAVENOUS | Status: DC | PRN
  Administered 2018-05-04: 50 ug/kg/min via INTRAVENOUS

## 2018-05-04 MED ORDER — BUPIVACAINE HCL (PF) 0.5 % IJ SOLN
INTRAMUSCULAR | Status: AC
  Filled 2018-05-04: qty 30

## 2018-05-04 MED ORDER — LACTATED RINGERS IV SOLN: INTRAVENOUS | Status: DC

## 2018-05-04 MED ORDER — DIPHENHYDRAMINE HCL 50 MG/ML IJ SOLN
INTRAMUSCULAR | Status: AC
  Filled 2018-05-04: qty 1

## 2018-05-04 MED ORDER — EPHEDRINE SULFATE 50 MG/ML IJ SOLN
INTRAMUSCULAR | Status: DC | PRN
  Administered 2018-05-04: 10 mg via INTRAVENOUS

## 2018-05-04 MED ORDER — DEXAMETHASONE SODIUM PHOSPHATE 10 MG/ML IJ SOLN
INTRAMUSCULAR | Status: AC
  Filled 2018-05-04: qty 1

## 2018-05-04 MED ORDER — FENTANYL CITRATE (PF) 100 MCG/2ML IJ SOLN
INTRAMUSCULAR | Status: AC
  Filled 2018-05-04: qty 2

## 2018-05-04 MED ORDER — GLYCOPYRROLATE 0.2 MG/ML IJ SOLN
INTRAMUSCULAR | Status: AC
  Filled 2018-05-04: qty 1

## 2018-05-04 MED ORDER — SUGAMMADEX SODIUM 200 MG/2ML IV SOLN
INTRAVENOUS | Status: AC
  Filled 2018-05-04: qty 2

## 2018-05-04 MED ORDER — DOCUSATE SODIUM 100 MG PO CAPS
100.0000 mg | ORAL_CAPSULE | Freq: Two times a day (BID) | ORAL | Status: DC
  Filled 2018-05-04: qty 1

## 2018-05-04 MED ORDER — GLYCOPYRROLATE 0.2 MG/ML IJ SOLN
INTRAMUSCULAR | Status: DC | PRN
  Administered 2018-05-04: 0.2 mg via INTRAVENOUS

## 2018-05-04 MED ORDER — PHENYLEPHRINE HCL 10 MG/ML IJ SOLN
INTRAMUSCULAR | Status: DC | PRN
  Administered 2018-05-04: 100 ug via INTRAVENOUS

## 2018-05-04 MED ORDER — MIDAZOLAM HCL 2 MG/2ML IJ SOLN
INTRAMUSCULAR | Status: DC | PRN
  Administered 2018-05-04: 2 mg via INTRAVENOUS

## 2018-05-04 MED ORDER — BUPIVACAINE HCL (PF) 0.5 % IJ SOLN
INTRAMUSCULAR | Status: DC | PRN
  Administered 2018-05-04: 2 mL

## 2018-05-04 MED ORDER — PROPOFOL 10 MG/ML IV BOLUS
INTRAVENOUS | Status: DC | PRN
  Administered 2018-05-04: 20 mg via INTRAVENOUS

## 2018-05-04 MED ORDER — ONDANSETRON HCL 4 MG/2ML IJ SOLN
INTRAMUSCULAR | Status: AC
  Filled 2018-05-04: qty 2

## 2018-05-04 MED ORDER — ROCURONIUM BROMIDE 50 MG/5ML IV SOLN
INTRAVENOUS | Status: AC
  Filled 2018-05-04: qty 1

## 2018-05-04 MED ORDER — LIDOCAINE HCL (PF) 2 % IJ SOLN
INTRAMUSCULAR | Status: AC
  Filled 2018-05-04: qty 10

## 2018-05-04 MED ORDER — KETOROLAC TROMETHAMINE 30 MG/ML IJ SOLN
INTRAMUSCULAR | Status: AC
  Filled 2018-05-04: qty 1

## 2018-05-04 MED ORDER — FENTANYL CITRATE (PF) 100 MCG/2ML IJ SOLN
INTRAMUSCULAR | Status: DC | PRN
  Administered 2018-05-04: 30 ug via INTRAVENOUS
  Administered 2018-05-04: 50 ug via INTRAVENOUS

## 2018-05-04 MED ORDER — MIDAZOLAM HCL 2 MG/2ML IJ SOLN
INTRAMUSCULAR | Status: AC
  Filled 2018-05-04: qty 2

## 2018-05-04 SURGICAL SUPPLY — 28 items
BLADE SURG SZ11 CARB STEEL (BLADE) ×3 IMPLANT
CHLORAPREP W/TINT 26ML (MISCELLANEOUS) ×3 IMPLANT
DERMABOND ADVANCED (GAUZE/BANDAGES/DRESSINGS) ×2
DERMABOND ADVANCED .7 DNX12 (GAUZE/BANDAGES/DRESSINGS) ×1 IMPLANT
DRAPE LAPAROTOMY 77X122 PED (DRAPES) ×3 IMPLANT
ELECT CAUTERY BLADE 6.4 (BLADE) ×3 IMPLANT
ELECT REM PT RETURN 9FT ADLT (ELECTROSURGICAL) ×3
ELECTRODE REM PT RTRN 9FT ADLT (ELECTROSURGICAL) ×1 IMPLANT
GLOVE BIO SURGEON STRL SZ7 (GLOVE) ×3 IMPLANT
GLOVE BIOGEL PI IND STRL 7.5 (GLOVE) ×1 IMPLANT
GLOVE BIOGEL PI INDICATOR 7.5 (GLOVE) ×2
GOWN STRL REUS W/ TWL LRG LVL3 (GOWN DISPOSABLE) ×2 IMPLANT
GOWN STRL REUS W/ TWL XL LVL3 (GOWN DISPOSABLE) ×1 IMPLANT
GOWN STRL REUS W/TWL LRG LVL3 (GOWN DISPOSABLE) ×4
GOWN STRL REUS W/TWL XL LVL3 (GOWN DISPOSABLE) ×2
KIT TURNOVER CYSTO (KITS) ×3 IMPLANT
LABEL OR SOLS (LABEL) ×3 IMPLANT
NEEDLE HYPO 22GX1.5 SAFETY (NEEDLE) ×3 IMPLANT
NS IRRIG 500ML POUR BTL (IV SOLUTION) ×3 IMPLANT
PACK BASIN MINOR ARMC (MISCELLANEOUS) ×3 IMPLANT
SUT MNCRL 4-0 (SUTURE) ×2
SUT MNCRL 4-0 27XMFL (SUTURE) ×1
SUT PLAIN GUT 0 (SUTURE) ×6 IMPLANT
SUT VIC AB 2-0 UR6 27 (SUTURE) ×3 IMPLANT
SUT VIC AB 3-0 SH 27 (SUTURE) ×2
SUT VIC AB 3-0 SH 27X BRD (SUTURE) ×1 IMPLANT
SUTURE MNCRL 4-0 27XMF (SUTURE) ×1 IMPLANT
SYR 10ML LL (SYRINGE) ×3 IMPLANT

## 2018-05-04 NOTE — Anesthesia Procedure Notes (Signed)
Performed by: Sakeenah Valcarcel, CRNA Pre-anesthesia Checklist: Patient identified, Emergency Drugs available, Suction available, Patient being monitored and Timeout performed Oxygen Delivery Method: Nasal cannula       

## 2018-05-04 NOTE — Anesthesia Post-op Follow-up Note (Signed)
Anesthesia QCDR form completed.        

## 2018-05-04 NOTE — Progress Notes (Signed)
Patient to OR via bed. Taken by OR tech, Josh.

## 2018-05-04 NOTE — Anesthesia Preprocedure Evaluation (Addendum)
Anesthesia Evaluation  Patient identified by MRN, date of birth, ID band Patient awake    Reviewed: Allergy & Precautions, H&P , NPO status , Patient's Chart, lab work & pertinent test results  Airway Mallampati: III  TM Distance: >3 FB     Dental  (+) Missing, Chipped, Poor Dentition   Pulmonary asthma , Recent URI , Residual Cough, Current Smoker,   Active cough   + wheezing      Cardiovascular Exercise Tolerance: Good + Valvular Problems/Murmurs (murmur)  Rhythm:regular Rate:Normal     Neuro/Psych  Headaches,    GI/Hepatic negative GI ROS,   Endo/Other    Renal/GU   negative genitourinary   Musculoskeletal   Abdominal   Peds  Hematology negative hematology ROS (+)   Anesthesia Other Findings Past Medical History: No date: Abscess and cellulitis No date: Asthma No date: Headache No date: Heart murmur No date: Tobacco use disorder No date: UTI (lower urinary tract infection)  Past Surgical History: No date: NO PAST SURGERIES  BMI    Body Mass Index:  33.20 kg/m      Reproductive/Obstetrics Post partum                            Anesthesia Physical Anesthesia Plan  ASA: III  Anesthesia Plan: Spinal   Post-op Pain Management:    Induction:   PONV Risk Score and Plan: Midazolam  Airway Management Planned:   Additional Equipment:   Intra-op Plan:   Post-operative Plan:   Informed Consent: I have reviewed the patients History and Physical, chart, labs and discussed the procedure including the risks, benefits and alternatives for the proposed anesthesia with the patient or authorized representative who has indicated his/her understanding and acceptance.   Dental Advisory Given  Plan Discussed with: Anesthesiologist  Anesthesia Plan Comments:        Anesthesia Quick Evaluation

## 2018-05-04 NOTE — Progress Notes (Signed)
Obstetric Postpartum Daily Progress Note Subjective:  32 y.o. Z6X0960 postpartum day #1 status post vaginal delivery.  She is ambulating, is tolerating po, is voiding spontaneously.  Her pain is well controlled on PO pain medications. Her lochia is less than menses.  She has been NPO since midnight for surgery today. She still wishes to proceed with her tubal ligation.    Medications SCHEDULED MEDICATIONS  . ferrous sulfate  325 mg Oral BID WC  . prenatal multivitamin  1 tablet Oral Q1200  . senna-docusate  2 tablet Oral Q24H    MEDICATION INFUSIONS  . lactated ringers 125 mL/hr at 05/04/18 0817    PRN MEDICATIONS  acetaminophen, benzocaine-Menthol, coconut oil, witch hazel-glycerin **AND** dibucaine, diphenhydrAMINE, ondansetron **OR** ondansetron (ZOFRAN) IV, simethicone, traMADol    Objective:   Vitals:   05/03/18 0733 05/03/18 1636 05/04/18 0027 05/04/18 0753  BP: 117/71 101/68 97/72 103/69  Pulse: 66 60 71 (!) 58  Resp: 20 18 18 18   Temp: 98.6 F (37 C) 98.1 F (36.7 C) 98.4 F (36.9 C) 98.1 F (36.7 C)  TempSrc: Oral Oral Oral Oral  SpO2: 99%  98% 99%  Weight:      Height:        Current Vital Signs 24h Vital Sign Ranges  T 98.1 F (36.7 C) Temp  Avg: 98.2 F (36.8 C)  Min: 98.1 F (36.7 C)  Max: 98.4 F (36.9 C)  BP 103/69 BP  Min: 97/72  Max: 103/69  HR (!) 58 Pulse  Avg: 63  Min: 58  Max: 71  RR 18 Resp  Avg: 18  Min: 18  Max: 18  SaO2 99 % Room Air SpO2  Avg: 98.5 %  Min: 98 %  Max: 99 %       24 Hour I/O Current Shift I/O  Time Ins Outs 10/04 0701 - 10/05 0700 In: 838.3 [P.O.:480; I.V.:358.3] Out: 450 [Urine:450] No intake/output data recorded.  General: NAD CV: RRR Pulmonary: no increased work of breathing, CTAB Abdomen: non-distended, non-tender, fundus firm at level of umbilicus Extremities: no edema, no erythema, no tenderness  Labs:  Recent Labs  Lab 05/02/18 2008 05/03/18 0656  WBC 15.4* 15.7*  HGB 12.3 11.8*  HCT 34.7* 33.7*  PLT 195  183     Assessment:   32 y.o. A5W0981 postpartum day # 1 status post SVD, desires permanent sterilization  Plan:   1) Acute blood loss anemia - hemodynamically stable and asymptomatic - po ferrous sulfate  2) A NEG / Rubella 30.80 (08/09 1748)/ Varicella Immune  3) TDAP status did not receive  4) formula feeding  5) Contraception = BTL this morning.  32 y.o. [id number]  with undesired fertility, desires permanent sterilization.  Other reversible forms of contraception were discussed with patient; she declines all other modalities. Permanent nature of as well as associated risks of the procedure discussed with patient including but not limited to: risk of regret, permanence of method, bleeding, infection, injury to surrounding organs and need for additional procedures.  Failure risk of 0.5-1% with increased risk of ectopic gestation if pregnancy occurs was also discussed with patient.    6) Disposition: Possibly home later today.  [name], MD 05/04/2018 8:32 AM

## 2018-05-04 NOTE — Anesthesia Procedure Notes (Signed)
Spinal  Patient location during procedure: OR Start time: 05/04/2018 8:57 AM End time: 05/04/2018 9:00 AM Staffing Anesthesiologist: Durenda Hurt, MD Performed: anesthesiologist  Preanesthetic Checklist Completed: patient identified, site marked, surgical consent, pre-op evaluation, timeout performed, IV checked, risks and benefits discussed and monitors and equipment checked Spinal Block Patient position: sitting Prep: ChloraPrep Patient monitoring: heart rate, continuous pulse ox, blood pressure and cardiac monitor Approach: midline Location: L4-5 Injection technique: single-shot Needle Needle type: Whitacre and Introducer  Needle gauge: 24 G Needle length: 9 cm Additional Notes Negative paresthesia. Negative blood return. Positive free-flowing CSF. Expiration date of kit checked and confirmed. Patient tolerated procedure well, without complications.

## 2018-05-04 NOTE — Progress Notes (Signed)
Patient states she has been without her inhalers for approx. 3-4 months now.  Will ask to be refilled Before she discharges home.  No acute distress.

## 2018-05-04 NOTE — Progress Notes (Signed)
Patient reports feeling itchy all over, Dr. Sampson Goon notified, benadryl ordered and given.  No acute Distress.

## 2018-05-04 NOTE — Progress Notes (Signed)
Patient has slight itching still, improved from Before.  No acute distress.

## 2018-05-04 NOTE — Progress Notes (Signed)
Discharge instructions given. Patient verbalizes understanding of teaching. CPR video watched. Patient discharged home via wheelchair at 1755.

## 2018-05-04 NOTE — Op Note (Signed)
Op Note Postpartum Tubal Ligation  Pre-Op Diagnosis: multiparity, desires permanent sterilization  Post-Op Diagnosis: multiparity, desires permanent sterilization  Procedures:  Postpartum tubal ligation via Pomeroy method  Primary Surgeon: [name], MD  EBL: 10 ml   IVF: 700 mL   Specimens: portion of right and left fallopian tubes  Drains: None  Anesthesia: Spinal  Complications: None   Disposition: PACU   Condition: Stable   Findings: normal appearing bilateral fallopian tubes  Indication: The patient is a 32 y.o. [id number] who is postpartum day 1 status post spontaneous vaginal delivery.  She has been counseled extensively regarding risks, benefits, and alternatives to tubal ligation, including non-permanent forms of contraception that are equivalent in efficacy with potentially better side effects.  She has been advised that there is a failure rate of 3-5 in every 1,000 tubal ligations per year with an increased risk of ectopic pregnancy should pregnancy occur.   Procedure Summary:  The patient was taken to the operating room where spinal anesthesia was administered and found to be adequate. After timeout was called a small transverse, infraumbilical incision was made with the scalpel after skin infiltration with 0.5% marcaine plain. The incision was carried down through the fascia until the peritoneum was identified and entered. The peritoneum was noted to be free of any adhesions and the incision was then extended.  The patient's left fallopian tube was identified, brought incision, and grasped with a Babcock clamp. The tube was then followed out to the fimbria. The Babcock clamp was then used to grasp the tube approximately 4 cm from the cornual region. A 3 cm segment of tube was then ligated with the 2 free ties of plain gut, and excised. Good hemostasis was noted and the tube was returned to the abdomen. The right fallopian tube was then ligated, and a 3 cm segment  excised in a similar fashion. Excellent hemostasis was noted, and the tube returned to the abdomen.  The peritoneum and fascia were closed in a single layer using 2-0 Vicryl. The skin was closed in a subcuticular fashion using 4-0 monocryl. The closure was also closed with Dermabond.  The patient tolerated the procedure well.  Sponge, lap, needle, and instrument counts were correct x 2.  VTE prophylaxis: SCDs. Antibiotic prophylaxis: none indicated. The patient tolerated the procedure well and was taken to the PACU in stable condition.   [name], MD 05/04/2018 9:53 AM

## 2018-05-04 NOTE — Transfer of Care (Signed)
Immediate Anesthesia Transfer of Care Note  Patient: Eileen Tate  Procedure(s) Performed: POST PARTUM TUBAL LIGATION (N/A )  Patient Location: PACU  Anesthesia Type:General  Level of Consciousness: awake, alert  and oriented  Airway & Oxygen Therapy: Patient Spontanous Breathing  Post-op Assessment: Report given to RN and Post -op Vital signs reviewed and stable  Post vital signs: Reviewed and stable  Last Vitals:  Vitals Value Taken Time  BP 97/43 05/04/2018  9:51 AM  Temp    Pulse 97 05/04/2018  9:52 AM  Resp 14 05/04/2018  9:52 AM  SpO2 97 % 05/04/2018  9:52 AM  Vitals shown include unvalidated device data.  Last Pain:  Vitals:   05/04/18 0753  TempSrc: Oral  PainSc:          Complications: No apparent anesthesia complications

## 2018-05-04 NOTE — Progress Notes (Signed)
15 minute call to floor. 

## 2018-05-06 ENCOUNTER — Encounter: Payer: Self-pay | Admitting: Obstetrics and Gynecology

## 2018-05-06 ENCOUNTER — Other Ambulatory Visit: Payer: Self-pay | Admitting: Obstetrics & Gynecology

## 2018-05-06 ENCOUNTER — Telehealth: Payer: Self-pay

## 2018-05-06 MED ORDER — OXYCODONE-ACETAMINOPHEN 5-325 MG PO TABS: 1.0000 | ORAL_TABLET | ORAL | 0 refills | Status: DC | PRN

## 2018-05-06 NOTE — Anesthesia Postprocedure Evaluation (Signed)
Anesthesia Post Note  Patient: Eileen Tate  Procedure(s) Performed: POST PARTUM TUBAL LIGATION (N/A )  Anesthesia Type: Spinal     Last Vitals:  Vitals:   05/04/18 1147 05/04/18 1233  BP: 99/64 102/60  Pulse: (!) 55 63  Resp: 18 18  Temp: 36.8 C 36.7 C  SpO2: 100% 100%    Last Pain:  Vitals:   05/04/18 1505  TempSrc:   PainSc: 4    Pt discharged postop prior to being seen.  No issues related to spinal per staff.              Jovita Gamma

## 2018-05-06 NOTE — Telephone Encounter (Signed)
Pt aware.

## 2018-05-06 NOTE — Telephone Encounter (Signed)
Pt states she was given Tylenol & Tramadol. She is allergic to Ibuprofen. She wasn't hurting until today & has taken 2 Tramadol in 4 hours & it's not touching the pain. States she was asked if she needed anything else before she left hospital, but she declined as she was doing ok at that time. Pt requesting something other than tramadol.

## 2018-05-06 NOTE — Telephone Encounter (Signed)
Pt had her baby Friday & had her tubes tied. States she is in severe pain. ZO#[phone]

## 2018-05-06 NOTE — Telephone Encounter (Signed)
Will call in Percocet

## 2018-05-07 ENCOUNTER — Encounter: Payer: Medicaid Other | Admitting: Obstetrics and Gynecology

## 2018-05-08 LAB — SURGICAL PATHOLOGY

## 2018-05-14 ENCOUNTER — Telehealth: Payer: Self-pay

## 2018-05-14 NOTE — Telephone Encounter (Signed)
LMVM TRC. 

## 2018-05-14 NOTE — Telephone Encounter (Signed)
Pt had tubes tied 2 wks ago & has a question. RU#[phone]

## 2018-05-16 NOTE — Telephone Encounter (Signed)
Attempted to reach pt again. LVM TRC.

## 2018-05-17 NOTE — Anesthesia Postprocedure Evaluation (Signed)
Anesthesia Post Note  Patient: Eileen Tate  Procedure(s) Performed: AN AD HOC LABOR EPIDURAL  Comments: Pt discharged prior to being seen.     Last Vitals: There were no vitals filed for this visit.  Last Pain: There were no vitals filed for this visit.               KEPHART,WILLIAM K

## 2018-05-20 ENCOUNTER — Ambulatory Visit: Payer: Medicaid Other | Admitting: Obstetrics and Gynecology

## 2018-05-20 ENCOUNTER — Telehealth: Payer: Self-pay

## 2018-05-20 NOTE — Telephone Encounter (Signed)
Please call back.  [phone]

## 2018-05-21 NOTE — Telephone Encounter (Signed)
Left voicemail for patient to call back to be schedule °

## 2018-05-21 NOTE — Telephone Encounter (Signed)
Pt did not show for her post op appt yesterday with SDJ. Please call and help her get this scheduled. She needs to come in for post op.

## 2018-06-24 ENCOUNTER — Encounter: Payer: Self-pay | Admitting: Emergency Medicine

## 2018-06-24 ENCOUNTER — Emergency Department
Admission: EM | Admit: 2018-06-24 | Discharge: 2018-06-24 | Disposition: A | Discharge status: Home / Self Care | Payer: Medicaid Other | Attending: Emergency Medicine | Admitting: Emergency Medicine

## 2018-06-24 ENCOUNTER — Emergency Department: Payer: Medicaid Other

## 2018-06-24 DIAGNOSIS — K529 Noninfective gastroenteritis and colitis, unspecified: Secondary | ICD-10-CM | POA: Insufficient documentation

## 2018-06-24 DIAGNOSIS — J45909 Unspecified asthma, uncomplicated: Secondary | ICD-10-CM | POA: Diagnosis not present

## 2018-06-24 DIAGNOSIS — R1084 Generalized abdominal pain: Secondary | ICD-10-CM | POA: Diagnosis present

## 2018-06-24 DIAGNOSIS — F17210 Nicotine dependence, cigarettes, uncomplicated: Secondary | ICD-10-CM | POA: Insufficient documentation

## 2018-06-24 LAB — COMPREHENSIVE METABOLIC PANEL
ALK PHOS: 73 U/L (ref 38–126)
ALT: 36 U/L (ref 0–44)
AST: 38 U/L (ref 15–41)
Albumin: 3.9 g/dL (ref 3.5–5.0)
Anion gap: 10 (ref 5–15)
BUN: 6 mg/dL (ref 6–20)
CHLORIDE: 103 mmol/L (ref 98–111)
CO2: 26 mmol/L (ref 22–32)
Calcium: 9.3 mg/dL (ref 8.9–10.3)
Creatinine, Ser: 0.83 mg/dL (ref 0.44–1.00)
GFR calc non Af Amer: >60 mL/min (ref >60)
Glucose, Bld: 83 mg/dL (ref 70–99)
Potassium: 3.5 mmol/L (ref 3.5–5.1)
SODIUM: 139 mmol/L (ref 135–145)
TOTAL PROTEIN: 7.1 g/dL (ref 6.5–8.1)
Total Bilirubin: 0.3 mg/dL (ref 0.3–1.2)

## 2018-06-24 LAB — CBC
HEMATOCRIT: 43.6 % (ref 36.0–46.0)
HEMOGLOBIN: 13.9 g/dL (ref 12.0–15.0)
MCH: 30.5 pg (ref 26.0–34.0)
MCHC: 31.9 g/dL (ref 30.0–36.0)
MCV: 95.6 fL (ref 80.0–100.0)
Platelets: 330 10*3/uL (ref 150–400)
RBC: 4.56 MIL/uL (ref 3.87–5.11)
RDW: 15.2 % (ref 11.5–15.5)
WBC: 8.1 10*3/uL (ref 4.0–10.5)
nRBC: 0 % (ref 0.0–0.2)

## 2018-06-24 LAB — URINALYSIS, COMPLETE (UACMP) WITH MICROSCOPIC
Bilirubin Urine: NEGATIVE
Glucose, UA: NEGATIVE mg/dL
Ketones, ur: NEGATIVE mg/dL
Nitrite: NEGATIVE
PROTEIN: NEGATIVE mg/dL
Specific Gravity, Urine: 1.002 — ABNORMAL LOW (ref 1.005–1.030)
pH: 6 (ref 5.0–8.0)

## 2018-06-24 LAB — LIPASE, BLOOD: LIPASE: 31 U/L (ref 11–51)

## 2018-06-24 MED ORDER — ONDANSETRON HCL 4 MG/2ML IJ SOLN
4.0000 mg | Freq: Once | INTRAMUSCULAR | Status: AC
  Administered 2018-06-24: 4 mg via INTRAVENOUS
  Filled 2018-06-24: qty 2

## 2018-06-24 MED ORDER — SODIUM CHLORIDE 0.9 % IV SOLN
Freq: Once | INTRAVENOUS | Status: AC
  Administered 2018-06-24: 10:00:00 via INTRAVENOUS

## 2018-06-24 MED ORDER — TRIAMCINOLONE ACETONIDE 0.5 % EX OINT: 1.0000 "application " | TOPICAL_OINTMENT | Freq: Two times a day (BID) | CUTANEOUS | 1 refills | Status: DC

## 2018-06-24 MED ORDER — LOPERAMIDE HCL 2 MG PO CAPS
4.0000 mg | ORAL_CAPSULE | Freq: Once | ORAL | Status: AC
  Administered 2018-06-24: 4 mg via ORAL
  Filled 2018-06-24: qty 2

## 2018-06-24 MED ORDER — ONDANSETRON 4 MG PO TBDP: 4.0000 mg | ORAL_TABLET | Freq: Three times a day (TID) | ORAL | 0 refills | Status: DC | PRN

## 2018-06-24 MED ORDER — DICYCLOMINE HCL 20 MG PO TABS: 20.0000 mg | ORAL_TABLET | Freq: Three times a day (TID) | ORAL | 0 refills | Status: DC | PRN

## 2018-06-24 NOTE — ED Provider Notes (Signed)
[facility] Emergency Department Provider Note       Time seen: ----------------------------------------- 9:33 AM on 06/24/2018 -----------------------------------------   I have reviewed the triage vital signs and the nursing notes.  HISTORY   Chief Complaint Abdominal Pain    HPI Eileen Tate is a 32 y.o. female with a history of abscess and cellulitis, asthma, headache, UTI who presents to the ED for left-sided abdominal pain that began last night.  Patient reports her Saturday she was up all night with nausea, vomiting and diarrhea.  She has had diarrhea today but has not had any vomiting.  She denies fevers, chills or other complaints.  She is 7 weeks postpartum and reports she recently had her menstrual cycle but is not currently bleeding.  Past Medical History:  Diagnosis Date  . Abscess and cellulitis   . Asthma   . Headache   . Heart murmur   . Tobacco use disorder   . UTI (lower urinary tract infection)     Patient Active Problem List   Diagnosis Date Noted  . Encounter for sterilization 05/04/2018  . Postpartum care following vaginal delivery 05/04/2018  . Preterm premature rupture of membranes 05/02/2018  . Rh negative state in antepartum period 04/30/2018  . Abdominal pain affecting pregnancy, antepartum 04/28/2018  . Indication for care in labor and delivery, antepartum 04/25/2018  . Vaginal discharge 04/22/2018  . Pregnancy 04/22/2018  . Late prenatal care affecting pregnancy, antepartum 04/15/2018  . Grand multiparity with current pregnancy, antepartum 04/15/2018  . Urinary tract infection 03/17/2018  . Supervision of high risk pregnancy, antepartum, third trimester 03/12/2018  . Abdominal pain affecting pregnancy 03/08/2018  . History of asthma 04/27/2015  . LSIL (low grade squamous intraepithelial lesion) on Pap smear 12/04/2012    Past Surgical History:  Procedure Laterality Date  . NO PAST SURGERIES    . TUBAL  LIGATION N/A 05/04/2018   Procedure: POST PARTUM TUBAL LIGATION;  Surgeon: Conard Novak, MD;  Location: [facility] ORS;  Service: Gynecology;  Laterality: N/A;    Allergies Excedrin extra strength [aspirin-acetaminophen-caffeine]; Guaifenesin & derivatives; and Ibuprofen  Social History Social History   Tobacco Use  . Smoking status: Current Every Day Smoker    Packs/day: 0.25    Years: 10.00    Pack years: 2.50    Types: Cigarettes  . Smokeless tobacco: Never Used  Substance Use Topics  . Alcohol use: No    Alcohol/week: 1.0 standard drinks    Types: 1 Cans of beer per week  . Drug use: No   Review of Systems Constitutional: Negative for fever. Cardiovascular: Negative for chest pain. Respiratory: Negative for shortness of breath. Gastrointestinal: Positive for abdominal pain, vomiting and diarrhea Genitourinary: Negative for dysuria. Musculoskeletal: Negative for back pain. Skin: Negative for rash. Neurological: Negative for headaches, focal weakness or numbness.  All systems negative/normal/unremarkable except as stated in the HPI  ____________________________________________   PHYSICAL EXAM:  VITAL SIGNS: ED Triage Vitals [06/24/18 0847]  Enc Vitals Group     BP 122/80     Pulse Rate 79     Resp 16     Temp 98 F (36.7 C)     Temp Source Oral     SpO2 100 %     Weight      Height 5\' 1"  (1.549 m)     Head Circumference      Peak Flow      Pain Score 6     Pain Loc  Pain Edu?      Excl. in GC?    Constitutional: Alert and oriented. Well appearing and in no distress. ENT   Head: Normocephalic and atraumatic.   Nose: No congestion/rhinnorhea.   Mouth/Throat: Mucous membranes are moist.   Neck: No stridor. Cardiovascular: Normal rate, regular rhythm. No murmurs, rubs, or gallops. Respiratory: Normal respiratory effort without tachypnea nor retractions. Breath sounds are clear and equal bilaterally. No  wheezes/rales/rhonchi. Gastrointestinal: Soft and nontender.  Patient points to the left mid abdomen as the location of her pain Musculoskeletal: Nontender with normal range of motion in extremities. No lower extremity tenderness nor edema. Neurologic:  Normal speech and language. No gross focal neurologic deficits are appreciated.  Skin:  Skin is warm, dry and intact. No rash noted.  ____________________________________________  ED COURSE:  As part of my medical decision making, I reviewed the following data within the electronic MEDICAL RECORD NUMBER History obtained from family if available, nursing notes, old chart and ekg, as well as notes from prior ED visits. Patient presented for abdominal pain with recent vomiting and diarrhea, we will assess with labs and imaging as indicated at this time.   Procedures ____________________________________________   LABS (pertinent positives/negatives)  Labs Reviewed  URINALYSIS, COMPLETE (UACMP) WITH MICROSCOPIC - Abnormal; Notable for the following components:      Result Value   Color, Urine STRAW (*)    APPearance HAZY (*)    Specific Gravity, Urine 1.002 (*)    Hgb urine dipstick SMALL (*)    Leukocytes, UA TRACE (*)    Bacteria, UA FEW (*)    All other components within normal limits  LIPASE, BLOOD  COMPREHENSIVE METABOLIC PANEL  CBC  POC URINE PREG, ED    RADIOLOGY Images were viewed by me  Abdomen 2 view IMPRESSION: No acute abnormality noted. ____________________________________________  DIFFERENTIAL DIAGNOSIS   Gastroenteritis, dehydration, electrolyte abnormality, renal colic, UTI  FINAL ASSESSMENT AND PLAN  Gastroenteritis   Plan: The patient had presented for abdominal pain with recent vomiting and diarrhea. Patient's labs were unremarkable. Patient's imaging was also unremarkable.  I think it is likely that she has a viral infection.  She will be discharged with Zofran and Bentyl.  She is cleared for outpatient  follow-up.   Ulice DashJohnathan E , MD   Note: This note was generated in part or whole with voice recognition software. Voice recognition is usually quite accurate but there are transcription errors that can and very often do occur. I apologize for any typographical errors that were not detected and corrected.     Emily Filbert,  E, MD 06/24/18 [id number]

## 2018-06-24 NOTE — ED Triage Notes (Signed)
Patient presents to ED via POV from home with c/o LUQ pain that began last night. Patient is 7 weeks post partum. Patient reports on Saturday she was up all night with N/V/D.

## 2018-06-24 NOTE — ED Notes (Signed)
NEG preg test

## 2018-06-26 LAB — URINE CULTURE
Culture: 30000 — AB
Special Requests: NORMAL

## 2018-12-04 ENCOUNTER — Other Ambulatory Visit: Payer: Self-pay

## 2018-12-04 ENCOUNTER — Ambulatory Visit
Admission: EM | Admit: 2018-12-04 | Discharge: 2018-12-04 | Disposition: A | Discharge status: Home / Self Care | Payer: Medicaid Other | Attending: Family Medicine | Admitting: Family Medicine

## 2018-12-04 ENCOUNTER — Encounter: Payer: Self-pay | Admitting: Emergency Medicine

## 2018-12-04 DIAGNOSIS — F17210 Nicotine dependence, cigarettes, uncomplicated: Secondary | ICD-10-CM | POA: Diagnosis not present

## 2018-12-04 DIAGNOSIS — F410 Panic disorder [episodic paroxysmal anxiety] without agoraphobia: Secondary | ICD-10-CM | POA: Diagnosis present

## 2018-12-04 LAB — COMPREHENSIVE METABOLIC PANEL
ALT: 49 U/L — ABNORMAL HIGH (ref 0–44)
AST: 31 U/L (ref 15–41)
Albumin: 4.7 g/dL (ref 3.5–5.0)
Alkaline Phosphatase: 96 U/L (ref 38–126)
Anion gap: 9 (ref 5–15)
BUN: 10 mg/dL (ref 6–20)
CO2: 25 mmol/L (ref 22–32)
Calcium: 9.3 mg/dL (ref 8.9–10.3)
Chloride: 100 mmol/L (ref 98–111)
Creatinine, Ser: 0.78 mg/dL (ref 0.44–1.00)
GFR calc Af Amer: >60 mL/min (ref >60)
GFR calc non Af Amer: >60 mL/min (ref >60)
Glucose, Bld: 114 mg/dL — ABNORMAL HIGH (ref 70–99)
Potassium: 4.2 mmol/L (ref 3.5–5.1)
Sodium: 134 mmol/L — ABNORMAL LOW (ref 135–145)
Total Bilirubin: 0.5 mg/dL (ref 0.3–1.2)
Total Protein: 8.6 g/dL — ABNORMAL HIGH (ref 6.5–8.1)

## 2018-12-04 LAB — CBC WITH DIFFERENTIAL/PLATELET
Abs Immature Granulocytes: 0.03 10*3/uL (ref 0.00–0.07)
Basophils Absolute: 0.1 10*3/uL (ref 0.0–0.1)
Basophils Relative: 1 %
Eosinophils Absolute: 0.1 10*3/uL (ref 0.0–0.5)
Eosinophils Relative: 1 %
HCT: 48.1 % — ABNORMAL HIGH (ref 36.0–46.0)
Hemoglobin: 16.4 g/dL — ABNORMAL HIGH (ref 12.0–15.0)
Immature Granulocytes: 0 %
Lymphocytes Relative: 19 %
Lymphs Abs: 2 10*3/uL (ref 0.7–4.0)
MCH: 32.8 pg (ref 26.0–34.0)
MCHC: 34.1 g/dL (ref 30.0–36.0)
MCV: 96.2 fL (ref 80.0–100.0)
Monocytes Absolute: 0.6 10*3/uL (ref 0.1–1.0)
Monocytes Relative: 5 %
Neutro Abs: 7.9 10*3/uL — ABNORMAL HIGH (ref 1.7–7.7)
Neutrophils Relative %: 74 %
Platelets: 294 10*3/uL (ref 150–400)
RBC: 5 MIL/uL (ref 3.87–5.11)
RDW: 13.7 % (ref 11.5–15.5)
WBC: 10.7 10*3/uL — ABNORMAL HIGH (ref 4.0–10.5)
nRBC: 0 % (ref 0.0–0.2)

## 2018-12-04 MED ORDER — CLONAZEPAM 0.5 MG PO TABS: 0.5000 mg | ORAL_TABLET | Freq: Two times a day (BID) | ORAL | 0 refills | Status: DC | PRN

## 2018-12-04 NOTE — ED Triage Notes (Signed)
Patient states when she got up this morning her right leg went completely numb, she was shaking and having chest pain. She states they told her it was an anxiety attack and she states this has happened before but says her leg has continued to be "tight."  She does not have a PCP and was told to come here to be evaluated.

## 2018-12-04 NOTE — Discharge Instructions (Signed)
Medication as needed.  Establish with a primary care provider.  Take care  Dr. Adriana Simas

## 2018-12-04 NOTE — ED Provider Notes (Signed)
[facility]    CSN: [id number] Arrival date & time: 12/04/18  1548  History   Chief Complaint Chief Complaint  Patient presents with  . Leg Pain   HPI  33 year old female presents with chest pain, leg pain, shaking.  Patient reports that this morning she developed sudden onset chest pain, "shaking", and bilateral leg pain and muscle twitching.  Patient reports that she feels so poorly that she called EMS.  She was evaluated.  She was not transported to the hospital.  This was thought to be secondary to anxiety.  Patient states that her chest pain and "shaking" has now resolved.  However, she continues to have pain in her lower extremities.  She reports proximal thigh pain.  She describes it as a tightness.  Right greater than left.  She states that her muscles have been twitching in this region.  No fever.  No respiratory symptoms.  No reports of shortness of breath.  Patient reports that she has had similar bouts of this previously and she was evaluated at the hospital.  She was told that this was anxiety.  Denies nausea or vomiting.  She states that she is staying adequately hydrated.  Denies weight loss.  Patient reports that she is under a great deal of stress.  She has 7 children.  She is troubled by the fact that her husband is out of work and she is struggling with homeschooling her children in the setting of COVID-19.  She believes that this may be an inciting factor.  No other reported symptoms.  No other complaints.  PMH, Surgical Hx, Family Hx, Social History reviewed and updated as below.  Past Medical History:  Diagnosis Date  . Abscess and cellulitis   . Asthma   . Headache   . Heart murmur   . Tobacco use disorder   . UTI (lower urinary tract infection)   Hx of anxiety  Patient Active Problem List   Diagnosis Date Noted  . Encounter for sterilization 05/04/2018  . Postpartum care following vaginal delivery 05/04/2018  . Preterm premature rupture of  membranes 05/02/2018  . Rh negative state in antepartum period 04/30/2018  . Abdominal pain affecting pregnancy, antepartum 04/28/2018  . Indication for care in labor and delivery, antepartum 04/25/2018  . Vaginal discharge 04/22/2018  . Pregnancy 04/22/2018  . Late prenatal care affecting pregnancy, antepartum 04/15/2018  . Grand multiparity with current pregnancy, antepartum 04/15/2018  . Urinary tract infection 03/17/2018  . Supervision of high risk pregnancy, antepartum, third trimester 03/12/2018  . Abdominal pain affecting pregnancy 03/08/2018  . History of asthma 04/27/2015  . LSIL (low grade squamous intraepithelial lesion) on Pap smear 12/04/2012    Past Surgical History:  Procedure Laterality Date  . NO PAST SURGERIES    . TUBAL LIGATION N/A 05/04/2018   Procedure: POST PARTUM TUBAL LIGATION;  Surgeon: Conard Novak, MD;  Location: [facility] ORS;  Service: Gynecology;  Laterality: N/A;    OB History    Gravida  7   Para  7   Term  6   Preterm  1   AB  0   Living  7     SAB  0   TAB  0   Ectopic  0   Multiple  0   Live Births  5            Home Medications    Prior to Admission medications   Medication Sig Start Date End Date Taking?  Authorizing Provider  albuterol (PROVENTIL HFA;VENTOLIN HFA) 108 (90 Base) MCG/ACT inhaler Inhale 2 puffs into the lungs every 6 (six) hours as needed for wheezing or shortness of breath. 05/04/18  Yes Tresea Mall, CNM  clonazePAM (KLONOPIN) 0.5 MG tablet Take 1 tablet (0.5 mg total) by mouth 2 (two) times daily as needed for anxiety. 12/04/18   Tommie Sams, DO    Family History Family History  Problem Relation Age of Onset  . Cancer Maternal Aunt 19       breast  . Cancer Paternal Aunt 82       breast  . Diabetes Paternal Grandmother   . Heart disease Paternal Grandmother     Social History Social History   Tobacco Use  . Smoking status: Current Every Day Smoker    Packs/day: 0.25    Years: 10.00     Pack years: 2.50    Types: Cigarettes  . Smokeless tobacco: Never Used  Substance Use Topics  . Alcohol use: No    Alcohol/week: 1.0 standard drinks    Types: 1 Cans of beer per week  . Drug use: No     Allergies   Excedrin extra strength [aspirin-acetaminophen-caffeine]; Guaifenesin & derivatives; and Ibuprofen   Review of Systems Review of Systems  Cardiovascular: Positive for chest pain.  Musculoskeletal:       Leg pain.  Neurological:       "shaking"   Physical Exam Triage Vital Signs ED Triage Vitals [12/04/18 1600]  Enc Vitals Group     BP (!) 133/100     Pulse Rate 82     Resp 18     Temp 98.5 F (36.9 C)     Temp Source Oral     SpO2 100 %     Weight 167 lb 3.2 oz (75.8 kg)     Height      Head Circumference      Peak Flow      Pain Score 6     Pain Loc      Pain Edu?      Excl. in GC?    Updated Vital Signs BP (!) 133/100 (BP Location: Right Arm)   Pulse 82   Temp 98.5 F (36.9 C) (Oral)   Resp 18   Wt 75.8 kg   LMP 11/27/2018 (Approximate)   SpO2 100%   Breastfeeding No   BMI 31.59 kg/m     Physical Exam Vitals signs and nursing note reviewed.  Constitutional:      General: She is not in acute distress.    Appearance: Normal appearance.  HENT:     Head: Normocephalic and atraumatic.     Mouth/Throat:     Comments: Poor dentition. Eyes:     General:        Right eye: No discharge.        Left eye: No discharge.     Conjunctiva/sclera: Conjunctivae normal.  Cardiovascular:     Rate and Rhythm: Normal rate and regular rhythm.  Pulmonary:     Effort: Pulmonary effort is normal.     Breath sounds: Normal breath sounds.  Musculoskeletal:     Comments: No tenderness of the proximal thighs.  No fasciculations or muscle spasm apparent.  Neurological:     Mental Status: She is alert.  Psychiatric:        Mood and Affect: Mood normal.        Behavior: Behavior normal.    UC Treatments / Results  Labs (all labs ordered are listed,  but only abnormal results are displayed) Labs Reviewed  CBC WITH DIFFERENTIAL/PLATELET - Abnormal; Notable for the following components:      Result Value   WBC 10.7 (*)    Hemoglobin 16.4 (*)    HCT 48.1 (*)    Neutro Abs 7.9 (*)    All other components within normal limits  COMPREHENSIVE METABOLIC PANEL - Abnormal; Notable for the following components:   Sodium 134 (*)    Glucose, Bld 114 (*)    Total Protein 8.6 (*)    ALT 49 (*)    All other components within normal limits    EKG None  Radiology No results found.  Procedures Procedures (including critical care time)  Medications Ordered in UC Medications - No data to display  Initial Impression / Assessment and Plan / UC Course  I have reviewed the triage vital signs and the nursing notes.  Pertinent labs & imaging results that were available during my care of the patient were reviewed by me and considered in my medical decision making (see chart for details).    33 year old female presents with anxiety/panic attack.  Physical exam unremarkable.  Has a history of anxiety.  Klonopin as needed.  Supportive care.    Final Clinical Impressions(s) / UC Diagnoses   Final diagnoses:  Panic attack     Discharge Instructions     Medication as needed.  Establish with a primary care provider.  Take care  Dr. Adriana Simasook     ED Prescriptions    Medication Sig Dispense Auth. Provider   clonazePAM (KLONOPIN) 0.5 MG tablet Take 1 tablet (0.5 mg total) by mouth 2 (two) times daily as needed for anxiety. 30 tablet Tommie Samsook, Topher Buenaventura G, DO     Controlled Substance Prescriptions [place] Controlled Substance Registry consulted? Yes, I have consulted the [place] Controlled Substances Registry for this patient, and feel the risk/benefit ratio today is favorable for proceeding with this prescription for a controlled substance.   Tommie SamsCook, Federico Maiorino G, [place] 12/04/18 1718

## 2019-01-29 ENCOUNTER — Emergency Department: Payer: Medicaid Other

## 2019-01-29 ENCOUNTER — Other Ambulatory Visit: Payer: Self-pay

## 2019-01-29 ENCOUNTER — Encounter: Payer: Self-pay | Admitting: *Deleted

## 2019-01-29 ENCOUNTER — Emergency Department
Admission: EM | Admit: 2019-01-29 | Discharge: 2019-01-29 | Disposition: A | Discharge status: Home / Self Care | Payer: Medicaid Other | Attending: Emergency Medicine | Admitting: Emergency Medicine

## 2019-01-29 DIAGNOSIS — J45909 Unspecified asthma, uncomplicated: Secondary | ICD-10-CM | POA: Diagnosis not present

## 2019-01-29 DIAGNOSIS — F10929 Alcohol use, unspecified with intoxication, unspecified: Secondary | ICD-10-CM | POA: Diagnosis not present

## 2019-01-29 DIAGNOSIS — K029 Dental caries, unspecified: Secondary | ICD-10-CM | POA: Insufficient documentation

## 2019-01-29 DIAGNOSIS — F410 Panic disorder [episodic paroxysmal anxiety] without agoraphobia: Secondary | ICD-10-CM | POA: Diagnosis not present

## 2019-01-29 DIAGNOSIS — F17210 Nicotine dependence, cigarettes, uncomplicated: Secondary | ICD-10-CM | POA: Insufficient documentation

## 2019-01-29 DIAGNOSIS — Y908 Blood alcohol level of 240 mg/100 ml or more: Secondary | ICD-10-CM | POA: Insufficient documentation

## 2019-01-29 DIAGNOSIS — K0889 Other specified disorders of teeth and supporting structures: Secondary | ICD-10-CM | POA: Diagnosis present

## 2019-01-29 LAB — COMPREHENSIVE METABOLIC PANEL
ALT: 32 U/L (ref 0–44)
AST: 31 U/L (ref 15–41)
Albumin: 4.2 g/dL (ref 3.5–5.0)
Alkaline Phosphatase: 95 U/L (ref 38–126)
Anion gap: 11 (ref 5–15)
BUN: 6 mg/dL (ref 6–20)
CO2: 23 mmol/L (ref 22–32)
Calcium: 8.9 mg/dL (ref 8.9–10.3)
Chloride: 103 mmol/L (ref 98–111)
Creatinine, Ser: 0.8 mg/dL (ref 0.44–1.00)
GFR calc Af Amer: >60 mL/min (ref >60)
GFR calc non Af Amer: >60 mL/min (ref >60)
Glucose, Bld: 94 mg/dL (ref 70–99)
Potassium: 3.9 mmol/L (ref 3.5–5.1)
Sodium: 137 mmol/L (ref 135–145)
Total Bilirubin: 0.4 mg/dL (ref 0.3–1.2)
Total Protein: 7.6 g/dL (ref 6.5–8.1)

## 2019-01-29 LAB — CBC
HCT: 48.8 % — ABNORMAL HIGH (ref 36.0–46.0)
Hemoglobin: 16.3 g/dL — ABNORMAL HIGH (ref 12.0–15.0)
MCH: 31.6 pg (ref 26.0–34.0)
MCHC: 33.4 g/dL (ref 30.0–36.0)
MCV: 94.6 fL (ref 80.0–100.0)
Platelets: 261 10*3/uL (ref 150–400)
RBC: 5.16 MIL/uL — ABNORMAL HIGH (ref 3.87–5.11)
RDW: 13.3 % (ref 11.5–15.5)
WBC: 9.3 10*3/uL (ref 4.0–10.5)
nRBC: 0 % (ref 0.0–0.2)

## 2019-01-29 LAB — ETHANOL: Alcohol, Ethyl (B): 262 mg/dL — ABNORMAL HIGH (ref <10)

## 2019-01-29 MED ORDER — AMOXICILLIN-POT CLAVULANATE 875-125 MG PO TABS: 1.0000 | ORAL_TABLET | Freq: Two times a day (BID) | ORAL | 0 refills | Status: AC

## 2019-01-29 MED ORDER — IOHEXOL 300 MG/ML  SOLN
75.0000 mL | Freq: Once | INTRAMUSCULAR | Status: AC | PRN
  Administered 2019-01-29: 05:00:00 75 mL via INTRAVENOUS

## 2019-01-29 NOTE — ED Notes (Signed)
ED Provider at bedside. 

## 2019-01-29 NOTE — ED Provider Notes (Signed)
[facility] Emergency Department Provider Note   First MD Initiated Contact with Patient 01/29/19 [phone]     (approximate)  I have reviewed the triage vital signs and the nursing notes.   HISTORY  Chief Complaint Abscess   HPI Eileen Tate is a 33 y.o. female presents to the emergency department via EMS with multiple medical complaints.  Patient states she has multiple dental caries 1 of her maxillary premolars broke off yesterday with resultant pain and facial swelling.  Patient states that she also has anxiety and that the Klonopin that she "prescribed last month or not working".  Patient also admits to sensation that her throat has swelling tingling and numbness around her mouth and face.  Patient does admit to working 2 beers tonight".  Patient states that her current facial discomfort/tooth ache is 8 out of 10.  Patient denies any fever.        Past Medical History:  Diagnosis Date  . Abscess and cellulitis   . Asthma   . Headache   . Heart murmur   . Tobacco use disorder   . UTI (lower urinary tract infection)     Patient Active Problem List   Diagnosis Date Noted  . Encounter for sterilization 05/04/2018  . Postpartum care following vaginal delivery 05/04/2018  . Preterm premature rupture of membranes 05/02/2018  . Rh negative state in antepartum period 04/30/2018  . Abdominal pain affecting pregnancy, antepartum 04/28/2018  . Indication for care in labor and delivery, antepartum 04/25/2018  . Vaginal discharge 04/22/2018  . Pregnancy 04/22/2018  . Late prenatal care affecting pregnancy, antepartum 04/15/2018  . Grand multiparity with current pregnancy, antepartum 04/15/2018  . Urinary tract infection 03/17/2018  . Supervision of high risk pregnancy, antepartum, third trimester 03/12/2018  . Abdominal pain affecting pregnancy 03/08/2018  . History of asthma 04/27/2015  . LSIL (low grade squamous intraepithelial lesion) on Pap smear  12/04/2012    Past Surgical History:  Procedure Laterality Date  . NO PAST SURGERIES    . TUBAL LIGATION N/A 05/04/2018   Procedure: POST PARTUM TUBAL LIGATION;  Surgeon: Conard NovakJackson, Stephen D, MD;  Location: [facility] ORS;  Service: Gynecology;  Laterality: N/A;    Prior to Admission medications   Medication Sig Start Date End Date Taking? Authorizing Provider  albuterol (PROVENTIL HFA;VENTOLIN HFA) 108 (90 Base) MCG/ACT inhaler Inhale 2 puffs into the lungs every 6 (six) hours as needed for wheezing or shortness of breath. 05/04/18   Tresea MallGledhill, Jane, CNM  clonazePAM (KLONOPIN) 0.5 MG tablet Take 1 tablet (0.5 mg total) by mouth 2 (two) times daily as needed for anxiety. 12/04/18   Tommie Samsook, Jayce G, DO    Allergies Excedrin extra strength [aspirin-acetaminophen-caffeine], Guaifenesin & derivatives, and Ibuprofen  Family History  Problem Relation Age of Onset  . Cancer Maternal Aunt 19       breast  . Cancer Paternal Aunt [id number]       breast  . Diabetes Paternal Grandmother   . Heart disease Paternal Grandmother     Social History Social History   Tobacco Use  . Smoking status: Current Every Day Smoker    Packs/day: 0.25    Years: 10.00    Pack years: 2.50    Types: Cigarettes  . Smokeless tobacco: Never Used  Substance Use Topics  . Alcohol use: No    Alcohol/week: 1.0 standard drinks    Types: 1 Cans of beer per week  . Drug use: No  Review of Systems Constitutional: No fever/chills Eyes: No visual changes. ENT: No sore throat.  Positive for dental pain and left facial swelling Cardiovascular: Denies chest pain. Respiratory: Denies shortness of breath. Gastrointestinal: No abdominal pain.  No nausea, no vomiting.  No diarrhea.  No constipation. Genitourinary: Negative for dysuria. Musculoskeletal: Negative for neck pain.  Negative for back pain. Integumentary: Negative for rash. Neurological: Negative for headaches, focal weakness or numbness. Psychiatric: Positive for  feelings of anxiety    ____________________________________________   PHYSICAL EXAM:  VITAL SIGNS: ED Triage Vitals  Enc Vitals Group     BP 01/29/19 0258 (!) 148/68     Pulse Rate 01/29/19 0258 82     Resp 01/29/19 0258 15     Temp 01/29/19 0258 98 F (36.7 C)     Temp Source 01/29/19 0258 Oral     SpO2 01/29/19 0258 98 %     Weight 01/29/19 0259 72.6 kg (160 lb)     Height 01/29/19 0259 1.524 m (5')     Head Circumference --      Peak Flow --      Pain Score 01/29/19 0258 8     Pain Loc --      Pain Edu? --      Excl. in GC? --    Constitutional: Alert and oriented.  Appears intoxicated Eyes: Conjunctivae are normal.  Mouth/Throat: Mucous membranes are moist.  Multiple dental caries.  Gum swelling in the area of discomfort.  No trismus no evidence of Ludwig's angina  neck: No stridor.   Cardiovascular: Normal rate, regular rhythm. Good peripheral circulation. Grossly normal heart sounds. Respiratory: Normal respiratory effort.  No retractions. No audible wheezing. Gastrointestinal: Soft and nontender. No distention.  Musculoskeletal: No lower extremity tenderness nor edema. No gross deformities of extremities. Neurologic:  Normal speech and language. No gross focal neurologic deficits are appreciated.  Skin:  Skin is warm, dry and intact. No rash noted. Psychiatric: Appears intoxicated, anxious affect with hyperventilation speech and behavior are normal.  ____________________________________________   LABS (all labs ordered are listed, but only abnormal results are displayed)  Labs Reviewed  CBC - Abnormal; Notable for the following components:      Result Value   RBC 5.16 (*)    Hemoglobin 16.3 (*)    HCT 48.8 (*)    All other components within normal limits  ETHANOL - Abnormal; Notable for the following components:   Alcohol, Ethyl (B) 262 (*)    All other components within normal limits  COMPREHENSIVE METABOLIC PANEL   ____________________________   RADIOLOGY I, [place] Kassius Battiste, personally viewed and evaluated these images (plain radiographs) as part of my medical decision making, as well as reviewing the written report by the radiologist.  ED MD interpretation: CT maxillofacial revealed dental caries without any evidence of soft tissue infection or fluid collection. Official radiology report(s): No results found.   Procedures   ____________________________________________   INITIAL IMPRESSION / MDM / ASSESSMENT AND PLAN / ED COURSE  As part of my medical decision making, I reviewed the following data within the electronic MEDICAL RECORD NUMBER 33 year old female presenting with above-stated history and physical exam secondary to above-mentioned complaints.  Patient appears intoxicated EtOH level 262.  Patient does have multiple dental caries.  Concern for possible dental abscess and as such CT maxillofacial was performed which revealed no evidence of soft tissue infection or fluid collection.  Patient had an episode in the emergency department where she began to hyperventilate  however I was able to speak to the patient and she slowed her respiratory rate to a normal rate.  Patient will be prescribed Augmentin for home.  Patient will be referred to outpatient psychiatry for further outpatient evaluation.   *Eileen Tate was evaluated in Emergency Department on 01/29/2019 for the symptoms described in the history of present illness. She was evaluated in the context of the global COVID-19 pandemic, which necessitated consideration that the patient might be at risk for infection with the SARS-CoV-2 virus that causes COVID-19. Institutional protocols and algorithms that pertain to the evaluation of patients at risk for COVID-19 are in a state of rapid change based on information released by regulatory bodies including the CDC and federal and state organizations. These policies and algorithms were followed during the patient's care in the ED.   Some ED evaluations and interventions may be delayed as a result of limited staffing during the pandemic.*        ____________________________________________  FINAL CLINICAL IMPRESSION(S) / ED DIAGNOSES  Final diagnoses:  Dental caries  Alcoholic intoxication with complication (Joseph)  Panic attack     MEDICATIONS GIVEN DURING THIS VISIT:  Medications  iohexol (OMNIPAQUE) 300 MG/ML solution 75 mL (75 mLs Intravenous Contrast Given 01/29/19 0439)     ED Discharge Orders    None       Note:  This document was prepared using Dragon voice recognition software and may include unintentional dictation errors.   Gregor Hams, MD 01/29/19 2233

## 2019-01-29 NOTE — ED Notes (Signed)
Pt anxious and flailing about on stretcher on return from CT

## 2019-01-29 NOTE — ED Triage Notes (Addendum)
Pt states she has many bad teeth and one broke off yesterday and one today. She also has anxiety which she takes klonopin for. Pt is anxious and states her legs are shaking. Also states she has a seizure disorder. Pt states she called 911 because her throat was swelling.

## 2019-02-24 ENCOUNTER — Other Ambulatory Visit: Payer: Self-pay

## 2019-02-24 ENCOUNTER — Ambulatory Visit
Admission: EM | Admit: 2019-02-24 | Discharge: 2019-02-24 | Disposition: A | Discharge status: Home / Self Care | Payer: Medicaid Other | Attending: Emergency Medicine | Admitting: Emergency Medicine

## 2019-02-24 ENCOUNTER — Ambulatory Visit: Payer: Medicaid Other

## 2019-02-24 ENCOUNTER — Encounter: Payer: Self-pay | Admitting: Emergency Medicine

## 2019-02-24 DIAGNOSIS — M25511 Pain in right shoulder: Secondary | ICD-10-CM | POA: Diagnosis not present

## 2019-02-24 DIAGNOSIS — M25562 Pain in left knee: Secondary | ICD-10-CM | POA: Insufficient documentation

## 2019-02-24 DIAGNOSIS — W19XXXS Unspecified fall, sequela: Secondary | ICD-10-CM | POA: Diagnosis not present

## 2019-02-24 DIAGNOSIS — F17210 Nicotine dependence, cigarettes, uncomplicated: Secondary | ICD-10-CM | POA: Diagnosis not present

## 2019-02-24 DIAGNOSIS — Z79899 Other long term (current) drug therapy: Secondary | ICD-10-CM | POA: Diagnosis not present

## 2019-02-24 DIAGNOSIS — W19XXXA Unspecified fall, initial encounter: Secondary | ICD-10-CM | POA: Diagnosis not present

## 2019-02-24 DIAGNOSIS — F419 Anxiety disorder, unspecified: Secondary | ICD-10-CM | POA: Diagnosis not present

## 2019-02-24 DIAGNOSIS — Z3202 Encounter for pregnancy test, result negative: Secondary | ICD-10-CM | POA: Insufficient documentation

## 2019-02-24 DIAGNOSIS — J45909 Unspecified asthma, uncomplicated: Secondary | ICD-10-CM | POA: Diagnosis not present

## 2019-02-24 LAB — PREGNANCY, URINE: Preg Test, Ur: NEGATIVE

## 2019-02-24 MED ORDER — PREDNISONE 10 MG (21) PO TBPK: ORAL_TABLET | ORAL | 1 refills | Status: DC

## 2019-02-24 NOTE — ED Provider Notes (Signed)
[facility]    CSN: [id number] Arrival date & time: 02/24/19  1441      History   Chief Complaint Chief Complaint  Patient presents with  . Anxiety    HPI Eileen Tate is a 33 y.o. female presenting with pain to left knee and right shoulder after a fall on Saturday. Pt states the pain in left knee prohibits her from sleeping is she doesn't have a pillow under her knee. Right shoulder pain is a mild discomfort at rest, but pain increases with movement. Pt doesn't notice a change of pain with tylenol.  Pt also mentions an increased amount of stress. Pt lost 2 family members in the past 3 weeks. One family member would have had a birthday tomorrow and pt states that she has been thinking about that the entire day.   Past Medical History:  Diagnosis Date  . Abscess and cellulitis   . Asthma   . Headache   . Heart murmur   . Tobacco use disorder   . UTI (lower urinary tract infection)     Patient Active Problem List   Diagnosis Date Noted  . Encounter for sterilization 05/04/2018  . Postpartum care following vaginal delivery 05/04/2018  . Preterm premature rupture of membranes 05/02/2018  . Rh negative state in antepartum period 04/30/2018  . Abdominal pain affecting pregnancy, antepartum 04/28/2018  . Indication for care in labor and delivery, antepartum 04/25/2018  . Vaginal discharge 04/22/2018  . Pregnancy 04/22/2018  . Late prenatal care affecting pregnancy, antepartum 04/15/2018  . Grand multiparity with current pregnancy, antepartum 04/15/2018  . Urinary tract infection 03/17/2018  . Supervision of high risk pregnancy, antepartum, third trimester 03/12/2018  . Abdominal pain affecting pregnancy 03/08/2018  . History of asthma 04/27/2015  . LSIL (low grade squamous intraepithelial lesion) on Pap smear 12/04/2012    Past Surgical History:  Procedure Laterality Date  . NO PAST SURGERIES    . TUBAL LIGATION N/A 05/04/2018   Procedure: POST PARTUM  TUBAL LIGATION;  Surgeon: Conard NovakJackson, Stephen D, MD;  Location: [facility] ORS;  Service: Gynecology;  Laterality: N/A;    OB History    Gravida  7   Para  7   Term  6   Preterm  1   AB  0   Living  7     SAB  0   TAB  0   Ectopic  0   Multiple  0   Live Births  5            Home Medications    Prior to Admission medications   Medication Sig Start Date End Date Taking? Authorizing Provider  albuterol (PROVENTIL HFA;VENTOLIN HFA) 108 (90 Base) MCG/ACT inhaler Inhale 2 puffs into the lungs every 6 (six) hours as needed for wheezing or shortness of breath. 05/04/18  Yes Tresea MallGledhill, Jane, CNM  clonazePAM (KLONOPIN) 0.5 MG tablet Take 1 tablet (0.5 mg total) by mouth 2 (two) times daily as needed for anxiety. 12/04/18  Yes Cook, Jayce G, DO  predniSONE (STERAPRED UNI-PAK 21 TAB) 10 MG (21) TBPK tablet Take as instructed on package (60, 50, 40, 30, 20, 10) 02/24/19   Bailey MechBenjamin, Jai Steil, NP    Family History Family History  Problem Relation Age of Onset  . Cancer Maternal Aunt 19       breast  . Cancer Paternal Aunt 247       breast  . Diabetes Paternal Grandmother   . Heart disease Paternal Grandmother  Social History Social History   Tobacco Use  . Smoking status: Current Every Day Smoker    Packs/day: 0.25    Years: 10.00    Pack years: 2.50    Types: Cigarettes  . Smokeless tobacco: Never Used  Substance Use Topics  . Alcohol use: No    Alcohol/week: 1.0 standard drinks    Types: 1 Cans of beer per week  . Drug use: No     Allergies   Excedrin extra strength [aspirin-acetaminophen-caffeine], Guaifenesin & derivatives, and Ibuprofen   Review of Systems Review of Systems  Musculoskeletal: Positive for arthralgias, joint swelling and myalgias.  Psychiatric/Behavioral: Negative for hallucinations, sleep disturbance and suicidal ideas.     Physical Exam Triage Vital Signs ED Triage Vitals [02/24/19 1508]  Enc Vitals Group     BP 120/70     Pulse Rate 84      Resp 18     Temp 98.9 F (37.2 C)     Temp Source Oral     SpO2 100 %     Weight 150 lb (68 kg)     Height 5' (1.524 m)     Head Circumference      Peak Flow      Pain Score 6     Pain Loc      Pain Edu?      Excl. in GC?    No data found.  Updated Vital Signs BP 120/70 (BP Location: Right Arm)   Pulse 84   Temp 98.9 F (37.2 C) (Oral)   Resp 18   Ht 5' (1.524 m)   Wt 150 lb (68 kg)   LMP 01/18/2019 (Approximate)   SpO2 100%   Breastfeeding No   BMI 29.29 kg/m   Visual Acuity Right Eye Distance:   Left Eye Distance:   Bilateral Distance:    Right Eye Near:   Left Eye Near:    Bilateral Near:     Physical Exam Musculoskeletal:     Right shoulder: She exhibits tenderness.     Left knee: She exhibits decreased range of motion. She exhibits no swelling and no erythema. No tenderness found.      UC Treatments / Results  Labs (all labs ordered are listed, but only abnormal results are displayed) Labs Reviewed  PREGNANCY, URINE    EKG   Radiology Dg Shoulder Right  Result Date: 02/24/2019 CLINICAL DATA:  Fall 3 days ago, shoulder pain. EXAM: RIGHT SHOULDER - 2+ VIEW COMPARISON:  None. FINDINGS: Osseous alignment is normal. No fracture line or displaced fracture fragment seen. Soft tissues about the RIGHT shoulder are unremarkable. IMPRESSION: Negative. Electronically Signed   By: Bary RichardStan  Maynard M.D.   On: 02/24/2019 16:36   Dg Knee Complete 4 Views Left  Result Date: 02/24/2019 CLINICAL DATA:  Anterior knee pain since falling 3 days ago. EXAM: LEFT KNEE - COMPLETE 4+ VIEW COMPARISON:  Radiographs 09/14/2013. FINDINGS: The mineralization and alignment are normal. There is no evidence of acute fracture or dislocation. The joint spaces are preserved. No significant joint effusion or focal soft tissue swelling identified. IMPRESSION: Stable examination.  No acute osseous findings. Electronically Signed   By: Carey BullocksWilliam  Veazey M.D.   On: 02/24/2019 16:36     Procedures Procedures (including critical care time)  Medications Ordered in UC Medications - No data to display  Initial Impression / Assessment and Plan / UC Course  I have reviewed the triage vital signs and the nursing notes.  Pertinent labs &  imaging results that were available during my care of the patient were reviewed by me and considered in my medical decision making (see chart for details).     Pt presents with left knee and right shoulder pain, diagnosed with the same and treated as such with the prescribed medications below. Pt instructed to use OTC topical treatments, rote heat and ice therapy as needed.   Pt also encouraged to follow-up with PCP for further management of anxiety. Therapy strongly suggested. All questions answered and all concerns addressed.   Final Clinical Impressions(s) / UC Diagnoses   Final diagnoses:  Fall, sequela  Acute pain of left knee  Acute pain of right shoulder   Discharge Instructions   None    ED Prescriptions    Medication Sig Dispense Auth. Provider   predniSONE (STERAPRED UNI-PAK 21 TAB) 10 MG (21) TBPK tablet Take as instructed on package (60, 50, 40, 30, 20, 10) 1 each Gertie Baron, NP     Controlled Substance Prescriptions [place] Controlled Substance Registry consulted? Not Applicable    Gertie Baron, DNP, NP-c    Gertie Baron, NP 02/24/19 1735

## 2019-02-24 NOTE — ED Triage Notes (Addendum)
Patient c/o anxiety attacks and seizures. She states today she had an episode and she is unsure if she had an anxiety attack or a seizure. She reports that she started shaking and her arms and legs go numb.  Patient also requesting a pregnancy test as she has not had a period in over 1 month.

## 2019-03-10 ENCOUNTER — Telehealth: Payer: Self-pay | Admitting: Obstetrics and Gynecology

## 2019-03-10 ENCOUNTER — Other Ambulatory Visit: Payer: Self-pay | Admitting: Obstetrics and Gynecology

## 2019-03-10 DIAGNOSIS — N926 Irregular menstruation, unspecified: Secondary | ICD-10-CM

## 2019-03-10 NOTE — Telephone Encounter (Signed)
Can bring her in for a blood test.  I can call with results. Don't need visit.

## 2019-03-10 NOTE — Telephone Encounter (Signed)
Patient is calling in today with a complain of missed mentrual cycle for 2 months. Patient reports took pregnancy test two weeks ago results were negative from urgent care. Patient has taken a couple more at home tests that are inclusive. Patient has a hx of tubal ligation with SDJ and is requesting to see him. Next available appointment is a week out. Please advise work in

## 2019-03-10 NOTE — Telephone Encounter (Signed)
Please advise 

## 2019-03-10 NOTE — Telephone Encounter (Signed)
Patient is schedule 03/12/19 for labs appointment

## 2019-03-12 ENCOUNTER — Other Ambulatory Visit: Payer: Medicaid Other

## 2019-03-12 ENCOUNTER — Other Ambulatory Visit: Payer: Self-pay

## 2019-03-12 DIAGNOSIS — N926 Irregular menstruation, unspecified: Secondary | ICD-10-CM

## 2019-03-13 ENCOUNTER — Other Ambulatory Visit: Payer: Self-pay | Admitting: Obstetrics and Gynecology

## 2019-03-13 ENCOUNTER — Telehealth: Payer: Self-pay

## 2019-03-13 DIAGNOSIS — N926 Irregular menstruation, unspecified: Secondary | ICD-10-CM

## 2019-03-13 LAB — BETA HCG QUANT (REF LAB): hCG Quant: <1 m[IU]/mL

## 2019-03-13 MED ORDER — MEDROXYPROGESTERONE ACETATE 10 MG PO TABS: 10.0000 mg | ORAL_TABLET | Freq: Every day | ORAL | 0 refills | Status: DC

## 2019-03-13 NOTE — Telephone Encounter (Signed)
Discussed negative pregnancy test (serum).  She has had no menses since April.  Discussed provera to provoke a menses.  She would like to try this.  I instructed her to give me a call if she did not have a period within 2 weeks of completing the medication.  She also states that she had a blood clot diagnosed 3 weeks ago at [facility].  However, I can find no evidence of this.  She says she is taking no medication to thin her blood.  So what was found exactly is not clear.

## 2019-03-13 NOTE — Telephone Encounter (Signed)
Pt calling requesting to speak with SDJ about lab results and follow up

## 2019-04-25 ENCOUNTER — Emergency Department
Admission: EM | Admit: 2019-04-25 | Discharge: 2019-04-25 | Disposition: A | Discharge status: Home / Self Care | Payer: Medicaid Other | Attending: Emergency Medicine | Admitting: Emergency Medicine

## 2019-04-25 ENCOUNTER — Encounter: Payer: Self-pay | Admitting: Emergency Medicine

## 2019-04-25 ENCOUNTER — Other Ambulatory Visit: Payer: Self-pay

## 2019-04-25 DIAGNOSIS — Z5321 Procedure and treatment not carried out due to patient leaving prior to being seen by health care provider: Secondary | ICD-10-CM | POA: Diagnosis not present

## 2019-04-25 DIAGNOSIS — R21 Rash and other nonspecific skin eruption: Secondary | ICD-10-CM | POA: Insufficient documentation

## 2019-04-25 NOTE — ED Notes (Signed)
Pt came up to use the phone and stated she was leaving.  pt states she is "  leaving and going home and calling a lawyer."  "I am never coming to this  Hospital again.   I am tired of the harrassment.  I haven't even been checked out yet. (Pt was triaged immediately upon arrival)  I came by EMS and put out here (in the lobby).  Pt called for a ride and left the ED.

## 2019-04-25 NOTE — ED Notes (Signed)
Patient up to stat desk to report she's leaving. Patient handed this RN sheet provided to her by EMS drivers. Patient cautioned against dangers of leaving AMA. Patient verbalized understanding of information discussed. Patient expressed dissatisfaction at having to wait in the lobby for a treatment room. Patient reported she was going to go to another emergency department. Patient left.

## 2019-04-25 NOTE — ED Triage Notes (Addendum)
Pt in via [facility] from home, reports rash to abdomen and bilateral upper legs x approximately one year.  Reports itching and pain were severe tonight.  NAD noted at this time.

## 2019-05-21 ENCOUNTER — Other Ambulatory Visit: Payer: Self-pay

## 2019-05-21 ENCOUNTER — Encounter: Payer: Self-pay | Admitting: *Deleted

## 2019-05-21 ENCOUNTER — Emergency Department
Admission: EM | Admit: 2019-05-21 | Discharge: 2019-05-21 | Disposition: A | Discharge status: Home / Self Care | Payer: Medicaid Other | Attending: Emergency Medicine | Admitting: Emergency Medicine

## 2019-05-21 DIAGNOSIS — Z5321 Procedure and treatment not carried out due to patient leaving prior to being seen by health care provider: Secondary | ICD-10-CM | POA: Diagnosis not present

## 2019-05-21 DIAGNOSIS — K0889 Other specified disorders of teeth and supporting structures: Secondary | ICD-10-CM | POA: Insufficient documentation

## 2019-05-21 NOTE — ED Triage Notes (Signed)
Pt states left upper tooth broke off tonight.  Pt has pain on the right side mouth.  Pt alert

## 2019-05-23 ENCOUNTER — Ambulatory Visit
Admission: EM | Admit: 2019-05-23 | Discharge: 2019-05-23 | Disposition: A | Discharge status: Home / Self Care | Payer: Medicaid Other | Attending: Family Medicine | Admitting: Family Medicine

## 2019-05-23 ENCOUNTER — Other Ambulatory Visit: Payer: Self-pay

## 2019-05-23 DIAGNOSIS — K029 Dental caries, unspecified: Secondary | ICD-10-CM | POA: Diagnosis not present

## 2019-05-23 MED ORDER — PENICILLIN V POTASSIUM 500 MG PO TABS: 500.0000 mg | ORAL_TABLET | Freq: Three times a day (TID) | ORAL | 0 refills | Status: DC

## 2019-05-23 MED ORDER — LIDOCAINE VISCOUS HCL 2 % MT SOLN: OROMUCOSAL | 0 refills | Status: DC

## 2019-05-23 MED ORDER — TRAMADOL HCL 50 MG PO TABS: ORAL_TABLET | ORAL | 0 refills | Status: DC

## 2019-05-23 NOTE — Discharge Instructions (Signed)
Follow up with dentist.

## 2019-05-23 NOTE — ED Provider Notes (Signed)
[facility]    CSN: [id number] Arrival date & time: 05/23/19  1828      History   Chief Complaint Chief Complaint  Patient presents with  . Dental Pain    HPI Eileen Tate is a 33 y.o. female.   33 yo female with a c/o dental pain and broken tooth since yesterday. Patient states she has poor teeth and many cavities. States she's looking for a dentist. Denies any fevers, chills.    Dental Pain   Past Medical History:  Diagnosis Date  . Abscess and cellulitis   . Asthma   . Headache   . Heart murmur   . Tobacco use disorder   . UTI (lower urinary tract infection)     Patient Active Problem List   Diagnosis Date Noted  . Postpartum care following vaginal delivery 05/04/2018  . Vaginal discharge 04/22/2018  . Urinary tract infection 03/17/2018  . Abdominal pain affecting pregnancy 03/08/2018  . History of asthma 04/27/2015  . LSIL (low grade squamous intraepithelial lesion) on Pap smear 12/04/2012    Past Surgical History:  Procedure Laterality Date  . NO PAST SURGERIES    . TUBAL LIGATION N/A 05/04/2018   Procedure: POST PARTUM TUBAL LIGATION;  Surgeon: Conard Novak, MD;  Location: [facility] ORS;  Service: Gynecology;  Laterality: N/A;    OB History    Gravida  7   Para  7   Term  6   Preterm  1   AB  0   Living  7     SAB  0   TAB  0   Ectopic  0   Multiple  0   Live Births  5            Home Medications    Prior to Admission medications   Medication Sig Start Date End Date Taking? Authorizing Provider  albuterol (PROVENTIL HFA;VENTOLIN HFA) 108 (90 Base) MCG/ACT inhaler Inhale 2 puffs into the lungs every 6 (six) hours as needed for wheezing or shortness of breath. 05/04/18  Yes Tresea Mall, CNM  clonazePAM (KLONOPIN) 0.5 MG tablet Take 1 tablet (0.5 mg total) by mouth 2 (two) times daily as needed for anxiety. 12/04/18  Yes Cook, Jayce G, DO  lidocaine (XYLOCAINE) 2 % solution 10 ml to affected area q 6 hours  prn pain 05/23/19   Payton Mccallum, MD  medroxyPROGESTERone (PROVERA) 10 MG tablet Take 1 tablet (10 mg total) by mouth daily for 10 days. 03/13/19 03/23/19  Conard Novak, MD  penicillin v potassium (VEETID) 500 MG tablet Take 1 tablet (500 mg total) by mouth 3 (three) times daily. 05/23/19   Payton Mccallum, MD  predniSONE (STERAPRED UNI-PAK 21 TAB) 10 MG (21) TBPK tablet Take as instructed on package (60, 50, 40, 30, 20, 10) 02/24/19   Bailey Mech, NP  traMADol Janean Sark) 50 MG tablet 1 tab po q 8 hours prn 05/23/19   Payton Mccallum, MD    Family History Family History  Problem Relation Age of Onset  . Cancer Maternal Aunt 19       breast  . Cancer Paternal Aunt 45       breast  . Diabetes Paternal Grandmother   . Heart disease Paternal Grandmother     Social History Social History   Tobacco Use  . Smoking status: Current Every Day Smoker    Packs/day: 0.25    Years: 10.00    Pack years: 2.50    Types: Cigarettes  .  Smokeless tobacco: Never Used  Substance Use Topics  . Alcohol use: Not Currently  . Drug use: No     Allergies   Guaifenesin & derivatives, Excedrin extra strength [aspirin-acetaminophen-caffeine], and Ibuprofen   Review of Systems Review of Systems   Physical Exam Triage Vital Signs ED Triage Vitals  Enc Vitals Group     BP 05/23/19 1911 (!) 136/99     Pulse Rate 05/23/19 1911 87     Resp 05/23/19 1911 16     Temp 05/23/19 1911 98.2 F (36.8 C)     Temp Source 05/23/19 1911 Oral     SpO2 05/23/19 1911 100 %     Weight --      Height 05/23/19 1908 5' (1.524 m)     Head Circumference --      Peak Flow --      Pain Score 05/23/19 1908 8     Pain Loc --      Pain Edu? --      Excl. in [place]? --    No data found.  Updated Vital Signs BP (!) 136/99 (BP Location: Left Arm)   Pulse 87   Temp 98.2 F (36.8 C) (Oral)   Resp 16   Ht 5' (1.524 m)   LMP 05/02/2019   SpO2 100%   Breastfeeding No   BMI 36.91 kg/m   Visual Acuity Right  Eye Distance:   Left Eye Distance:   Bilateral Distance:    Right Eye Near:   Left Eye Near:    Bilateral Near:     Physical Exam Vitals signs and nursing note reviewed.  Constitutional:      General: She is not in acute distress.    Appearance: She is not toxic-appearing or diaphoretic.  HENT:     Mouth/Throat:     Dentition: Abnormal dentition. Dental tenderness and dental caries present.     Pharynx: Oropharynx is clear.  Neurological:     Mental Status: She is alert.      UC Treatments / Results  Labs (all labs ordered are listed, but only abnormal results are displayed) Labs Reviewed - No data to display  EKG   Radiology No results found.  Procedures Procedures (including critical care time)  Medications Ordered in UC Medications - No data to display  Initial Impression / Assessment and Plan / UC Course  I have reviewed the triage vital signs and the nursing notes.  Pertinent labs & imaging results that were available during my care of the patient were reviewed by me and considered in my medical decision making (see chart for details).     Final Clinical Impressions(s) / UC Diagnoses   Final diagnoses:  Dental caries  Pain due to dental caries     Discharge Instructions     Follow up with dentist    ED Prescriptions    Medication Sig Dispense Auth. Provider   penicillin v potassium (VEETID) 500 MG tablet Take 1 tablet (500 mg total) by mouth 3 (three) times daily. 30 tablet Jayleen Scaglione, Linward Foster, MD   lidocaine (XYLOCAINE) 2 % solution 10 ml to affected area q 6 hours prn pain 100 mL Montavis Schubring, Linward Foster, MD   traMADol (ULTRAM) 50 MG tablet 1 tab po q 8 hours prn 8 tablet Mallorie Norrod, Linward Foster, MD      1. diagnosis reviewed with patient 2. rx as per orders above; reviewed possible side effects, interactions, risks and benefits  3. Recommend supportive treatment with otc tylenol prn 4.  Follow up with dentist 5. Follow-up prn  I have reviewed the PDMP during  this encounter.   Payton Mccallumonty, Christin Mccreedy, MD 05/23/19 2017

## 2019-05-23 NOTE — ED Triage Notes (Signed)
Patient complains of dental pain. States that she broke her teeth off at the gums yesterday, (ED note states 10/20). Patient states that she has severe anxiety and wanted to know if she could get her anxiety medication changed.

## 2019-05-25 ENCOUNTER — Telehealth: Payer: Self-pay

## 2019-05-25 MED ORDER — CLINDAMYCIN HCL 150 MG PO CAPS: 450.0000 mg | ORAL_CAPSULE | Freq: Three times a day (TID) | ORAL | 0 refills | Status: DC

## 2019-05-25 NOTE — Telephone Encounter (Signed)
Patient called and states that she is concerned that she may be having a reaction to her penicillin and is broke out in hives. Talked with Tamala Ser, PA and decided that we would switch her Penicillin to Clindamycin. Patient is aware and I have sent this to the pharmacy.

## 2019-06-09 ENCOUNTER — Other Ambulatory Visit: Payer: Self-pay

## 2019-06-09 ENCOUNTER — Emergency Department
Admission: EM | Admit: 2019-06-09 | Discharge: 2019-06-09 | Disposition: A | Discharge status: Home / Self Care | Payer: Medicaid Other | Attending: Emergency Medicine | Admitting: Emergency Medicine

## 2019-06-09 ENCOUNTER — Encounter: Payer: Self-pay | Admitting: Emergency Medicine

## 2019-06-09 DIAGNOSIS — R519 Headache, unspecified: Secondary | ICD-10-CM | POA: Diagnosis not present

## 2019-06-09 DIAGNOSIS — K0889 Other specified disorders of teeth and supporting structures: Secondary | ICD-10-CM | POA: Diagnosis not present

## 2019-06-09 DIAGNOSIS — F17210 Nicotine dependence, cigarettes, uncomplicated: Secondary | ICD-10-CM | POA: Diagnosis not present

## 2019-06-09 DIAGNOSIS — Z79899 Other long term (current) drug therapy: Secondary | ICD-10-CM | POA: Diagnosis not present

## 2019-06-09 MED ORDER — BUTALBITAL-APAP-CAFFEINE 50-325-40 MG PO TABS: 1.0000 | ORAL_TABLET | Freq: Four times a day (QID) | ORAL | 0 refills | Status: AC | PRN

## 2019-06-09 NOTE — Discharge Instructions (Addendum)
OPTIONS FOR DENTAL FOLLOW UP CARE ° °[place] Department of Health and Human Services - Local Safety Net Dental Clinics °http://www.ncdhhs.gov/dph/oralhealth/services/safetynetclinics.htm °  °[facility] (336-562-3123) ° °[facility] [place] (919-933-9087) ° °[facility] ([phone] ext [phone]) ° °[facility] ([phone]) ° °[facility] ([phone]) °This clinic caters to the indigent population and is on a lottery system. °Location: °[facility], [address], [place] °Clinic Hours: °Wednesdays from 6pm - 9pm, patients seen by a lottery system. °For dates, call or go to www.med.[facility].edu/[facility]/patients/Dental-[facility] °Services: °Cleanings, fillings and simple extractions. °Payment Options: °DENTAL WORK IS FREE OF CHARGE. Bring proof of income or support. °Best way to get seen: °Arrive at 5:15 pm - this is a lottery, NOT first come/first serve, so arriving earlier will not increase your chances of being seen. °  °  °[facility] °[phone] °Select option 1 for emergencies °  °Location: °[facility], [address], [place] °Clinic Hours: °No walk-ins accepted - call the day before to schedule an appointment. °Check in times are 9:30 am and 1:30 pm. °Services: °Simple extractions, temporary fillings, pulpectomy/pulp debridement, uncomplicated abscess drainage. °Payment Options: °PAYMENT IS DUE AT THE TIME OF SERVICE.  Fee is usually $100-200, additional surgical procedures (e.g. abscess drainage) may be extra. °Cash, checks, Visa/MasterCard accepted.  Can file Medicaid if patient is covered for dental - patient should call case worker to check. °No discount for [facility] patients. °Best way to get seen: °MUST call the day before and get onto the schedule. Can usually be seen the next 1-2 days. No walk-ins accepted. °  °  °[facility] °[phone] °   °Location: °[facility], [address], [place] °Clinic Hours: °M, W, Th, F 8am or 1:30pm, Tues 9a or 1:30 - first come/first served. °Services: °Simple extractions, temporary fillings, uncomplicated abscess drainage.  You do not need to be an [place] resident. °Payment Options: °PAYMENT IS DUE AT THE TIME OF SERVICE. °Dental insurance, otherwise sliding scale - bring proof of income or support. °Depending on income and treatment needed, cost is usually $50-200. °Best way to get seen: °Arrive early as it is first come/first served. °  °  °[facility] °[phone] °  °Location: °[address] °Clinic Hours: °Mon-Thu 8a-5p °Services: °Most basic dental services including extractions and fillings. °Payment Options: °PAYMENT IS DUE AT THE TIME OF SERVICE. °Sliding scale, up to 50% off - bring proof if income or support. °Medicaid with dental option accepted. °Best way to get seen: °Call to schedule an appointment, can usually be seen within 2 weeks OR they will try to see walk-ins - show up at 8a or 2p (you may have to wait). °  °  °[facility] °919-245-2435 °[place] RESIDENTS ONLY °  °Location: °[facility], [address], [place], [place] [address] °Clinic Hours: By appointment only. °Monday - Thursday 8am-5pm, Friday 8am-12pm °Services: Cleanings, fillings, extractions. °Payment Options: °PAYMENT IS DUE AT THE TIME OF SERVICE. °Cash, Visa or MasterCard. Sliding scale - $30 minimum per service. °Best way to get seen: °Come in to office, complete packet and make an appointment - need proof of income °or support monies for each household member and proof of [place] residence. °Usually takes about a month to get in. °  °  °[facility] °[phone] °  °Location: °[address]   [place] °Clinic Hours: Walk-in Urgent Care Dental Services are offered Monday-Friday  mornings only. °The numbers of emergencies accepted daily is limited to the number of °providers available. °Maximum 15 - Mondays, Wednesdays & Thursdays °Maximum 10 - Tuesdays & Fridays °Services: °You do not need to be a [place] resident to be seen for a dental emergency. °Emergencies are defined as pain, swelling, abnormal bleeding, or dental trauma. Walkins will receive x-rays if needed. °NOTE: Dental cleaning is not an emergency. °Payment Options: °PAYMENT IS DUE AT THE TIME OF SERVICE. °Minimum co-pay is $40.00 for uninsured patients. °Minimum co-pay is $3.00 for Medicaid with dental coverage. °Dental Insurance is accepted and must be presented at time of visit. °Medicare does not cover dental. °Forms of payment: Cash, credit card, checks. °Best way to get seen: °If not previously registered with the clinic, walk-in dental registration begins at 7:15 am and is on a first come/first serve basis. °If previously registered with the clinic, call to make an appointment. °  °  °[facility] °[phone] °[place] RESIDENTS ONLY °  °Location: °[address], [place],  °Clinic Hours: °Mon-Thu 10a-2p °Services: Extractions only! °Payment Options: °FREE (donations accepted) - bring proof of income or support °Best way to get seen: °Call and schedule an appointment OR come at 8am on the 1st Monday of every month (except for holidays) when it is first come/first served. °  °  °[facility] °[phone] °  °Location: °[address], [place] °Clinic Hours: °Friday mornings °Services, Payment Options, Best way to get seen: °Call for info °

## 2019-06-09 NOTE — ED Notes (Signed)
Pt wanting to speak with a supervisor, charge nurse and AD notified.

## 2019-06-09 NOTE — ED Provider Notes (Signed)
[facility] Emergency Department Provider Note  ____________________________________________   None    (approximate)   I have reviewed the triage vital signs and the nursing notes.   Patient has been triaged with a MSE exam performed by myself at a minimum. Based on symptoms and screening exam, patient may receive a more in-depth exam, labs, imaging as detailed below. Patients have been advised of this setting and exam type at the time of patient interview.    HISTORY  Chief Complaint Headache    HPI Eileen Tate is a 33 y.o. female presents to the emergency department with a complaint of Headache. Patient has had a HA x 3 weeks, no trauma. No syncope. Pain behind the R eye/R cheek.Marland Kitchen HX of dental abscess treated with abx. No current dental pain  Patient will receive a medical screening exam as detailed below.  Based off of this exam, more in depth exam, labs, imaging will be performed as needed for complaint.  Patient care will be eventually transferred to another provider in the emergency department for final exam, diagnosis and disposition.    Past Medical History:  Diagnosis Date  . Abscess and cellulitis   . Asthma   . Headache   . Heart murmur   . Tobacco use disorder   . UTI (lower urinary tract infection)     Patient Active Problem List   Diagnosis Date Noted  . Postpartum care following vaginal delivery 05/04/2018  . Vaginal discharge 04/22/2018  . Urinary tract infection 03/17/2018  . Abdominal pain affecting pregnancy 03/08/2018  . History of asthma 04/27/2015  . LSIL (low grade squamous intraepithelial lesion) on Pap smear 12/04/2012    Past Surgical History:  Procedure Laterality Date  . NO PAST SURGERIES    . TUBAL LIGATION N/A 05/04/2018   Procedure: POST PARTUM TUBAL LIGATION;  Surgeon: Will Bonnet, MD;  Location: [facility];  Service: Gynecology;  Laterality: N/A;    Prior to Admission medications    Medication Sig Start Date End Date Taking? Authorizing Provider  albuterol (PROVENTIL HFA;VENTOLIN HFA) 108 (90 Base) MCG/ACT inhaler Inhale 2 puffs into the lungs every 6 (six) hours as needed for wheezing or shortness of breath. 05/04/18   Rod Can, CNM  clindamycin (CLEOCIN) 150 MG capsule Take 3 capsules (450 mg total) by mouth 3 (three) times daily. 05/25/19   Lorin Picket, PA-C  clonazePAM (KLONOPIN) 0.5 MG tablet Take 1 tablet (0.5 mg total) by mouth 2 (two) times daily as needed for anxiety. 12/04/18   Coral Spikes, DO  lidocaine (XYLOCAINE) 2 % solution 10 ml to affected area q 6 hours prn pain 05/23/19   Norval Gable, MD  medroxyPROGESTERone (PROVERA) 10 MG tablet Take 1 tablet (10 mg total) by mouth daily for 10 days. 03/13/19 03/23/19  Will Bonnet, MD  penicillin v potassium (VEETID) 500 MG tablet Take 1 tablet (500 mg total) by mouth 3 (three) times daily. 05/23/19   Norval Gable, MD  predniSONE (STERAPRED UNI-PAK 21 TAB) 10 MG (21) TBPK tablet Take as instructed on package (60, 50, 40, 30, 20, 10) 02/24/19   Gertie Baron, NP  traMADol Veatrice Bourbon) 50 MG tablet 1 tab po q 8 hours prn 05/23/19   Norval Gable, MD    Allergies Guaifenesin & derivatives, Penicillins, Excedrin extra strength [aspirin-acetaminophen-caffeine], and Ibuprofen  Family History  Problem Relation Age of Onset  . Cancer Maternal Aunt 19       breast  . Cancer  Paternal Aunt 62       breast  . Diabetes Paternal Grandmother   . Heart disease Paternal Grandmother     Social History Social History   Tobacco Use  . Smoking status: Current Every Day Smoker    Packs/day: 0.25    Years: 10.00    Pack years: 2.50    Types: Cigarettes  . Smokeless tobacco: Never Used  Substance Use Topics  . Alcohol use: Not Currently  . Drug use: No    Review of Systems Constitutional: no fever ENT: no nasal congestion/rhinorhea. no sore throat. HX fractured tooth Cardiovascular: no chest pain.  Respiratory: no cough. no shortness of breath/difficulty breathing Gastroenterology: no abdominal pain Musculoskeletal: no for musculoskeletal pain Integumentary: Negative for rash. Neurological: No focal weakness nor numbness. Positive headache   ____________________________________________   PHYSICAL EXAM:  VITAL SIGNS: ED Triage Vitals [06/09/19 1241]  Enc Vitals Group     BP      Pulse      Resp      Temp      Temp src      SpO2      Weight 200 lb (90.7 kg)     Height 5' (1.524 m)     Head Circumference      Peak Flow      Pain Score 6     Pain Loc      Pain Edu?      Excl. in [place]?     Constitutional: Alert and oriented. Generally well appearing and in no acute distress. Eyes: Conjunctivae are normal.  Nose: No significant congestion/rhinnorhea. Mouth: No gross oropharyngeal edema. Poor dentition throughout Neck: No stridor.  No meningeal signs.   Cardiovascular: Grossly normal heart sounds. Respiratory: Normal respiratory effort without significant tachypnea and no observed retractions. Lungs CTAB Gastrointestinal: No significant visible abdominal wall findings.  Bowel sounds x4 quadrants. no tenderness to palpation. Musculoskeletal: No gross deformities of extremities. Neurologic:  Normal speech and language. No gross focal neurologic deficits are appreciated. CN II-XII grossly intact Skin:  Skin is warm, dry and intact. No rash noted.    ____________________________________________   LABS (all labs ordered are listed, but only abnormal results are displayed)  Labs Reviewed - No data to display  ____________________________________________   RADIOLOGY   Official radiology report(s): No results found.  ____________________________________________    INITIAL IMPRESSION / MDM / ASSESSMENT AND PLAN / ED COURSE  As part of my medical decision making, I reviewed the following data within the electronic MEDICAL RECORD NUMBER Notes from prior ED visits       Clinical Impression: headache   Plan: no labs/imaging at this time  Patient has been screened based based on their arrival complaint, evaluated for an emergent condition, and at a minimum has received a medical screening exam.  At this time, patient will receive the further work-up listed above that was determined by medical screening exam.  Patient care will be transferred to another provider in the emergency department once patient is roomed for final diagnosis and disposition.    ____________________________________________  Note:  This document was prepared using Conservation officer, historic buildings and may include unintentional dictation errors.    Racheal Patches, PA-C 06/10/19 2300    Emily Filbert, MD 06/11/19 1049

## 2019-06-09 NOTE — ED Triage Notes (Signed)
Pt reports pain to the right side of her head for the past 3 weeks. Pt reports has been seen multiple times for the same and they keep telling her is it caused by an infection in her gum. Pt reports has been on 2 abx for the same and no relief. Pt reports has taken tylenol with codeine for pain with no relief.

## 2019-06-09 NOTE — ED Provider Notes (Signed)
[facility] Emergency Department Provider Note  Time seen: 2:40 PM  I have reviewed the triage vital signs and the nursing notes.   HISTORY  Chief Complaint Headache    HPI Eileen Tate is a 33 y.o. female with a past medical history of asthma, anxiety, presents to the emergency department for a headache.  According to the patient for the past 2 to 3 weeks she has been suffering from a right-sided headache, states she fractured a tooth 2 to 3 weeks ago which is when the headaches started.  Patient states she has been calling a dentist but has not been able to get in as of yet.  Patient states the headache continues in today she was feeling like she was having a "panic attack."  Patient states she was placed on Klonopin approximately 1 month ago by her PCP for anxiety.  Denies any fever cough.  Denies shortness of breath.  Continues to have pain in the left teeth.  States she was initially prescribed penicillin but had a rash/allergic reaction so she was switched to clindamycin.  States she has several pills remaining.   Past Medical History:  Diagnosis Date  . Abscess and cellulitis   . Asthma   . Headache   . Heart murmur   . Tobacco use disorder   . UTI (lower urinary tract infection)     Patient Active Problem List   Diagnosis Date Noted  . Postpartum care following vaginal delivery 05/04/2018  . Vaginal discharge 04/22/2018  . Urinary tract infection 03/17/2018  . Abdominal pain affecting pregnancy 03/08/2018  . History of asthma 04/27/2015  . LSIL (low grade squamous intraepithelial lesion) on Pap smear 12/04/2012    Past Surgical History:  Procedure Laterality Date  . NO PAST SURGERIES    . TUBAL LIGATION N/A 05/04/2018   Procedure: POST PARTUM TUBAL LIGATION;  Surgeon: Will Bonnet, MD;  Location: [facility] ORS;  Service: Gynecology;  Laterality: N/A;    Prior to Admission medications   Medication Sig Start Date End Date Taking?  Authorizing Provider  albuterol (PROVENTIL HFA;VENTOLIN HFA) 108 (90 Base) MCG/ACT inhaler Inhale 2 puffs into the lungs every 6 (six) hours as needed for wheezing or shortness of breath. 05/04/18   Rod Can, CNM  clindamycin (CLEOCIN) 150 MG capsule Take 3 capsules (450 mg total) by mouth 3 (three) times daily. 05/25/19   Lorin Picket, PA-C  clonazePAM (KLONOPIN) 0.5 MG tablet Take 1 tablet (0.5 mg total) by mouth 2 (two) times daily as needed for anxiety. 12/04/18   Coral Spikes, DO  lidocaine (XYLOCAINE) 2 % solution 10 ml to affected area q 6 hours prn pain 05/23/19   Norval Gable, MD  medroxyPROGESTERone (PROVERA) 10 MG tablet Take 1 tablet (10 mg total) by mouth daily for 10 days. 03/13/19 03/23/19  Will Bonnet, MD  penicillin v potassium (VEETID) 500 MG tablet Take 1 tablet (500 mg total) by mouth 3 (three) times daily. 05/23/19   Norval Gable, MD  predniSONE (STERAPRED UNI-PAK 21 TAB) 10 MG (21) TBPK tablet Take as instructed on package (60, 50, 40, 30, 20, 10) 02/24/19   Gertie Baron, NP  traMADol Veatrice Bourbon) 50 MG tablet 1 tab po q 8 hours prn 05/23/19   Norval Gable, MD    Allergies  Allergen Reactions  . Guaifenesin & Derivatives Hives and Swelling  . Penicillins Swelling  . Excedrin Extra Strength [Aspirin-Acetaminophen-Caffeine] Other (See Comments)    Blisters  . Ibuprofen  Other (See Comments)    Blisters     Family History  Problem Relation Age of Onset  . Cancer Maternal Aunt 19       breast  . Cancer Paternal Aunt 747       breast  . Diabetes Paternal Grandmother   . Heart disease Paternal Grandmother     Social History Social History   Tobacco Use  . Smoking status: Current Every Day Smoker    Packs/day: 0.25    Years: 10.00    Pack years: 2.50    Types: Cigarettes  . Smokeless tobacco: Never Used  Substance Use Topics  . Alcohol use: Not Currently  . Drug use: No    Review of Systems Constitutional: Negative for  fever. Cardiovascular: Negative for chest pain. Respiratory: Negative for shortness of breath. Gastrointestinal: Negative for abdominal pain Musculoskeletal: Negative for musculoskeletal complaints Neurological: Negative for headache All other ROS negative  ____________________________________________   PHYSICAL EXAM:  VITAL SIGNS: ED Triage Vitals [06/09/19 1241]  Enc Vitals Group     BP      Pulse      Resp      Temp      Temp src      SpO2      Weight 200 lb (90.7 kg)     Height 5' (1.524 m)     Head Circumference      Peak Flow      Pain Score 6     Pain Loc      Pain Edu?      Excl. in [place]?     Constitutional: Alert and oriented. Well appearing and in no distress. Eyes: Normal exam ENT      Head: Normocephalic and atraumatic.      Mouth/Throat: Mucous membranes are moist.  Overall very poor dentition several fractured and decaying teeth.  No obvious abscess to the area of discomfort. Cardiovascular: Normal rate, regular rhythm Respiratory: Normal respiratory effort without tachypnea nor retractions. Breath sounds are clear  Gastrointestinal: Soft and nontender. No distention.  Musculoskeletal: Nontender with normal range of motion in all extremities. Neurologic:  Normal speech and language. No gross focal neurologic deficits  Skin:  Skin is warm, dry and intact.  Psychiatric: Mood and affect are normal.   ____________________________________________     INITIAL IMPRESSION / ASSESSMENT AND PLAN / ED COURSE  Pertinent labs & imaging results that were available during my care of the patient were reviewed by me and considered in my medical decision making (see chart for details).   Patient presents to the emergency department for continued headache.  States she has been experiencing intermittent right-sided headaches ever since she fractured a tooth several weeks ago.  Patient has very poor dentition overall, with multiple fractured and decayed teeth.  I discussed  the importance of seeing a dentist, patient is agreeable we will provide a list of local dentists and clinics for the patient.  Patient still is on an antibiotic and is finishing a course of clindamycin which I believe is an appropriate antibiotic.  No fever.  We will place the patient on Fioricet for headache and have the patient follow-up with her doctor.  Patient agreeable to plan of care.  Eileen Tate was evaluated in Emergency Department on 06/09/2019 for the symptoms described in the history of present illness. She was evaluated in the context of the global COVID-19 pandemic, which necessitated consideration that the patient might be at risk for infection with the  SARS-CoV-2 virus that causes COVID-19. Institutional protocols and algorithms that pertain to the evaluation of patients at risk for COVID-19 are in a state of rapid change based on information released by regulatory bodies including the CDC and federal and state organizations. These policies and algorithms were followed during the patient's care in the ED.  ____________________________________________   FINAL CLINICAL IMPRESSION(S) / ED DIAGNOSES  Headache Dental pain   Minna Antis, MD 06/09/19 1442

## 2019-06-24 ENCOUNTER — Encounter: Payer: Self-pay | Admitting: *Deleted

## 2019-06-24 ENCOUNTER — Other Ambulatory Visit: Payer: Self-pay

## 2019-06-24 ENCOUNTER — Telehealth: Payer: Self-pay | Admitting: Emergency Medicine

## 2019-06-24 ENCOUNTER — Emergency Department
Admission: EM | Admit: 2019-06-24 | Discharge: 2019-06-24 | Disposition: A | Discharge status: Home / Self Care | Payer: Medicaid Other | Attending: Emergency Medicine | Admitting: Emergency Medicine

## 2019-06-24 DIAGNOSIS — N644 Mastodynia: Secondary | ICD-10-CM | POA: Diagnosis present

## 2019-06-24 DIAGNOSIS — F10929 Alcohol use, unspecified with intoxication, unspecified: Secondary | ICD-10-CM | POA: Diagnosis not present

## 2019-06-24 DIAGNOSIS — Z5321 Procedure and treatment not carried out due to patient leaving prior to being seen by health care provider: Secondary | ICD-10-CM | POA: Insufficient documentation

## 2019-06-24 LAB — CBC
HCT: 46.8 % — ABNORMAL HIGH (ref 36.0–46.0)
Hemoglobin: 16 g/dL — ABNORMAL HIGH (ref 12.0–15.0)
MCH: 33.1 pg (ref 26.0–34.0)
MCHC: 34.2 g/dL (ref 30.0–36.0)
MCV: 96.7 fL (ref 80.0–100.0)
Platelets: 272 10*3/uL (ref 150–400)
RBC: 4.84 MIL/uL (ref 3.87–5.11)
RDW: 13.2 % (ref 11.5–15.5)
WBC: 10.6 10*3/uL — ABNORMAL HIGH (ref 4.0–10.5)
nRBC: 0 % (ref 0.0–0.2)

## 2019-06-24 LAB — COMPREHENSIVE METABOLIC PANEL
ALT: 38 U/L (ref 0–44)
AST: 47 U/L — ABNORMAL HIGH (ref 15–41)
Albumin: 4 g/dL (ref 3.5–5.0)
Alkaline Phosphatase: 71 U/L (ref 38–126)
Anion gap: 15 (ref 5–15)
BUN: 6 mg/dL (ref 6–20)
CO2: 21 mmol/L — ABNORMAL LOW (ref 22–32)
Calcium: 9.7 mg/dL (ref 8.9–10.3)
Chloride: 101 mmol/L (ref 98–111)
Creatinine, Ser: 0.71 mg/dL (ref 0.44–1.00)
GFR calc Af Amer: >60 mL/min (ref >60)
GFR calc non Af Amer: >60 mL/min (ref >60)
Glucose, Bld: 89 mg/dL (ref 70–99)
Potassium: 4.2 mmol/L (ref 3.5–5.1)
Sodium: 137 mmol/L (ref 135–145)
Total Bilirubin: 1 mg/dL (ref 0.3–1.2)
Total Protein: 7.8 g/dL (ref 6.5–8.1)

## 2019-06-24 NOTE — ED Triage Notes (Signed)
Pt has stated x 4 that she is not going to wait without her husband at her side. We have informed her of the visitor policy. Several times. Pt has reiterated her hx of anxiety and that she could not wait w/o her husband and she would leave if he couldn't be with her.

## 2019-06-24 NOTE — ED Notes (Signed)
Pt reports leaving now due to long wait; pt left with husband

## 2019-06-24 NOTE — Telephone Encounter (Signed)
Called patient due to lwot to inquire about condition and follow up plans. Left message.   

## 2019-06-24 NOTE — ED Triage Notes (Signed)
Pt presents w/ c/o R breast pain and a mass in R breast starting yesterday. Pt states when she woke it was present. Pt was very unsteady on her feet upon presentation. Pt has slurred speech and smells of ETOH. Pt states PMH of anxiety and recent death in her family.

## 2019-06-29 NOTE — Progress Notes (Deleted)
Patient, No Pcp Per   No chief complaint on file.   HPI:      Ms. Eileen Tate is a 33 y.o. [id number] who LMP was Patient's last menstrual period was 06/02/2019 (approximate)., presents today for breast exam    Patient Active Problem List   Diagnosis Date Noted  . Postpartum care following vaginal delivery 05/04/2018  . Vaginal discharge 04/22/2018  . Urinary tract infection 03/17/2018  . Abdominal pain affecting pregnancy 03/08/2018  . History of asthma 04/27/2015  . LSIL (low grade squamous intraepithelial lesion) on Pap smear 12/04/2012    Past Surgical History:  Procedure Laterality Date  . NO PAST SURGERIES    . TUBAL LIGATION N/A 05/04/2018   Procedure: POST PARTUM TUBAL LIGATION;  Surgeon: Conard Novak, MD;  Location: [facility] ORS;  Service: Gynecology;  Laterality: N/A;    Family History  Problem Relation Age of Onset  . Cancer Maternal Aunt 19       breast  . Cancer Paternal Aunt 30       breast  . Diabetes Paternal Grandmother   . Heart disease Paternal Grandmother     Social History   Socioeconomic History  . Marital status: Married    Spouse name: Richard  . Number of children: 6  . Years of education: Not on file  . Highest education level: Not on file  Occupational History  . Occupation: unemployed  Social Needs  . Financial resource strain: Not on file  . Food insecurity    Worry: Not on file    Inability: Not on file  . Transportation needs    Medical: Yes    Non-medical: Yes  Tobacco Use  . Smoking status: Current Every Day Smoker    Packs/day: 0.25    Years: 10.00    Pack years: 2.50    Types: Cigarettes  . Smokeless tobacco: Never Used  Substance and Sexual Activity  . Alcohol use: Yes  . Drug use: No  . Sexual activity: Not Currently    Birth control/protection: Surgical    Comment: Tubal ligation  Lifestyle  . Physical activity    Days per week: Not on file    Minutes per session: Not on file  . Stress: Not on  file  Relationships  . Social Musician on phone: Not on file    Gets together: Not on file    Attends religious service: Not on file    Active member of club or organization: Not on file    Attends meetings of clubs or organizations: Not on file    Relationship status: Not on file  . Intimate partner violence    Fear of current or ex partner: Not on file    Emotionally abused: Not on file    Physically abused: Not on file    Forced sexual activity: Not on file  Other Topics Concern  . Not on file  Social History Narrative   Joint custody of 2 daughters with ex-husband   Step-daughter in home with 4 other children    Outpatient Medications Prior to Visit  Medication Sig Dispense Refill  . albuterol (PROVENTIL HFA;VENTOLIN HFA) 108 (90 Base) MCG/ACT inhaler Inhale 2 puffs into the lungs every 6 (six) hours as needed for wheezing or shortness of breath. 1 Inhaler 2  . butalbital-acetaminophen-caffeine (FIORICET) 50-325-40 MG tablet Take 1-2 tablets by mouth every 6 (six) hours as needed for headache. 20 tablet 0  . clindamycin (CLEOCIN)  150 MG capsule Take 3 capsules (450 mg total) by mouth 3 (three) times daily. 63 capsule 0  . clonazePAM (KLONOPIN) 0.5 MG tablet Take 1 tablet (0.5 mg total) by mouth 2 (two) times daily as needed for anxiety. 30 tablet 0  . lidocaine (XYLOCAINE) 2 % solution 10 ml to affected area q 6 hours prn pain 100 mL 0  . medroxyPROGESTERone (PROVERA) 10 MG tablet Take 1 tablet (10 mg total) by mouth daily for 10 days. 10 tablet 0  . penicillin v potassium (VEETID) 500 MG tablet Take 1 tablet (500 mg total) by mouth 3 (three) times daily. 30 tablet 0  . predniSONE (STERAPRED UNI-PAK 21 TAB) 10 MG (21) TBPK tablet Take as instructed on package (60, 50, 40, 30, 20, 10) 1 each 1  . traMADol (ULTRAM) 50 MG tablet 1 tab po q 8 hours prn 8 tablet 0   No facility-administered medications prior to visit.       ROS:  Review of Systems BREAST: No  symptoms   OBJECTIVE:   Vitals:  LMP 06/02/2019 (Approximate)   Physical Exam  Results: No results found for this or any previous visit (from the past 24 hour(s)).   Assessment/Plan: No diagnosis found.    No orders of the defined types were placed in this encounter.     No follow-ups on file.  Alicia B. Copland, PA-C 06/29/2019 5:03 PM

## 2019-06-30 ENCOUNTER — Ambulatory Visit: Payer: Medicaid Other | Admitting: Obstetrics and Gynecology

## 2019-07-07 NOTE — Progress Notes (Deleted)
Patient, No Pcp Per   No chief complaint on file.   HPI:      Ms. Eileen Tate is a 33 y.o. [id number] who LMP was No LMP recorded., presents today for breast  ? Ammo.. FH breast    Patient Active Problem List   Diagnosis Date Noted  . Postpartum care following vaginal delivery 05/04/2018  . Vaginal discharge 04/22/2018  . Urinary tract infection 03/17/2018  . Abdominal pain affecting pregnancy 03/08/2018  . History of asthma 04/27/2015  . LSIL (low grade squamous intraepithelial lesion) on Pap smear 12/04/2012    Past Surgical History:  Procedure Laterality Date  . NO PAST SURGERIES    . TUBAL LIGATION N/A 05/04/2018   Procedure: POST PARTUM TUBAL LIGATION;  Surgeon: Conard Novak, MD;  Location: [facility] ORS;  Service: Gynecology;  Laterality: N/A;    Family History  Problem Relation Age of Onset  . Cancer Maternal Aunt 19       breast  . Cancer Paternal Aunt 9       breast  . Diabetes Paternal Grandmother   . Heart disease Paternal Grandmother     Social History   Socioeconomic History  . Marital status: Married    Spouse name: Richard  . Number of children: 6  . Years of education: Not on file  . Highest education level: Not on file  Occupational History  . Occupation: unemployed  Social Needs  . Financial resource strain: Not on file  . Food insecurity    Worry: Not on file    Inability: Not on file  . Transportation needs    Medical: Yes    Non-medical: Yes  Tobacco Use  . Smoking status: Current Every Day Smoker    Packs/day: 0.25    Years: 10.00    Pack years: 2.50    Types: Cigarettes  . Smokeless tobacco: Never Used  Substance and Sexual Activity  . Alcohol use: Yes  . Drug use: No  . Sexual activity: Not Currently    Birth control/protection: Surgical    Comment: Tubal ligation  Lifestyle  . Physical activity    Days per week: Not on file    Minutes per session: Not on file  . Stress: Not on file  Relationships  .  Social Musician on phone: Not on file    Gets together: Not on file    Attends religious service: Not on file    Active member of club or organization: Not on file    Attends meetings of clubs or organizations: Not on file    Relationship status: Not on file  . Intimate partner violence    Fear of current or ex partner: Not on file    Emotionally abused: Not on file    Physically abused: Not on file    Forced sexual activity: Not on file  Other Topics Concern  . Not on file  Social History Narrative   Joint custody of 2 daughters with ex-husband   Step-daughter in home with 4 other children    Outpatient Medications Prior to Visit  Medication Sig Dispense Refill  . albuterol (PROVENTIL HFA;VENTOLIN HFA) 108 (90 Base) MCG/ACT inhaler Inhale 2 puffs into the lungs every 6 (six) hours as needed for wheezing or shortness of breath. 1 Inhaler 2  . butalbital-acetaminophen-caffeine (FIORICET) 50-325-40 MG tablet Take 1-2 tablets by mouth every 6 (six) hours as needed for headache. 20 tablet 0  . clindamycin (CLEOCIN)  150 MG capsule Take 3 capsules (450 mg total) by mouth 3 (three) times daily. 63 capsule 0  . clonazePAM (KLONOPIN) 0.5 MG tablet Take 1 tablet (0.5 mg total) by mouth 2 (two) times daily as needed for anxiety. 30 tablet 0  . lidocaine (XYLOCAINE) 2 % solution 10 ml to affected area q 6 hours prn pain 100 mL 0  . medroxyPROGESTERone (PROVERA) 10 MG tablet Take 1 tablet (10 mg total) by mouth daily for 10 days. 10 tablet 0  . penicillin v potassium (VEETID) 500 MG tablet Take 1 tablet (500 mg total) by mouth 3 (three) times daily. 30 tablet 0  . predniSONE (STERAPRED UNI-PAK 21 TAB) 10 MG (21) TBPK tablet Take as instructed on package (60, 50, 40, 30, 20, 10) 1 each 1  . traMADol (ULTRAM) 50 MG tablet 1 tab po q 8 hours prn 8 tablet 0   No facility-administered medications prior to visit.       ROS:  Review of Systems BREAST: No symptoms   OBJECTIVE:    Vitals:  There were no vitals taken for this visit.  Physical Exam  Results: No results found for this or any previous visit (from the past 24 hour(s)).   Assessment/Plan: No diagnosis found.    No orders of the defined types were placed in this encounter.     No follow-ups on file.  Binta Statzer B. Stesha Neyens, PA-C 07/07/2019 8:35 PM

## 2019-07-08 ENCOUNTER — Ambulatory Visit: Payer: Medicaid Other | Admitting: Obstetrics and Gynecology

## 2019-10-07 ENCOUNTER — Emergency Department
Admission: EM | Admit: 2019-10-07 | Discharge: 2019-10-07 | Disposition: A | Discharge status: Home / Self Care | Payer: Medicaid Other | Attending: Emergency Medicine | Admitting: Emergency Medicine

## 2019-10-07 ENCOUNTER — Encounter: Payer: Self-pay | Admitting: Emergency Medicine

## 2019-10-07 ENCOUNTER — Other Ambulatory Visit: Payer: Self-pay

## 2019-10-07 DIAGNOSIS — F17210 Nicotine dependence, cigarettes, uncomplicated: Secondary | ICD-10-CM | POA: Insufficient documentation

## 2019-10-07 DIAGNOSIS — Z79899 Other long term (current) drug therapy: Secondary | ICD-10-CM | POA: Insufficient documentation

## 2019-10-07 DIAGNOSIS — N6310 Unspecified lump in the right breast, unspecified quadrant: Secondary | ICD-10-CM | POA: Insufficient documentation

## 2019-10-07 DIAGNOSIS — Z803 Family history of malignant neoplasm of breast: Secondary | ICD-10-CM | POA: Insufficient documentation

## 2019-10-07 DIAGNOSIS — N644 Mastodynia: Secondary | ICD-10-CM | POA: Diagnosis not present

## 2019-10-07 DIAGNOSIS — N63 Unspecified lump in unspecified breast: Secondary | ICD-10-CM

## 2019-10-07 LAB — CBC WITH DIFFERENTIAL/PLATELET
Abs Immature Granulocytes: 0.03 10*3/uL (ref 0.00–0.07)
Basophils Absolute: 0.1 10*3/uL (ref 0.0–0.1)
Basophils Relative: 1 %
Eosinophils Absolute: 0.3 10*3/uL (ref 0.0–0.5)
Eosinophils Relative: 3 %
HCT: 51.2 % — ABNORMAL HIGH (ref 36.0–46.0)
Hemoglobin: 17.6 g/dL — ABNORMAL HIGH (ref 12.0–15.0)
Immature Granulocytes: 0 %
Lymphocytes Relative: 17 %
Lymphs Abs: 1.9 10*3/uL (ref 0.7–4.0)
MCH: 33.8 pg (ref 26.0–34.0)
MCHC: 34.4 g/dL (ref 30.0–36.0)
MCV: 98.3 fL (ref 80.0–100.0)
Monocytes Absolute: 0.5 10*3/uL (ref 0.1–1.0)
Monocytes Relative: 5 %
Neutro Abs: 8.3 10*3/uL — ABNORMAL HIGH (ref 1.7–7.7)
Neutrophils Relative %: 74 %
Platelets: 281 10*3/uL (ref 150–400)
RBC: 5.21 MIL/uL — ABNORMAL HIGH (ref 3.87–5.11)
RDW: 13.4 % (ref 11.5–15.5)
WBC: 11.1 10*3/uL — ABNORMAL HIGH (ref 4.0–10.5)
nRBC: 0 % (ref 0.0–0.2)

## 2019-10-07 LAB — BASIC METABOLIC PANEL
Anion gap: 13 (ref 5–15)
BUN: 8 mg/dL (ref 6–20)
CO2: 21 mmol/L — ABNORMAL LOW (ref 22–32)
Calcium: 9.2 mg/dL (ref 8.9–10.3)
Chloride: 102 mmol/L (ref 98–111)
Creatinine, Ser: 0.77 mg/dL (ref 0.44–1.00)
GFR calc Af Amer: >60 mL/min (ref >60)
GFR calc non Af Amer: >60 mL/min (ref >60)
Glucose, Bld: 104 mg/dL — ABNORMAL HIGH (ref 70–99)
Potassium: 4.2 mmol/L (ref 3.5–5.1)
Sodium: 136 mmol/L (ref 135–145)

## 2019-10-07 MED ORDER — ACETAMINOPHEN 325 MG PO TABS
650.0000 mg | ORAL_TABLET | Freq: Once | ORAL | Status: AC
  Administered 2019-10-07: 650 mg via ORAL
  Filled 2019-10-07: qty 2

## 2019-10-07 NOTE — Discharge Instructions (Signed)
Please follow-up with general surgery for further evaluation

## 2019-10-07 NOTE — ED Provider Notes (Signed)
[facility] Emergency Department Provider Note   ____________________________________________    I have reviewed the triage vital signs and the nursing notes.   HISTORY  Chief Complaint Breast Pain     HPI Eileen Tate is a 34 y.o. female who presents with complaints of right breast lump and soreness.  Patient first noticed this 2 days ago.  She is concerned because she had an aunt recently diagnosed with breast cancer.  Denies nipple discharge.  No erythema, no fevers or chills.  Has not take anything for this.  No streaking or swelling  Past Medical History:  Diagnosis Date  . Abscess and cellulitis   . Asthma   . Headache   . Heart murmur   . Tobacco use disorder   . UTI (lower urinary tract infection)     Patient Active Problem List   Diagnosis Date Noted  . Postpartum care following vaginal delivery 05/04/2018  . Vaginal discharge 04/22/2018  . Urinary tract infection 03/17/2018  . Abdominal pain affecting pregnancy 03/08/2018  . History of asthma 04/27/2015  . LSIL (low grade squamous intraepithelial lesion) on Pap smear 12/04/2012    Past Surgical History:  Procedure Laterality Date  . NO PAST SURGERIES    . TUBAL LIGATION N/A 05/04/2018   Procedure: POST PARTUM TUBAL LIGATION;  Surgeon: Conard Novak, MD;  Location: [facility] ORS;  Service: Gynecology;  Laterality: N/A;    Prior to Admission medications   Medication Sig Start Date End Date Taking? Authorizing Provider  albuterol (PROVENTIL HFA;VENTOLIN HFA) 108 (90 Base) MCG/ACT inhaler Inhale 2 puffs into the lungs every 6 (six) hours as needed for wheezing or shortness of breath. 05/04/18   Tresea Mall, CNM  butalbital-acetaminophen-caffeine (FIORICET) [phone] MG tablet Take 1-2 tablets by mouth every 6 (six) hours as needed for headache. 06/09/19 06/08/20  Minna Antis, MD  clindamycin (CLEOCIN) 150 MG capsule Take 3 capsules (450 mg total) by mouth 3 (three)  times daily. 05/25/19   Lutricia Feil, PA-C  clonazePAM (KLONOPIN) 0.5 MG tablet Take 1 tablet (0.5 mg total) by mouth 2 (two) times daily as needed for anxiety. 12/04/18   Tommie Sams, DO  lidocaine (XYLOCAINE) 2 % solution 10 ml to affected area q 6 hours prn pain 05/23/19   Payton Mccallum, MD  medroxyPROGESTERone (PROVERA) 10 MG tablet Take 1 tablet (10 mg total) by mouth daily for 10 days. 03/13/19 03/23/19  Conard Novak, MD  penicillin v potassium (VEETID) 500 MG tablet Take 1 tablet (500 mg total) by mouth 3 (three) times daily. 05/23/19   Payton Mccallum, MD  predniSONE (STERAPRED UNI-PAK 21 TAB) 10 MG (21) TBPK tablet Take as instructed on package (60, 50, 40, 30, 20, 10) 02/24/19   Bailey Mech, NP  traMADol Janean Sark) 50 MG tablet 1 tab po q 8 hours prn 05/23/19   Payton Mccallum, MD     Allergies Guaifenesin & derivatives, Penicillins, Excedrin extra strength [aspirin-acetaminophen-caffeine], and Ibuprofen  Family History  Problem Relation Age of Onset  . Cancer Maternal Aunt 19       breast  . Cancer Paternal Aunt 63       breast  . Diabetes Paternal Grandmother   . Heart disease Paternal Grandmother     Social History Social History   Tobacco Use  . Smoking status: Current Every Day Smoker    Packs/day: 0.25    Years: 10.00    Pack years: 2.50    Types: Cigarettes  .  Smokeless tobacco: Never Used  Substance Use Topics  . Alcohol use: Yes  . Drug use: No    Review of Systems  Constitutional: No fever/chills         Musculoskeletal: Negative for back pain. Skin: As above     ____________________________________________   PHYSICAL EXAM:  VITAL SIGNS: ED Triage Vitals  Enc Vitals Group     BP 10/07/19 1451 138/84     Pulse Rate 10/07/19 1451 (!) 116     Resp 10/07/19 1451 16     Temp 10/07/19 1451 98.5 F (36.9 C)     Temp Source 10/07/19 1451 Oral     SpO2 10/07/19 1451 99 %     Weight 10/07/19 1452 81.6 kg (180 lb)     Height  10/07/19 1452 1.524 m (5')     Head Circumference --      Peak Flow --      Pain Score 10/07/19 1452 8     Pain Loc --      Pain Edu? --      Excl. in [place]? --      Constitutional: Alert and oriented.   Nose: No congestion/rhinnorhea. Mouth/Throat: Mucous membranes are moist.   Cardiovascular: Normal rate, regular rhythm.  Respiratory: Normal respiratory effort.  No retractions. Genitourinary: deferred Musculoskeletal: No lower extremity tenderness nor edema.   Neurologic:  Normal speech and language. No gross focal neurologic deficits are appreciated.   Skin:  Skin is warm, dry and intact.  Breast exam: 1 cm cystic mobile lump noted at 2 o'clock position, no erythema or tenderness, no nipple discharge.  No orange peel   ____________________________________________   LABS (all labs ordered are listed, but only abnormal results are displayed)  Labs Reviewed  BASIC METABOLIC PANEL - Abnormal; Notable for the following components:      Result Value   CO2 21 (*)    Glucose, Bld 104 (*)    All other components within normal limits  CBC WITH DIFFERENTIAL/PLATELET - Abnormal; Notable for the following components:   WBC 11.1 (*)    RBC 5.21 (*)    Hemoglobin 17.6 (*)    HCT 51.2 (*)    Neutro Abs 8.3 (*)    All other components within normal limits   ____________________________________________  EKG   ____________________________________________  RADIOLOGY   ____________________________________________   PROCEDURES  Procedure(s) performed: No  Procedures   Critical Care performed: No ____________________________________________   INITIAL IMPRESSION / ASSESSMENT AND PLAN / ED COURSE  Pertinent labs & imaging results that were available during my care of the patient were reviewed by me and considered in my medical decision making (see chart for details).  Patient with relatively reassuring exam, suspect fibrocystic change versus breast cyst regardless with her  family history she will need close follow-up, have referred her to general surgery for further evaluation, imaging.  No evidence of abscess or infection   ____________________________________________   FINAL CLINICAL IMPRESSION(S) / ED DIAGNOSES  Final diagnoses:  Breast lump in female      NEW MEDICATIONS STARTED DURING THIS VISIT:  Discharge Medication List as of 10/07/2019  4:01 PM       Note:  This document was prepared using Dragon voice recognition software and may include unintentional dictation errors.   Lavonia Drafts, MD 10/07/19 1807

## 2019-10-07 NOTE — ED Triage Notes (Signed)
Pt in via POV, reports knot to right breast x 2 days, pain worsening today.  Ambulatory to triage, NAD noted at this time.

## 2019-10-07 NOTE — ED Notes (Signed)
Pt c/o right breast pain and knot to right breast.  No fevers. No current drainage.  Pt also feels somewhat anxious currently, does have history of anxiety.

## 2019-10-13 ENCOUNTER — Ambulatory Visit (INDEPENDENT_AMBULATORY_CARE_PROVIDER_SITE_OTHER): Payer: Medicaid Other | Admitting: Surgery

## 2019-10-13 ENCOUNTER — Other Ambulatory Visit: Payer: Self-pay

## 2019-10-13 ENCOUNTER — Ambulatory Visit: Payer: Medicaid Other | Admitting: Surgery

## 2019-10-13 ENCOUNTER — Encounter: Payer: Self-pay | Admitting: Surgery

## 2019-10-13 DIAGNOSIS — Z803 Family history of malignant neoplasm of breast: Secondary | ICD-10-CM | POA: Diagnosis not present

## 2019-10-13 DIAGNOSIS — N6312 Unspecified lump in the right breast, upper inner quadrant: Secondary | ICD-10-CM

## 2019-10-13 DIAGNOSIS — N6310 Unspecified lump in the right breast, unspecified quadrant: Secondary | ICD-10-CM

## 2019-10-13 NOTE — Patient Instructions (Addendum)
Patient is scheduled for a Bilateral Mammogram and Ultrasound of Right Breast at [facility] on Tuesday March 23rd at 2:40p. If you have any questions or concerns, please give our office a call. [address] [place],  [place]  [address] [phone]    Fibrocystic Breast Changes  Fibrocystic breast changes are changes that can make your breasts swollen or painful. These changes happen when tiny sacs of fluid (cysts) form in the breast. This is a common condition. It does not mean that you have cancer. It usually happens because of hormone changes during a monthly period. Follow these instructions at home:  Check your breasts after every monthly period. If you do not have monthly periods, check your breasts on the first day of every month. Check for: ? Soreness. ? New swelling or puffiness. ? A change in breast size. ? A change in a lump that was already there.  Take over-the-counter and prescription medicines only as told by your doctor.  Wear a support or sports bra that fits well. Wear this support especially when you are exercising.  Avoid or have less caffeine, fat, and sugar in what you eat and drink as told by your doctor. Contact a doctor if:  You have fluid coming from your nipple, especially if the fluid has blood in it.  You have new lumps or bumps in your breast.  Your breast gets puffy, red, and painful.  You have changes in how your breast looks.  Your nipple looks flat or it sinks into your breast. Get help right away if:  Your breast turns red, and the redness is spreading. Summary  Fibrocystic breast changes are changes that can make your breasts swollen or painful.  This condition can happen when you have hormone changes during your monthly period.  With this condition, it is important to check your breasts after every monthly period. If you do not have monthly periods, check your breasts on the first day of every month. This information is  not intended to replace advice given to you by your health care provider. Make sure you discuss any questions you have with your health care provider. Document Revised: 11/07/2018 Document Reviewed: 03/30/2016 Elsevier Patient Education  2020 Elsevier Inc.   Breast Cyst  A breast cyst is a sac in the breast that is filled with fluid. They are usually noncancerous (benign) and are common among women. Breast cysts are most often in the upper, outer portion of the breast. One or more cysts may develop. They form when fluid builds up inside the breast glands. There are several types of breast cysts. Some are too small to feel, but these can be seen with imaging tests such as an X-ray of the breast (mammogram) or ultrasound. Breast cysts do not increase your risk of breast cancer. They usually disappear after you no longer have a menstrual cycle (after menopause), unless you take artificial hormones (are on hormone therapy). What are the causes? This condition may be caused by:  Blockage of tubes (ducts) in the breast glands, which leads to fluid buildup. Duct blockage may result from: ? Fibrocystic breast changes. This is a common, benign condition that occurs when women go through hormonal changes during the menstrual cycle. This is a common cause of multiple breast cysts. ? Overgrowth of breast tissue or breast glands. ? Scar tissue in the breast from previous surgery.  Changes in certain female hormones (estrogen and progesterone). The exact cause of this condition is not known.  What increases the risk? You may be more likely to develop breast cysts if you have not gone through menopause. What are the signs or symptoms? Symptoms of this condition include:  Feeling one or more smooth, round, soft lumps (like grapes) in the breast that are easily movable. The lump or lumps may get bigger and more painful before your menstrual period and get smaller after your menstrual period.  Breast  discomfort or pain. How is this diagnosed? This condition may be diagnosed based on:  A physical exam. A cyst can be felt by your health care provider during this exam.  Imaging tests, such as mammogram or ultrasound. Fluid may be removed from the cyst with a needle (fine-needle aspiration) and tested to make sure the cyst is not cancerous. How is this treated? Treatment may not be needed for this condition. Your health care provider may monitor the cyst to see if it goes away on its own. If the cyst is uncomfortable or gets bigger, or if you do not like how the cyst makes your breast look, you may need treatment. Treatment may include:  Hormone therapy.  Fine-needle aspiration to drain fluid from the cyst. There is a chance of the cyst coming back (recurring) after aspiration.  Surgery to remove the cyst. Follow these instructions at home: Self-exams   Do a breast self-exam every month, or as often as directed. A breast self-exam involves: ? Comparing your breasts in the mirror. ? Looking for visible changes in your skin or nipples. ? Feeling for lumps or changes.  Having many breast cysts may make it harder to feel for new lumps. Understand how your breasts normally look and feel, and write down any changes in your breasts. Tell your health care provider about any changes. Eating and drinking  Follow instructions from your health care provider about eating and drinking restrictions.  Drink enough fluid to keep your urine pale yellow.  Avoid caffeine.  Cut down on salt (sodium) in what you eat and drink, especially before your menstrual period. Too much sodium can cause fluid buildup, breast swelling, and discomfort. General instructions  See your health care provider regularly. ? Get a yearly physical exam. ? If you are 29-62 years old, get a clinical breast exam every 1-3 years. After the age of 49 years, get this exam every year. ? Get mammograms as often as  directed.  Take over-the-counter and prescription medicines only as told by your health care provider.  Wear a supportive bra, especially when exercising.  Keep all follow-up visits as told by your health care provider. This is important. Contact a health care provider if:  You feel, or think you feel, a lump in your breast.  You notice that both breasts look or feel different than usual.  Your breast is still causing pain after your menstrual period is over.  You find new lumps or bumps that were not there before.  You feel lumps in your armpit. Get help right away if:  You have severe pain, tenderness, redness, or warmth in your breast.  You have fluid or blood leaking from your nipple.  Your breast lump becomes hard and painful.  You notice dimpling or wrinkling of the breast or nipple. Summary  A breast cyst is a sac in the breast that is filled with fluid.  Treatment may not be needed for this condition.  If the cyst is uncomfortable or gets bigger, or if you do not like how the cyst makes  your breast look, you may need treatment. This information is not intended to replace advice given to you by your health care provider. Make sure you discuss any questions you have with your health care provider. Document Revised: 12/03/2018 Document Reviewed: 12/03/2018 Elsevier Patient Education  2020 [employer].

## 2019-10-14 NOTE — Progress Notes (Signed)
Patient ID: Eileen Tate, female   DOB: 06-13-1986, 34 y.o.   MRN: [id number]  HPI Eileen Tate is a 34 y.o. female seen in consultation at the request of Dr. Corky Downs from the emergency room.  She went last week to the emergency room for a tender right breast.  She reports that she has had the symptoms for the last 10 days and noticed that she has intermittent sharp moderate pain in the right breast.  Sore felt a lump.  She does have a family history significant for 2 aunts with a diagnosis of breast cancer.  She denies any fevers or any chills no previous imaging studies. No nipple discharge. She does smoke daily Recent CBC was normal except hemoglobin of 17.6 and white count of 11.1.  BMP was completely normal. He is able to perform more than 4 METS of activity without any shortness of breath or chest pain. Only operation that she has had was a postpartum tubal ligation.   HPI  Past Medical History:  Diagnosis Date  . Abscess and cellulitis   . Asthma   . Headache   . Heart murmur   . Tobacco use disorder   . UTI (lower urinary tract infection)     Past Surgical History:  Procedure Laterality Date  . NO PAST SURGERIES    . TUBAL LIGATION N/A 05/04/2018   Procedure: POST PARTUM TUBAL LIGATION;  Surgeon: Will Bonnet, MD;  Location: [facility] OR[facility];  Service: Gynecology;  Laterality: N/A;    Family History  Problem Relation Age of Onset  . Cancer Maternal Aunt 19       breast  . Cancer Paternal Aunt 8       breast  . Diabetes Paternal Grandmother   . Heart disease Paternal Grandmother     Social History Social History   Tobacco Use  . Smoking status: Current Every Day Smoker    Packs/day: 0.25    Years: 10.00    Pack years: 2.50    Types: Cigarettes  . Smokeless tobacco: Never Used  Substance Use Topics  . Alcohol use: Yes  . Drug use: No    Allergies  Allergen Reactions  . Guaifenesin & Derivatives Hives and Swelling  . Penicillins Swelling  .  Excedrin Extra Strength [Aspirin-Acetaminophen-Caffeine] Other (See Comments)    Blisters  . Ibuprofen Other (See Comments)    Blisters     Current Outpatient Medications  Medication Sig Dispense Refill  . albuterol (PROVENTIL HFA;VENTOLIN HFA) 108 (90 Base) MCG/ACT inhaler Inhale 2 puffs into the lungs every 6 (six) hours as needed for wheezing or shortness of breath. 1 Inhaler 2  . butalbital-acetaminophen-caffeine (FIORICET) 50-325-40 MG tablet Take 1-2 tablets by mouth every 6 (six) hours as needed for headache. (Patient not taking: Reported on 10/13/2019) 20 tablet 0  . clindamycin (CLEOCIN) 150 MG capsule Take 3 capsules (450 mg total) by mouth 3 (three) times daily. (Patient not taking: Reported on 10/13/2019) 63 capsule 0  . clonazePAM (KLONOPIN) 0.5 MG tablet Take 1 tablet (0.5 mg total) by mouth 2 (two) times daily as needed for anxiety. (Patient not taking: Reported on 10/13/2019) 30 tablet 0  . lidocaine (XYLOCAINE) 2 % solution 10 ml to affected area q 6 hours prn pain (Patient not taking: Reported on 10/13/2019) 100 mL 0  . medroxyPROGESTERone (PROVERA) 10 MG tablet Take 1 tablet (10 mg total) by mouth daily for 10 days. 10 tablet 0  . penicillin v potassium (VEETID) 500 MG  tablet Take 1 tablet (500 mg total) by mouth 3 (three) times daily. (Patient not taking: Reported on 10/13/2019) 30 tablet 0  . predniSONE (STERAPRED UNI-PAK 21 TAB) 10 MG (21) TBPK tablet Take as instructed on package (60, 50, 40, 30, 20, 10) (Patient not taking: Reported on 10/13/2019) 1 each 1  . traMADol (ULTRAM) 50 MG tablet 1 tab po q 8 hours prn (Patient not taking: Reported on 10/13/2019) 8 tablet 0   No current facility-administered medications for this visit.     Review of Systems Full ROS  was asked and was negative except for the information on the HPI  Physical Exam CONSTITUTIONAL: NAD EYES: Pupils are equal, round, and reactive to light, Sclera are non-icteric. EARS, NOSE, MOUTH AND THROAT: The  oropharynx is clear. The oral mucosa is pink and moist. Hearing is intact to voice. LYMPH NODES:  Lymph nodes in the neck are normal. RESPIRATORY:  Lungs are clear. There is normal respiratory effort, with equal breath sounds bilaterally, and without pathologic use of accessory muscles. CARDIOVASCULAR: Heart is regular without murmurs, gallops, or rubs. BREASTS: There is evidence of a tender 2 cm nodule on the right breast upper inner quadrant approximately 2 o'clock 5 cm from the nipple. Exam is limited due to pain.  There is no evidence of any other masses on the contralateral breast.  There is no evidence of lymphadenopathy.  No evidence of cellulitis or abscess GI: The abdomen is soft, nontender, and nondistended. There are no palpable masses. There is no hepatosplenomegaly. There are normal bowel sounds in all quadrants. GU: Rectal deferred.   MUSCULOSKELETAL: Normal muscle strength and tone. No cyanosis or edema.   SKIN: Turgor is good and there are no pathologic skin lesions or ulcers. NEUROLOGIC: Motor and sensation is grossly normal. Cranial nerves are grossly intact. PSYCH:  Oriented to person, place and time. Affect is normal.  Data Reviewed  I have personally reviewed the patient's imaging, laboratory findings and medical records.    Assessment/Plan 34 year old female with a symptomatic left nodule on the breast in need for further characterization by mammography and ultrasound.  Will order appropriate and pertinent studies.  If needed we we will also order ultrasound guided biopsy versus aspiration depending on imaging studies.  I will see her back in a couple weeks.  I do not think that this is an abscess at least not one that I can easily reach here in the office.  There is no need for emergent surgical intervention at this time    Time spent with the patient was 45 minutes, with more than 50% of the time spent in face-to-face education, counseling and care coordination.      Sterling Big, MD FACS General Surgeon 10/14/2019, 1:16 PM

## 2019-10-21 ENCOUNTER — Other Ambulatory Visit: Payer: Medicaid Other

## 2019-10-30 ENCOUNTER — Other Ambulatory Visit: Payer: Medicaid Other

## 2019-12-30 IMAGING — CR DG ABDOMEN 2V
2 series · 2 of 2 positions shown · non-contrast
Comparison: None.

CLINICAL DATA: Left upper quadrant pain, history of 7 weeks
postpartum

EXAM:
ABDOMEN - 2 VIEW

[abdomen erect]
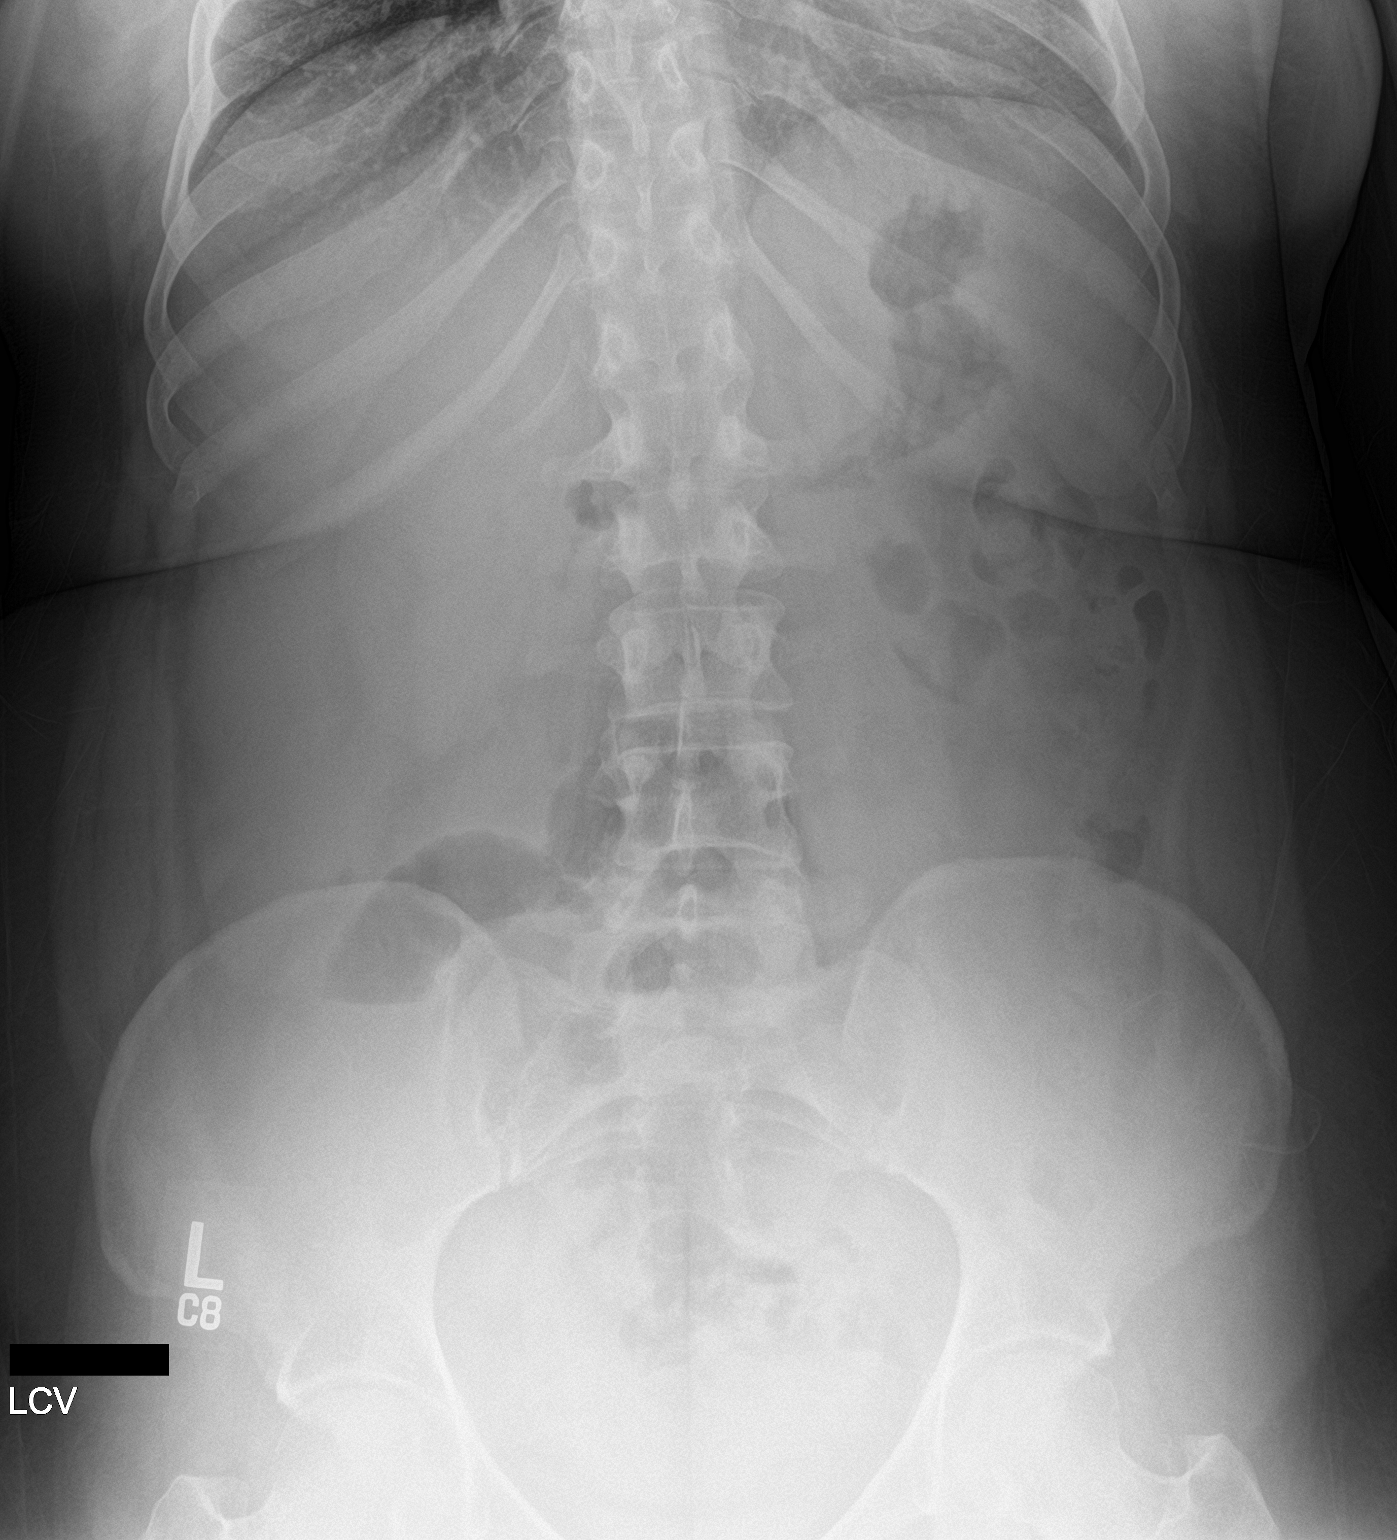

[abdomen supine]
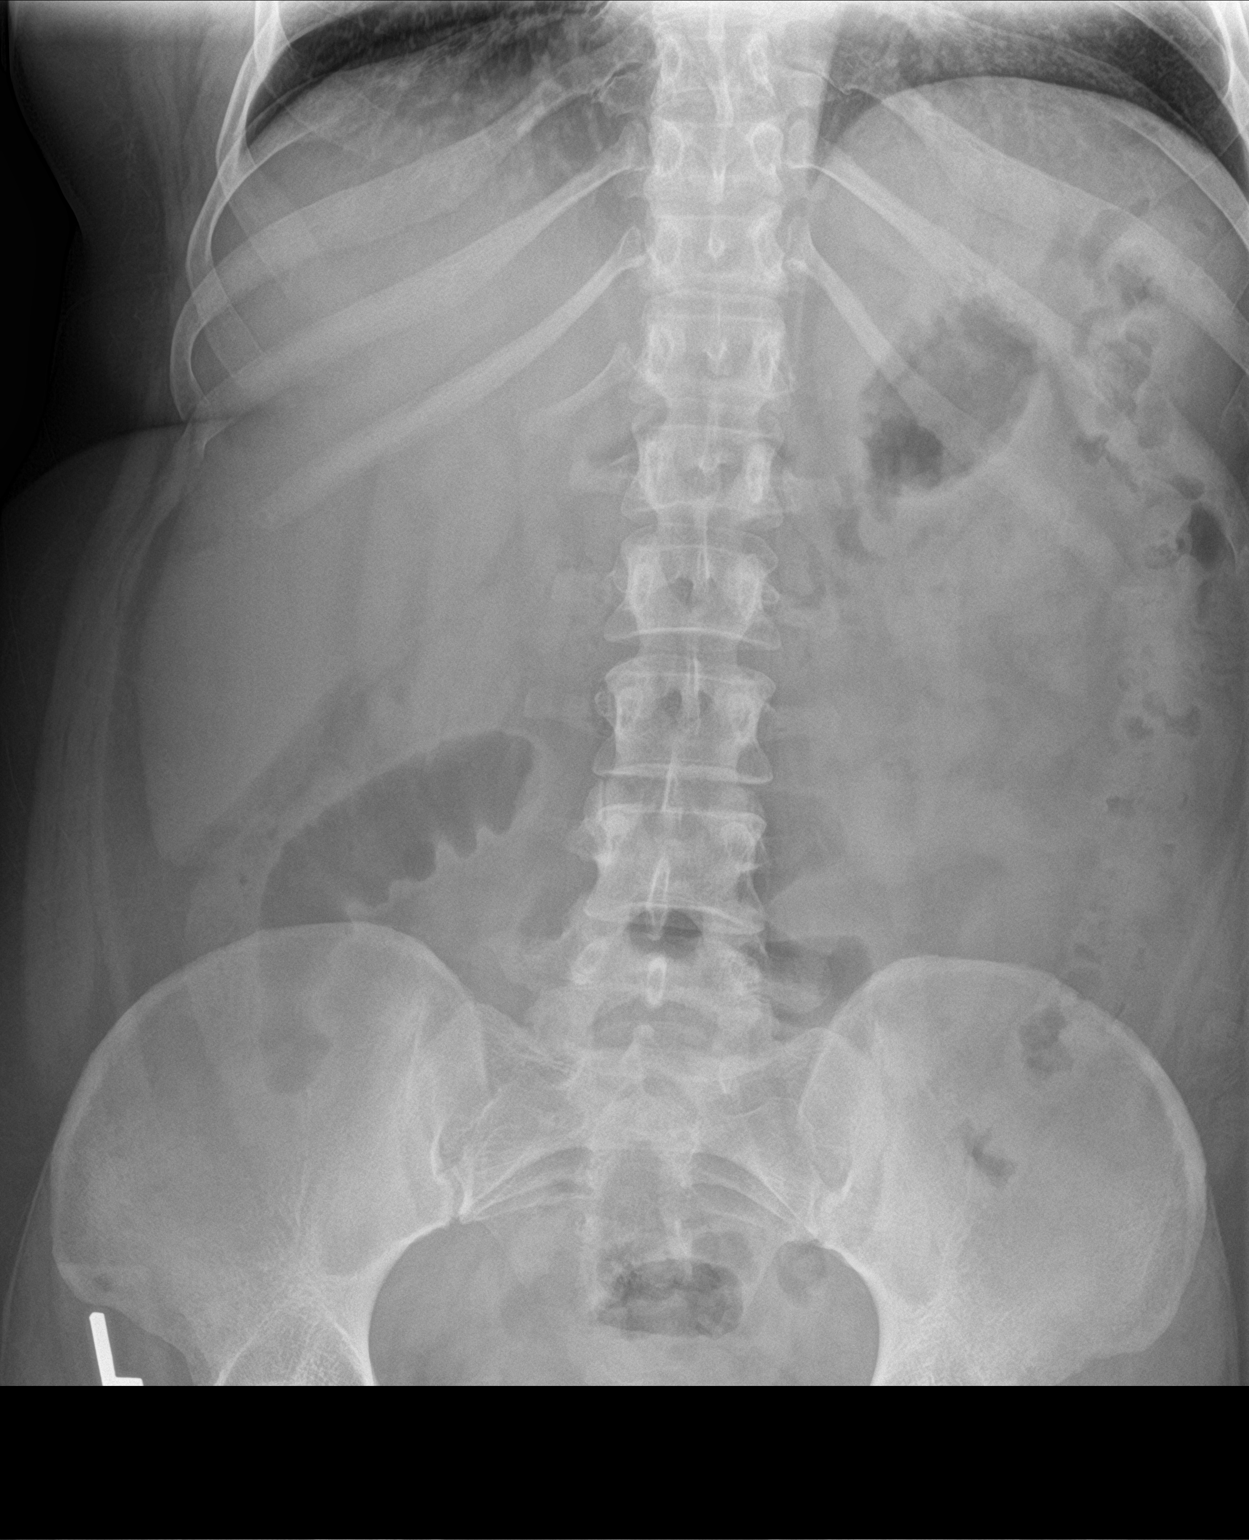

[2 of 2 positions shown; findings below may reference images not displayed]

FINDINGS: Scattered large and small bowel gas is noted. No free air is seen.
No mass or obstructive changes are seen. No bony abnormality is
noted.
IMPRESSION: No acute abnormality noted.

## 2020-09-01 ENCOUNTER — Other Ambulatory Visit: Payer: Self-pay

## 2020-09-01 ENCOUNTER — Ambulatory Visit
Admission: EM | Admit: 2020-09-01 | Discharge: 2020-09-01 | Disposition: A | Discharge status: Home / Self Care | Payer: Medicaid Other | Attending: Sports Medicine | Admitting: Sports Medicine

## 2020-09-01 ENCOUNTER — Encounter: Payer: Self-pay | Admitting: Emergency Medicine

## 2020-09-01 DIAGNOSIS — R051 Acute cough: Secondary | ICD-10-CM | POA: Diagnosis present

## 2020-09-01 DIAGNOSIS — H9202 Otalgia, left ear: Secondary | ICD-10-CM | POA: Diagnosis not present

## 2020-09-01 DIAGNOSIS — Z793 Long term (current) use of hormonal contraceptives: Secondary | ICD-10-CM | POA: Insufficient documentation

## 2020-09-01 DIAGNOSIS — U071 COVID-19: Secondary | ICD-10-CM | POA: Insufficient documentation

## 2020-09-01 DIAGNOSIS — F17210 Nicotine dependence, cigarettes, uncomplicated: Secondary | ICD-10-CM | POA: Diagnosis not present

## 2020-09-01 DIAGNOSIS — J069 Acute upper respiratory infection, unspecified: Secondary | ICD-10-CM

## 2020-09-01 DIAGNOSIS — Z886 Allergy status to analgesic agent status: Secondary | ICD-10-CM | POA: Insufficient documentation

## 2020-09-01 DIAGNOSIS — Z880 Allergy status to penicillin: Secondary | ICD-10-CM | POA: Diagnosis not present

## 2020-09-01 LAB — GROUP A STREP BY PCR: Group A Strep by PCR: NOT DETECTED

## 2020-09-01 MED ORDER — PROMETHAZINE-DM 6.25-15 MG/5ML PO SYRP: 5.0000 mL | ORAL_SOLUTION | Freq: Four times a day (QID) | ORAL | 0 refills | Status: DC | PRN

## 2020-09-01 MED ORDER — BENZONATATE 100 MG PO CAPS: 200.0000 mg | ORAL_CAPSULE | Freq: Three times a day (TID) | ORAL | 0 refills | Status: DC

## 2020-09-01 NOTE — ED Provider Notes (Signed)
[facility]    CSN: [id number] Arrival date & time: 09/01/20  1819      History   Chief Complaint Chief Complaint  Patient presents with  . Cough  . Covid Exposure  . Back Pain    HPI Tynlee Bayle is a 35 y.o. female.   HPI   35 year old female here for evaluation of sore throat, left ear pain, backache, and cough that started 2 days ago.  Patient had a positive COVID exposure 6 days ago.  Patient denies fever, runny nose, shortness of breath or wheezing, or GI complaints.  Patient is not vaccinated againstCOVID.  Past Medical History:  Diagnosis Date  . Abscess and cellulitis   . Asthma   . Headache   . Heart murmur   . Tobacco use disorder   . UTI (lower urinary tract infection)     Patient Active Problem List   Diagnosis Date Noted  . Postpartum care following vaginal delivery 05/04/2018  . Vaginal discharge 04/22/2018  . Urinary tract infection 03/17/2018  . Abdominal pain affecting pregnancy 03/08/2018  . History of asthma 04/27/2015  . LSIL (low grade squamous intraepithelial lesion) on Pap smear 12/04/2012    Past Surgical History:  Procedure Laterality Date  . NO PAST SURGERIES    . TUBAL LIGATION N/A 05/04/2018   Procedure: POST PARTUM TUBAL LIGATION;  Surgeon: Conard Novak, MD;  Location: [facility] ORS;  Service: Gynecology;  Laterality: N/A;    OB History    Gravida  7   Para  7   Term  6   Preterm  1   AB  0   Living  7     SAB  0   IAB  0   Ectopic  0   Multiple  0   Live Births  5            Home Medications    Prior to Admission medications   Medication Sig Start Date End Date Taking? Authorizing Provider  benzonatate (TESSALON) 100 MG capsule Take 2 capsules (200 mg total) by mouth every 8 (eight) hours. 09/01/20  Yes Becky Augusta, NP  promethazine-dextromethorphan (PROMETHAZINE-DM) 6.25-15 MG/5ML syrup Take 5 mLs by mouth 4 (four) times daily as needed. 09/01/20  Yes Becky Augusta, NP   medroxyPROGESTERone (PROVERA) 10 MG tablet Take 1 tablet (10 mg total) by mouth daily for 10 days. 03/13/19 03/23/19  Conard Novak, MD  albuterol (PROVENTIL HFA;VENTOLIN HFA) 108 (90 Base) MCG/ACT inhaler Inhale 2 puffs into the lungs every 6 (six) hours as needed for wheezing or shortness of breath. 05/04/18 09/01/20  Tresea Mall, CNM  clonazePAM (KLONOPIN) 0.5 MG tablet Take 1 tablet (0.5 mg total) by mouth 2 (two) times daily as needed for anxiety. Patient not taking: No sig reported 12/04/18 09/01/20  Tommie Sams, DO    Family History Family History  Problem Relation Age of Onset  . Cancer Maternal Aunt 19       breast  . Cancer Paternal Aunt 32       breast  . Diabetes Paternal Grandmother   . Heart disease Paternal Grandmother     Social History Social History   Tobacco Use  . Smoking status: Current Every Day Smoker    Packs/day: 0.25    Years: 10.00    Pack years: 2.50    Types: Cigarettes  . Smokeless tobacco: Never Used  Vaping Use  . Vaping Use: Never used  Substance Use Topics  .  Alcohol use: Yes  . Drug use: No     Allergies   Guaifenesin & derivatives, Penicillins, Excedrin extra strength [aspirin-acetaminophen-caffeine], and Ibuprofen   Review of Systems Review of Systems  Constitutional: Negative for activity change, appetite change and fever.  HENT: Positive for congestion, ear pain and sore throat. Negative for rhinorrhea.   Respiratory: Positive for cough. Negative for shortness of breath and wheezing.   Gastrointestinal: Negative for diarrhea, nausea and vomiting.  Musculoskeletal: Positive for back pain.  Skin: Negative for rash.  Hematological: Negative.   Psychiatric/Behavioral: Negative.      Physical Exam Triage Vital Signs ED Triage Vitals  Enc Vitals Group     BP --      Pulse --      Resp --      Temp --      Temp src --      SpO2 --      Weight 09/01/20 1929 179 lb 14.3 oz (81.6 kg)     Height 09/01/20 1929 5' (1.524 m)      Head Circumference --      Peak Flow --      Pain Score 09/01/20 1928 4     Pain Loc --      Pain Edu? --      Excl. in GC? --    No data found.  Updated Vital Signs BP (!) 137/98 (BP Location: Right Arm)   Pulse (!) 111   Temp 97.6 F (36.4 C) (Oral)   Resp 18   Ht 5' (1.524 m)   Wt 179 lb 14.3 oz (81.6 kg)   LMP 07/14/2020   SpO2 100%   BMI 35.13 kg/m   Visual Acuity Right Eye Distance:   Left Eye Distance:   Bilateral Distance:    Right Eye Near:   Left Eye Near:    Bilateral Near:     Physical Exam Vitals and nursing note reviewed.  Constitutional:      General: She is not in acute distress.    Appearance: Normal appearance. She is not ill-appearing.  HENT:     Head: Normocephalic and atraumatic.     Right Ear: Tympanic membrane, ear canal and external ear normal.     Left Ear: Tympanic membrane, ear canal and external ear normal.     Nose: Congestion and rhinorrhea present.     Mouth/Throat:     Mouth: Mucous membranes are moist.     Pharynx: Oropharynx is clear. Posterior oropharyngeal erythema present. No oropharyngeal exudate.  Cardiovascular:     Rate and Rhythm: Normal rate and regular rhythm.     Pulses: Normal pulses.     Heart sounds: Normal heart sounds. No murmur heard. No gallop.   Pulmonary:     Effort: Pulmonary effort is normal.     Breath sounds: Normal breath sounds. No wheezing or rhonchi.  Musculoskeletal:     Cervical back: Normal range of motion and neck supple.  Lymphadenopathy:     Cervical: No cervical adenopathy.  Skin:    General: Skin is warm and dry.     Capillary Refill: Capillary refill takes less than 2 seconds.     Findings: No erythema or rash.  Neurological:     General: No focal deficit present.     Mental Status: She is alert and oriented to person, place, and time.  Psychiatric:        Mood and Affect: Mood normal.  Behavior: Behavior normal.        Thought Content: Thought content normal.         Judgment: Judgment normal.      UC Treatments / Results  Labs (all labs ordered are listed, but only abnormal results are displayed) Labs Reviewed  SARS CORONAVIRUS 2 (TAT 6-24 HRS)  GROUP A STREP BY PCR    EKG   Radiology No results found.  Procedures Procedures (including critical care time)  Medications Ordered in UC Medications - No data to display  Initial Impression / Assessment and Plan / UC Course  I have reviewed the triage vital signs and the nursing notes.  Pertinent labs & imaging results that were available during my care of the patient were reviewed by me and considered in my medical decision making (see chart for details).   Is here for evaluation of COVID-like symptoms that began 2 days ago.  Patient did have a COVID exposure through her son who tested +6 days ago.  Physical exam reveals erythematous and edematous nasal mucosa with clear nasal discharge.  Posterior oropharynx is erythematous and injected with clear postnasal drip.  Tonsillar pillars are unremarkable and are free of edema or exudate.  No cervical lymphadenopathy appreciated.  Lungs are clear to auscultation.  Will discharge patient home to isolate pending the results of her Covid test.  Strep PCR also collected at triage.  Will give patient Tessalon Perles and Promethazine DM for cough and congestion. Final Clinical Impressions(s) / UC Diagnoses   Final diagnoses:  Viral URI with cough     Discharge Instructions     Isolate at home pending the results of your Covid test which we are checking tonight.  If you test positive then you are going to have to quarantine for 5 days from the start of your symptoms.  After 5 days you can break quarantine if your symptoms have improved and you have not had a fever for 24 hours.  Use over-the-counter Tylenol and ibuprofen as needed for fever and body aches..  Use the Tessalon Perles during the day as needed for cough and the Promethazine DM cough syrup  at bedtime as needed for cough and congestion.  If you develop shortness of breath-especially at rest, you are unable to speak in full sentences, or is a late sign your lips start turning blue you need to go to the ER for evaluation.    ED Prescriptions    Medication Sig Dispense Auth. Provider   benzonatate (TESSALON) 100 MG capsule Take 2 capsules (200 mg total) by mouth every 8 (eight) hours. 21 capsule Becky Augusta, NP   promethazine-dextromethorphan (PROMETHAZINE-DM) 6.25-15 MG/5ML syrup Take 5 mLs by mouth 4 (four) times daily as needed. 118 mL Becky Augusta, NP     PDMP not reviewed this encounter.   Becky Augusta, NP 09/01/20 1950

## 2020-09-01 NOTE — Discharge Instructions (Addendum)
Isolate at home pending the results of your Covid test which we are checking tonight.  If you test positive then you are going to have to quarantine for 5 days from the start of your symptoms.  After 5 days you can break quarantine if your symptoms have improved and you have not had a fever for 24 hours.  Use over-the-counter Tylenol and ibuprofen as needed for fever and body aches..  Use the Tessalon Perles during the day as needed for cough and the Promethazine DM cough syrup at bedtime as needed for cough and congestion.  If you develop shortness of breath-especially at rest, you are unable to speak in full sentences, or is a late sign your lips start turning blue you need to go to the ER for evaluation.

## 2020-09-01 NOTE — ED Triage Notes (Addendum)
Patient c/o cough, back pain and left ear pain that started 2 days ago. Patient had positive exposure to COVID from her son last Thursday.

## 2020-09-02 LAB — SARS CORONAVIRUS 2 (TAT 6-24 HRS): SARS Coronavirus 2: POSITIVE — AB

## 2020-09-03 ENCOUNTER — Encounter: Payer: Self-pay | Admitting: Physician Assistant

## 2020-09-03 ENCOUNTER — Telehealth: Payer: Self-pay | Admitting: Physician Assistant

## 2020-09-03 DIAGNOSIS — F17200 Nicotine dependence, unspecified, uncomplicated: Secondary | ICD-10-CM | POA: Insufficient documentation

## 2020-09-03 NOTE — Telephone Encounter (Signed)
Called to discuss with patient about COVID-19 symptoms and the use of one of the available treatments for those with mild to moderate Covid symptoms and at a high risk of hospitalization.  Pt appears to qualify for outpatient treatment due to co-morbid conditions and/or a member of an at-risk group in accordance with the FDA Emergency Use Authorization.    Symptom onset: 1/31 Vaccinated: no  Booster? no Immunocompromised? no Qualifiers: BMI>25, asthma, tobacco abuse, high SVI.   Unable to reach pt - left VM with call back #   Cline Crock

## 2020-09-15 ENCOUNTER — Ambulatory Visit
Admission: EM | Admit: 2020-09-15 | Discharge: 2020-09-15 | Disposition: A | Discharge status: Home / Self Care | Payer: Medicaid Other | Attending: Emergency Medicine | Admitting: Emergency Medicine

## 2020-09-15 ENCOUNTER — Other Ambulatory Visit: Payer: Self-pay

## 2020-09-15 ENCOUNTER — Ambulatory Visit (INDEPENDENT_AMBULATORY_CARE_PROVIDER_SITE_OTHER): Payer: Medicaid Other

## 2020-09-15 DIAGNOSIS — R1032 Left lower quadrant pain: Secondary | ICD-10-CM | POA: Diagnosis not present

## 2020-09-15 DIAGNOSIS — R109 Unspecified abdominal pain: Secondary | ICD-10-CM

## 2020-09-15 LAB — URINALYSIS, COMPLETE (UACMP) WITH MICROSCOPIC
Glucose, UA: NEGATIVE mg/dL
Ketones, ur: 40 mg/dL — AB
Nitrite: NEGATIVE
Protein, ur: NEGATIVE mg/dL
Specific Gravity, Urine: >1.03 — ABNORMAL HIGH (ref 1.005–1.030)
pH: 7 (ref 5.0–8.0)

## 2020-09-15 LAB — PREGNANCY, URINE: Preg Test, Ur: NEGATIVE

## 2020-09-15 MED ORDER — PHENAZOPYRIDINE HCL 200 MG PO TABS: 200.0000 mg | ORAL_TABLET | Freq: Three times a day (TID) | ORAL | 0 refills | Status: DC

## 2020-09-15 NOTE — ED Triage Notes (Signed)
Patient states that she has been having abdominal pain since 8pm. States that she feels like her abdomen in contracting. States that she urinated what looked like a "rock" last night. States that she is also having lower back pain.

## 2020-09-15 NOTE — Discharge Instructions (Addendum)
Use over-the-counter Tylenol as needed for pain.  Take the Pyridium every 8 hours to help with pain as well.  This will turn your urine bright red-orange.  If your pain continues, if you start running a fever, or you develop nausea and vomiting and cannot take in fluids or medications go to the ER for evaluation.

## 2020-09-15 NOTE — ED Provider Notes (Signed)
[facility]    CSN: [id number] Arrival date & time: 09/15/20  5188      History   Chief Complaint Chief Complaint  Patient presents with  . Abdominal Pain    HPI Eileen Tate is a 35 y.o. female.   HPI   35 year old female here for evaluation of lower abdomen pain and back pain.  Patient reports that her pain was sudden onset last night at around 8 PM.  She states that she passed what looked like a rock wrapped in mucus when she urinated.  Her pain is mostly on the left side of her abdomen and wraps around to her left and middle back.  Patient has had some associated intermittent diarrhea.  Patient denies fever, painful urination, blood in her urine, urinary urgency or frequency, vaginal discharge, sweats, chills, or nausea.  Patient's last normal menstrual period started on 09/09/2020 and ended 09/13/2020.  Patient has had a tubal ligation.  Past Medical History:  Diagnosis Date  . Abscess and cellulitis   . Asthma   . Headache   . Heart murmur   . Morbid obesity (HCC)   . Tobacco use disorder   . UTI (lower urinary tract infection)     Patient Active Problem List   Diagnosis Date Noted  . Tobacco use disorder   . Morbid obesity (HCC)   . Postpartum care following vaginal delivery 05/04/2018  . Vaginal discharge 04/22/2018  . Urinary tract infection 03/17/2018  . Abdominal pain affecting pregnancy 03/08/2018  . History of asthma 04/27/2015  . LSIL (low grade squamous intraepithelial lesion) on Pap smear 12/04/2012    Past Surgical History:  Procedure Laterality Date  . NO PAST SURGERIES    . TUBAL LIGATION N/A 05/04/2018   Procedure: POST PARTUM TUBAL LIGATION;  Surgeon: Conard Novak, MD;  Location: [facility] ORS;  Service: Gynecology;  Laterality: N/A;    OB History    Gravida  7   Para  7   Term  6   Preterm  1   AB  0   Living  7     SAB  0   IAB  0   Ectopic  0   Multiple  0   Live Births  5            Home  Medications    Prior to Admission medications   Medication Sig Start Date End Date Taking? Authorizing Provider  phenazopyridine (PYRIDIUM) 200 MG tablet Take 1 tablet (200 mg total) by mouth 3 (three) times daily. 09/15/20  Yes Becky Augusta, NP  albuterol (PROVENTIL HFA;VENTOLIN HFA) 108 (90 Base) MCG/ACT inhaler Inhale 2 puffs into the lungs every 6 (six) hours as needed for wheezing or shortness of breath. 05/04/18 09/01/20  Tresea Mall, CNM  clonazePAM (KLONOPIN) 0.5 MG tablet Take 1 tablet (0.5 mg total) by mouth 2 (two) times daily as needed for anxiety. Patient not taking: No sig reported 12/04/18 09/01/20  Tommie Sams, DO  medroxyPROGESTERone (PROVERA) 10 MG tablet Take 1 tablet (10 mg total) by mouth daily for 10 days. 03/13/19 09/15/20  Conard Novak, MD    Family History Family History  Problem Relation Age of Onset  . Cancer Maternal Aunt 19       breast  . Cancer Paternal Aunt 28       breast  . Diabetes Paternal Grandmother   . Heart disease Paternal Grandmother     Social History Social History   Tobacco  Use  . Smoking status: Current Every Day Smoker    Packs/day: 0.25    Years: 10.00    Pack years: 2.50    Types: Cigarettes  . Smokeless tobacco: Never Used  Vaping Use  . Vaping Use: Never used  Substance Use Topics  . Alcohol use: Yes  . Drug use: No     Allergies   Guaifenesin & derivatives, Penicillins, Excedrin extra strength [aspirin-acetaminophen-caffeine], and Ibuprofen   Review of Systems Review of Systems  Constitutional: Negative for activity change, chills, diaphoresis and fever.  Gastrointestinal: Positive for abdominal pain and diarrhea. Negative for constipation, nausea and vomiting.  Genitourinary: Positive for flank pain. Negative for dysuria, frequency, hematuria, menstrual problem, urgency, vaginal bleeding, vaginal discharge and vaginal pain.  Musculoskeletal: Positive for back pain.  Skin: Negative for rash.  Hematological:  Negative.   Psychiatric/Behavioral: Negative.      Physical Exam Triage Vital Signs ED Triage Vitals  Enc Vitals Group     BP 09/15/20 0945 (!) 138/99     Pulse Rate 09/15/20 0945 98     Resp 09/15/20 0945 18     Temp 09/15/20 0945 98.4 F (36.9 C)     Temp Source 09/15/20 0945 Oral     SpO2 09/15/20 0945 98 %     Weight 09/15/20 0942 179 lb 14.3 oz (81.6 kg)     Height 09/15/20 0942 5' (1.524 m)     Head Circumference --      Peak Flow --      Pain Score 09/15/20 0941 10     Pain Loc --      Pain Edu? --      Excl. in GC? --    No data found.  Updated Vital Signs BP (!) 138/99 (BP Location: Left Arm)   Pulse 98   Temp 98.4 F (36.9 C) (Oral)   Resp 18   Ht 5' (1.524 m)   Wt 179 lb 14.3 oz (81.6 kg)   LMP 09/09/2020 Comment: denies preg, preg test neg  SpO2 98%   Breastfeeding No   BMI 35.13 kg/m   Visual Acuity Right Eye Distance:   Left Eye Distance:   Bilateral Distance:    Right Eye Near:   Left Eye Near:    Bilateral Near:     Physical Exam Vitals and nursing note reviewed.  Constitutional:      General: She is not in acute distress.    Appearance: She is well-developed. She is obese. She is not ill-appearing.  HENT:     Head: Normocephalic and atraumatic.  Cardiovascular:     Rate and Rhythm: Normal rate and regular rhythm.     Heart sounds: Normal heart sounds. No murmur heard. No gallop.   Pulmonary:     Effort: Pulmonary effort is normal.     Breath sounds: Normal breath sounds. No wheezing, rhonchi or rales.  Abdominal:     General: Abdomen is protuberant. Bowel sounds are normal. There is no distension.     Palpations: Abdomen is soft. There is no hepatomegaly.     Tenderness: There is abdominal tenderness in the left upper quadrant and left lower quadrant. There is no right CVA tenderness, left CVA tenderness, guarding or rebound.     Hernia: No hernia is present.  Skin:    General: Skin is warm and dry.     Capillary Refill:  Capillary refill takes less than 2 seconds.     Findings: No erythema or rash.  Neurological:     General: No focal deficit present.     Mental Status: She is alert and oriented to person, place, and time.  Psychiatric:        Mood and Affect: Mood normal.        Behavior: Behavior normal.      UC Treatments / Results  Labs (all labs ordered are listed, but only abnormal results are displayed) Labs Reviewed  URINALYSIS, COMPLETE (UACMP) WITH MICROSCOPIC - Abnormal; Notable for the following components:      Result Value   APPearance HAZY (*)    Specific Gravity, Urine >1.030 (*)    Hgb urine dipstick TRACE (*)    Bilirubin Urine SMALL (*)    Ketones, ur 40 (*)    Leukocytes,Ua TRACE (*)    Bacteria, UA RARE (*)    All other components within normal limits  URINE CULTURE  PREGNANCY, URINE    EKG   Radiology DG Abdomen 1 View  Result Date: 09/15/2020 CLINICAL DATA:  Left lower quadrant, left flank pain EXAM: ABDOMEN - 1 VIEW COMPARISON:  None. FINDINGS: The bowel gas pattern is normal. No radio-opaque calculi or other significant radiographic abnormality are seen. IMPRESSION: Negative. Electronically Signed   By: Charlett NoseKevin  Dover M.D.   On: 09/15/2020 11:00    Procedures Procedures (including critical care time)  Medications Ordered in UC Medications - No data to display  Initial Impression / Assessment and Plan / UC Course  I have reviewed the triage vital signs and the nursing notes.  Pertinent labs & imaging results that were available during my care of the patient were reviewed by me and considered in my medical decision making (see chart for details).   Patient is a nontoxic-appearing 35 year old female here for evaluation of abdomen and back pain that started abruptly last night at 8 PM.  Patient is in no acute distress.  She verbalizes no vaginal or urinary complaints other than passing what possibly was a kidney stone last night.  Patient's physical exam reveals a  soft, protuberant abdomen with positive bowel sounds in all 4 quadrants.  Patient does have mild tenderness without guarding or rebound in the left upper and left lower quadrant.  No suprapubic tenderness and no CVA tenderness noted on exam.  Will obtain KUB to look for renal stone and will check UA and urine pregnancy.  Patient did have a tubal ligation 2 years ago and just finished her menses 2 days ago.  Patient reports that her menses only lasted 4 days this time and typically last 6.  Urinalysis shows high specific gravity of >1.030, trace hemoglobin, small bilirubin, 40 ketones, trace leukocytes, and rare bacteria.  There is also mucus present.  Patient has no WBCs or RBCs present.  We will send urine for culture.  Urine pregnancy test is negative.  KUB is negative for renal calculi per radiology read.  Patient's exam and diagnostic findings are benign.  Suspect patient may very well have passed a kidney stone.  Discussed findings with patient as well as treatment options.  I gave her the option of trying to get a CT scan of her abdomen pelvis or treating her pain with Tylenol and Pyridium since she is allergic to aspirin and ibuprofen.  Patient has elected to treat her pain outpatient with the Tylenol and Pyridium.     Final Clinical Impressions(s) / UC Diagnoses   Final diagnoses:  Left lower quadrant abdominal pain     Discharge Instructions  Use over-the-counter Tylenol as needed for pain.  Take the Pyridium every 8 hours type with pain as well.  This will turn your urine bright red-orange.  If your pain continues, if you start running a fever, or you develop nausea and vomiting and cannot take in fluids or medications go to the ER for evaluation.    ED Prescriptions    Medication Sig Dispense Auth. Provider   phenazopyridine (PYRIDIUM) 200 MG tablet Take 1 tablet (200 mg total) by mouth 3 (three) times daily. 6 tablet Becky Augusta, NP     PDMP not reviewed this  encounter.   Becky Augusta, NP 09/15/20 1118

## 2020-09-17 ENCOUNTER — Telehealth (HOSPITAL_COMMUNITY): Payer: Self-pay | Admitting: Emergency Medicine

## 2020-09-17 LAB — URINE CULTURE
Culture: >=100000 — AB
Special Requests: NORMAL

## 2020-09-17 MED ORDER — NITROFURANTOIN MONOHYD MACRO 100 MG PO CAPS: 100.0000 mg | ORAL_CAPSULE | Freq: Two times a day (BID) | ORAL | 0 refills | Status: DC

## 2020-11-11 ENCOUNTER — Other Ambulatory Visit: Payer: Self-pay

## 2020-11-11 ENCOUNTER — Ambulatory Visit
Admission: EM | Admit: 2020-11-11 | Discharge: 2020-11-11 | Disposition: A | Discharge status: Home / Self Care | Payer: Medicaid Other | Attending: Family Medicine | Admitting: Family Medicine

## 2020-11-11 DIAGNOSIS — M7711 Lateral epicondylitis, right elbow: Secondary | ICD-10-CM

## 2020-11-11 MED ORDER — PREDNISONE 10 MG PO TABS: ORAL_TABLET | ORAL | 0 refills | Status: DC

## 2020-11-11 NOTE — ED Provider Notes (Signed)
[facility]    CSN: [id number] Arrival date & time: 11/11/20  0816      History   Chief Complaint Chief Complaint  Patient presents with  . Elbow Pain    Right   . forearm pain   HPI  35 year old female presents with the above complaints.   Patient reports that she has had ongoing pain for the past 2 days.  Located predominantly at the right elbow.  She states that it radiates down the forearm.  She reports her pain is 6/10 in severity.  She recently started a new job.  This may be the culprit for her pain.  Seems to be exacerbated by certain activities.  No other reported symptoms.  No other complaints.  Past Medical History:  Diagnosis Date  . Abscess and cellulitis   . Asthma   . Headache   . Heart murmur   . Morbid obesity (HCC)   . Tobacco use disorder   . UTI (lower urinary tract infection)     Patient Active Problem List   Diagnosis Date Noted  . Tobacco use disorder   . Morbid obesity (HCC)   . Postpartum care following vaginal delivery 05/04/2018  . Vaginal discharge 04/22/2018  . Urinary tract infection 03/17/2018  . Abdominal pain affecting pregnancy 03/08/2018  . History of asthma 04/27/2015  . LSIL (low grade squamous intraepithelial lesion) on Pap smear 12/04/2012    Past Surgical History:  Procedure Laterality Date  . NO PAST SURGERIES    . TUBAL LIGATION N/A 05/04/2018   Procedure: POST PARTUM TUBAL LIGATION;  Surgeon: Conard Novak, MD;  Location: [facility] ORS;  Service: Gynecology;  Laterality: N/A;    OB History    Gravida  7   Para  7   Term  6   Preterm  1   AB  0   Living  7     SAB  0   IAB  0   Ectopic  0   Multiple  0   Live Births  5            Home Medications    Prior to Admission medications   Medication Sig Start Date End Date Taking? Authorizing Provider  predniSONE (DELTASONE) 10 MG tablet 50 mg daily x 2 days, then 40 mg daily x 2 days, then 30 mg daily x 2 days, then 20 mg daily x 2  days, then 10 mg daily x 2 days. 11/11/20  Yes Natonya Finstad G, DO  albuterol (PROVENTIL HFA;VENTOLIN HFA) 108 (90 Base) MCG/ACT inhaler Inhale 2 puffs into the lungs every 6 (six) hours as needed for wheezing or shortness of breath. 05/04/18 09/01/20  Tresea Mall, CNM  clonazePAM (KLONOPIN) 0.5 MG tablet Take 1 tablet (0.5 mg total) by mouth 2 (two) times daily as needed for anxiety. Patient not taking: No sig reported 12/04/18 09/01/20  Tommie Sams, DO  medroxyPROGESTERone (PROVERA) 10 MG tablet Take 1 tablet (10 mg total) by mouth daily for 10 days. 03/13/19 09/15/20  Conard Novak, MD    Family History Family History  Problem Relation Age of Onset  . Cancer Maternal Aunt 19       breast  . Cancer Paternal Aunt 44       breast  . Diabetes Paternal Grandmother   . Heart disease Paternal Grandmother     Social History Social History   Tobacco Use  . Smoking status: Current Every Day Smoker  Packs/day: 0.25    Years: 10.00    Pack years: 2.50    Types: Cigarettes  . Smokeless tobacco: Never Used  Vaping Use  . Vaping Use: Never used  Substance Use Topics  . Alcohol use: Yes    Comment: beer on weekends  . Drug use: No     Allergies   Guaifenesin & derivatives, Penicillins, Excedrin extra strength [aspirin-acetaminophen-caffeine], and Ibuprofen   Review of Systems Review of Systems Per HPI  Physical Exam Triage Vital Signs ED Triage Vitals  Enc Vitals Group     BP 11/11/20 0830 (!) 126/97     Pulse Rate 11/11/20 0830 100     Resp 11/11/20 0830 16     Temp 11/11/20 0830 98.4 F (36.9 C)     Temp Source 11/11/20 0830 Oral     SpO2 11/11/20 0830 99 %     Weight 11/11/20 0833 162 lb 8 oz (73.7 kg)     Height 11/11/20 0833 5' (1.524 m)     Head Circumference --      Peak Flow --      Pain Score 11/11/20 0832 6     Pain Loc --      Pain Edu? --      Excl. in GC? --    Updated Vital Signs BP (!) 126/97 (BP Location: Left Arm)   Pulse 100   Temp 98.4 F  (36.9 C) (Oral)   Resp 16   Ht 5' (1.524 m)   Wt 73.7 kg   LMP 11/06/2020   SpO2 99%   BMI 31.74 kg/m   Visual Acuity Right Eye Distance:   Left Eye Distance:   Bilateral Distance:    Right Eye Near:   Left Eye Near:    Bilateral Near:     Physical Exam Vitals and nursing note reviewed.  Constitutional:      General: She is not in acute distress.    Appearance: Normal appearance. She is not ill-appearing.  HENT:     Head: Normocephalic and atraumatic.  Eyes:     General:        Right eye: No discharge.        Left eye: No discharge.     Conjunctiva/sclera: Conjunctivae normal.  Pulmonary:     Effort: Pulmonary effort is normal. No respiratory distress.  Musculoskeletal:     Comments: Right elbow -tenderness over lateral epicondyle.  Neurological:     Mental Status: She is alert.     UC Treatments / Results  Labs (all labs ordered are listed, but only abnormal results are displayed) Labs Reviewed - No data to display  EKG   Radiology No results found.  Procedures Procedures (including critical care time)  Medications Ordered in UC Medications - No data to display  Initial Impression / Assessment and Plan / UC Course  I have reviewed the triage vital signs and the nursing notes.  Pertinent labs & imaging results that were available during my care of the patient were reviewed by me and considered in my medical decision making (see chart for details).    35 year old female presents with lateral epicondylitis. Treating with prednisone. Advised rest, ice, elevation. Information given regarding Orthopedics. Work note given.   Final Clinical Impressions(s) / UC Diagnoses   Final diagnoses:  Lateral epicondylitis of right elbow     Discharge Instructions     Rest, ice, elevation.  Medication as prescribed.  If does not improve or worsens, see  Orthopedics. [facility] Orthopedics [phone]) OR [facility] [phone]).  Take care  Dr.  Adriana Simas     ED Prescriptions    Medication Sig Dispense Auth. Provider   predniSONE (DELTASONE) 10 MG tablet 50 mg daily x 2 days, then 40 mg daily x 2 days, then 30 mg daily x 2 days, then 20 mg daily x 2 days, then 10 mg daily x 2 days. 30 tablet Tommie Sams, DO     PDMP not reviewed this encounter.   Everlene Other [place], [place] 11/11/20 [phone]

## 2020-11-11 NOTE — Discharge Instructions (Signed)
Rest, ice, elevation.  Medication as prescribed.  If does not improve or worsens, see Orthopedics. [facility] Orthopedics [phone]) OR [facility] [phone]).  Take care  Dr. Adriana Simas

## 2020-11-11 NOTE — ED Triage Notes (Signed)
Patient complains of right elbow pain that started around 2 days. Patient states that pain radiates up to shoulder and down through hand. Reports that pain has been constant.

## 2020-12-19 ENCOUNTER — Ambulatory Visit
Admission: EM | Admit: 2020-12-19 | Discharge: 2020-12-19 | Disposition: A | Discharge status: Home / Self Care | Payer: Medicaid Other | Attending: Emergency Medicine | Admitting: Emergency Medicine

## 2020-12-19 ENCOUNTER — Encounter: Payer: Self-pay | Admitting: Emergency Medicine

## 2020-12-19 ENCOUNTER — Other Ambulatory Visit: Payer: Self-pay

## 2020-12-19 DIAGNOSIS — H66001 Acute suppurative otitis media without spontaneous rupture of ear drum, right ear: Secondary | ICD-10-CM | POA: Diagnosis not present

## 2020-12-19 DIAGNOSIS — H60501 Unspecified acute noninfective otitis externa, right ear: Secondary | ICD-10-CM

## 2020-12-19 MED ORDER — LEVOFLOXACIN 500 MG PO TABS: 500.0000 mg | ORAL_TABLET | Freq: Every day | ORAL | 0 refills | Status: AC

## 2020-12-19 MED ORDER — TRAMADOL HCL 50 MG PO TABS: 50.0000 mg | ORAL_TABLET | Freq: Four times a day (QID) | ORAL | 0 refills | Status: AC | PRN

## 2020-12-19 NOTE — ED Provider Notes (Signed)
[facility]    CSN: [id number] Arrival date & time: 12/19/20  1519      History   Chief Complaint Chief Complaint  Patient presents with  . Otalgia    right    HPI Eileen Tate is a 35 y.o. female.   HPI   35 year old female here for evaluation of right ear pain.  Patient reports that her pain started today suddenly when she woke up.  She denies fever, drainage from her ear, ringing, or upper respiratory symptoms.  [place].  Past Medical History:  Diagnosis Date  . Abscess and cellulitis   . Asthma   . Headache   . Heart murmur   . Morbid obesity (HCC)   . Tobacco use disorder   . UTI (lower urinary tract infection)     Patient Active Problem List   Diagnosis Date Noted  . Tobacco use disorder   . Morbid obesity (HCC)   . Postpartum care following vaginal delivery 05/04/2018  . Vaginal discharge 04/22/2018  . Urinary tract infection 03/17/2018  . Abdominal pain affecting pregnancy 03/08/2018  . History of asthma 04/27/2015  . LSIL (low grade squamous intraepithelial lesion) on Pap smear 12/04/2012    Past Surgical History:  Procedure Laterality Date  . NO PAST SURGERIES    . TUBAL LIGATION N/A 05/04/2018   Procedure: POST PARTUM TUBAL LIGATION;  Surgeon: Conard Novak, MD;  Location: [facility] ORS;  Service: Gynecology;  Laterality: N/A;    OB History    Gravida  7   Para  7   Term  6   Preterm  1   AB  0   Living  7     SAB  0   IAB  0   Ectopic  0   Multiple  0   Live Births  5            Home Medications    Prior to Admission medications   Medication Sig Start Date End Date Taking? Authorizing Provider  levofloxacin (LEVAQUIN) 500 MG tablet Take 1 tablet (500 mg total) by mouth daily. 12/19/20  Yes Becky Augusta, NP  traMADol (ULTRAM) 50 MG tablet Take 1 tablet (50 mg total) by mouth every 6 (six) hours as needed. 12/19/20  Yes Becky Augusta, NP   albuterol (PROVENTIL HFA;VENTOLIN HFA) 108 (90 Base) MCG/ACT inhaler Inhale 2 puffs into the lungs every 6 (six) hours as needed for wheezing or shortness of breath. 05/04/18 09/01/20  Tresea Mall, CNM  clonazePAM (KLONOPIN) 0.5 MG tablet Take 1 tablet (0.5 mg total) by mouth 2 (two) times daily as needed for anxiety. Patient not taking: No sig reported 12/04/18 09/01/20  Tommie Sams, DO  medroxyPROGESTERone (PROVERA) 10 MG tablet Take 1 tablet (10 mg total) by mouth daily for 10 days. 03/13/19 09/15/20  Conard Novak, MD    Family History Family History  Problem Relation Age of Onset  . Cancer Maternal Aunt 19       breast  . Cancer Paternal Aunt 83       breast  . Diabetes Paternal Grandmother   . Heart disease Paternal Grandmother     Social History Social History   Tobacco Use  . Smoking status: Current Every Day Smoker    Packs/day: 0.25    Years: 10.00    Pack years: 2.50    Types: Cigarettes  . Smokeless tobacco: Never Used  Vaping Use  . Vaping Use: Never used  Substance Use Topics  . Alcohol use: Yes    Comment: beer on weekends  . Drug use: No     Allergies   Guaifenesin & derivatives, Penicillins, Excedrin extra strength [aspirin-acetaminophen-caffeine], and Ibuprofen   Review of Systems Review of Systems  Constitutional: Negative for activity change, appetite change and fever.  HENT: Positive for ear pain. Negative for congestion, ear discharge and rhinorrhea.   Respiratory: Negative for cough and shortness of breath.   Skin: Negative for rash.  Neurological: Negative for headaches.  Hematological: Negative.   Psychiatric/Behavioral: Negative.      Physical Exam Triage Vital Signs ED Triage Vitals  Enc Vitals Group     BP 12/19/20 1542 (!) 130/111     Pulse Rate 12/19/20 1542 91     Resp 12/19/20 1542 14     Temp 12/19/20 1542 98.4 F (36.9 C)     Temp Source 12/19/20 1542 Oral     SpO2 12/19/20 1542 100 %     Weight 12/19/20 1541 160 lb  (72.6 kg)     Height 12/19/20 1541 5' (1.524 m)     Head Circumference --      Peak Flow --      Pain Score 12/19/20 1541 9     Pain Loc --      Pain Edu? --      Excl. in GC? --    No data found.  Updated Vital Signs BP (!) 142/100   Pulse 91   Temp 98.4 F (36.9 C) (Oral)   Resp 14   Ht 5' (1.524 m)   Wt 160 lb (72.6 kg)   LMP 11/12/2020 (Approximate)   SpO2 100%   BMI 31.25 kg/m   Visual Acuity Right Eye Distance:   Left Eye Distance:   Bilateral Distance:    Right Eye Near:   Left Eye Near:    Bilateral Near:     Physical Exam Vitals and nursing note reviewed.  Constitutional:      General: She is not in acute distress.    Appearance: Normal appearance. She is not ill-appearing.  HENT:     Head: Normocephalic and atraumatic.     Right Ear: Ear canal and external ear normal.     Left Ear: Tympanic membrane, ear canal and external ear normal.  Skin:    General: Skin is warm and dry.     Capillary Refill: Capillary refill takes less than 2 seconds.     Findings: No erythema or rash.  Neurological:     General: No focal deficit present.     Mental Status: She is alert and oriented to person, place, and time.  Psychiatric:        Mood and Affect: Mood normal.        Behavior: Behavior normal.        Thought Content: Thought content normal.        Judgment: Judgment normal.      UC Treatments / Results  Labs (all labs ordered are listed, but only abnormal results are displayed) Labs Reviewed - No data to display  EKG   Radiology No results found.  Procedures Procedures (including critical care time)  Medications Ordered in UC Medications - No data to display  Initial Impression / Assessment and Plan / UC Course  I have reviewed the triage vital signs and the nursing notes.  Pertinent labs & imaging results that were available during  my care of the patient were reviewed by me and considered in my medical decision making (see chart for  details).   Patient is here for evaluation of right ear pain that started suddenly today.  She was concerned that there might be something in her ear like a bug or other foreign object because she cannot hear out of her ear and its intensely painful.  She denies fever, drainage, or ringing in her ear.  She has no upper respiratory symptoms.  Physical exam reveals an erythematous and injected right tympanic membrane and external auditory canal.  Patient's exam is consistent with both otitis externa and otitis media.  Patient has an allergy to penicillins and cannot take cephalosporins.  We will place patient on Levaquin 5 mg once daily for 7 days.  Patient reports that Tylenol is not helping her pain so we will give tramadol to help bridge her until the antibiotic takes effect.   Final Clinical Impressions(s) / UC Diagnoses   Final diagnoses:  Non-recurrent acute suppurative otitis media of right ear without spontaneous rupture of tympanic membrane  Acute otitis externa of right ear, unspecified type     Discharge Instructions     Take the Levaquin once daily by mouth for 7 days for treatment of your right ear infection.  Continue use Tylenol as needed for mild to moderate pain and use the tramadol as needed for severe pain.  You can have 1 tramadol every 6 hours as needed for severe pain.  Do not drink alcohol or drive if you take it.  Return for reevaluation for any new or worsening symptoms or see your primary care provider.    ED Prescriptions    Medication Sig Dispense Auth. Provider   levofloxacin (LEVAQUIN) 500 MG tablet Take 1 tablet (500 mg total) by mouth daily. 7 tablet Becky Augusta, NP   traMADol (ULTRAM) 50 MG tablet Take 1 tablet (50 mg total) by mouth every 6 (six) hours as needed. 15 tablet Becky Augusta, NP     I have reviewed the PDMP during this encounter.   Becky Augusta, NP 12/19/20 [phone]

## 2020-12-19 NOTE — ED Triage Notes (Signed)
Patient c/o right ear pain that started today.  Patient not sure if she has something stuck in her right ear like a bug.  Patient denies fevers.

## 2020-12-19 NOTE — Discharge Instructions (Addendum)
Take the Levaquin once daily by mouth for 7 days for treatment of your right ear infection.  Continue use Tylenol as needed for mild to moderate pain and use the tramadol as needed for severe pain.  You can have 1 tramadol every 6 hours as needed for severe pain.  Do not drink alcohol or drive if you take it.  Return for reevaluation for any new or worsening symptoms or see your primary care provider.
